# Patient Record
Sex: Female | Born: 1980 | Race: Black or African American | Hispanic: No | Marital: Single | Smoking: Never smoker
Health system: Southern US, Community
[De-identification: ages and names within clinical notes are randomized; demographics above are authoritative.]

## PROBLEM LIST (undated history)

## (undated) DIAGNOSIS — D219 Benign neoplasm of connective and other soft tissue, unspecified: Secondary | ICD-10-CM

## (undated) DIAGNOSIS — F419 Anxiety disorder, unspecified: Secondary | ICD-10-CM

## (undated) DIAGNOSIS — I10 Essential (primary) hypertension: Secondary | ICD-10-CM

## (undated) DIAGNOSIS — E119 Type 2 diabetes mellitus without complications: Secondary | ICD-10-CM

## (undated) DIAGNOSIS — F329 Major depressive disorder, single episode, unspecified: Secondary | ICD-10-CM

## (undated) DIAGNOSIS — N939 Abnormal uterine and vaginal bleeding, unspecified: Secondary | ICD-10-CM

## (undated) HISTORY — DX: Anxiety disorder, unspecified: F41.9

## (undated) HISTORY — DX: Essential (primary) hypertension: I10

## (undated) HISTORY — DX: Abnormal uterine and vaginal bleeding, unspecified: N93.9

## (undated) HISTORY — DX: Benign neoplasm of connective and other soft tissue, unspecified: D21.9

---

## 2002-10-04 ENCOUNTER — Emergency Department (HOSPITAL_COMMUNITY): Admission: EM | Admit: 2002-10-04 | Discharge: 2002-10-05 | Payer: Self-pay | Admitting: Emergency Medicine

## 2002-10-05 ENCOUNTER — Encounter: Payer: Self-pay | Admitting: Emergency Medicine

## 2004-03-01 ENCOUNTER — Emergency Department (HOSPITAL_COMMUNITY): Admission: EM | Admit: 2004-03-01 | Discharge: 2004-03-01 | Payer: Self-pay | Admitting: Emergency Medicine

## 2008-09-22 ENCOUNTER — Emergency Department (HOSPITAL_COMMUNITY): Admission: EM | Admit: 2008-09-22 | Discharge: 2008-09-22 | Payer: Self-pay | Admitting: Emergency Medicine

## 2009-05-22 ENCOUNTER — Emergency Department (HOSPITAL_COMMUNITY): Admission: EM | Admit: 2009-05-22 | Discharge: 2009-05-22 | Payer: Self-pay | Admitting: Emergency Medicine

## 2010-03-10 ENCOUNTER — Emergency Department (HOSPITAL_COMMUNITY): Admission: EM | Admit: 2010-03-10 | Discharge: 2010-03-10 | Payer: Self-pay | Admitting: Emergency Medicine

## 2010-09-20 LAB — URINALYSIS, ROUTINE W REFLEX MICROSCOPIC
Glucose, UA: NEGATIVE mg/dL
Ketones, ur: NEGATIVE mg/dL
Nitrite: NEGATIVE
Protein, ur: NEGATIVE mg/dL

## 2010-09-20 LAB — ETHANOL: Alcohol, Ethyl (B): <5 mg/dL (ref 0–10)

## 2010-09-20 LAB — DIFFERENTIAL
Basophils Relative: 1 % (ref 0–1)
Eosinophils Absolute: 0.5 10*3/uL (ref 0.0–0.7)
Lymphs Abs: 2.3 10*3/uL (ref 0.7–4.0)
Monocytes Absolute: 0.4 10*3/uL (ref 0.1–1.0)
Monocytes Relative: 3 % (ref 3–12)
Neutro Abs: 7.9 10*3/uL — ABNORMAL HIGH (ref 1.7–7.7)

## 2010-09-20 LAB — COMPREHENSIVE METABOLIC PANEL
ALT: 18 U/L (ref 0–35)
Albumin: 3.8 g/dL (ref 3.5–5.2)
Alkaline Phosphatase: 80 U/L (ref 39–117)
Calcium: 9 mg/dL (ref 8.4–10.5)
Potassium: 3.8 mEq/L (ref 3.5–5.1)
Sodium: 140 mEq/L (ref 135–145)
Total Protein: 7.1 g/dL (ref 6.0–8.3)

## 2010-09-20 LAB — RAPID URINE DRUG SCREEN, HOSP PERFORMED
Amphetamines: NOT DETECTED
Benzodiazepines: NOT DETECTED
Cocaine: NOT DETECTED
Tetrahydrocannabinol: NOT DETECTED

## 2010-09-20 LAB — URINE MICROSCOPIC-ADD ON

## 2010-09-20 LAB — CK TOTAL AND CKMB (NOT AT ARMC): CK, MB: 1.1 ng/mL (ref 0.3–4.0)

## 2010-09-20 LAB — TROPONIN I: Troponin I: 0.01 ng/mL (ref 0.00–0.06)

## 2010-09-20 LAB — CBC
Platelets: 319 10*3/uL (ref 150–400)
RDW: 15.1 % (ref 11.5–15.5)

## 2010-09-20 LAB — POCT PREGNANCY, URINE: Preg Test, Ur: NEGATIVE

## 2011-04-16 ENCOUNTER — Encounter: Payer: Self-pay | Admitting: *Deleted

## 2011-04-16 ENCOUNTER — Emergency Department (HOSPITAL_COMMUNITY): Payer: PRIVATE HEALTH INSURANCE

## 2011-04-16 ENCOUNTER — Emergency Department (HOSPITAL_COMMUNITY)
Admission: EM | Admit: 2011-04-16 | Discharge: 2011-04-16 | Disposition: A | Discharge status: Home / Self Care | Payer: PRIVATE HEALTH INSURANCE | Attending: Emergency Medicine | Admitting: Emergency Medicine

## 2011-04-16 DIAGNOSIS — K5900 Constipation, unspecified: Secondary | ICD-10-CM | POA: Insufficient documentation

## 2011-04-16 DIAGNOSIS — K7689 Other specified diseases of liver: Secondary | ICD-10-CM | POA: Insufficient documentation

## 2011-04-16 DIAGNOSIS — R109 Unspecified abdominal pain: Secondary | ICD-10-CM | POA: Insufficient documentation

## 2011-04-16 DIAGNOSIS — R10819 Abdominal tenderness, unspecified site: Secondary | ICD-10-CM | POA: Insufficient documentation

## 2011-04-16 LAB — COMPREHENSIVE METABOLIC PANEL
ALT: 14 U/L (ref 0–35)
AST: 19 U/L (ref 0–37)
CO2: 25 mEq/L (ref 19–32)
GFR calc non Af Amer: 81 mL/min — ABNORMAL LOW (ref >90)
Potassium: 4.3 mEq/L (ref 3.5–5.1)
Total Bilirubin: 0.2 mg/dL — ABNORMAL LOW (ref 0.3–1.2)

## 2011-04-16 LAB — DIFFERENTIAL
Basophils Absolute: 0 10*3/uL (ref 0.0–0.1)
Eosinophils Absolute: 0.2 10*3/uL (ref 0.0–0.7)
Lymphs Abs: 3.5 10*3/uL (ref 0.7–4.0)
Monocytes Absolute: 0.7 10*3/uL (ref 0.1–1.0)
Monocytes Relative: 6 % (ref 3–12)
Neutro Abs: 7.1 10*3/uL (ref 1.7–7.7)

## 2011-04-16 LAB — CBC
HCT: 37.5 % (ref 36.0–46.0)
MCH: 23.8 pg — ABNORMAL LOW (ref 26.0–34.0)
MCHC: 34.7 g/dL (ref 30.0–36.0)
MCV: 68.6 fL — ABNORMAL LOW (ref 78.0–100.0)
Platelets: 310 10*3/uL (ref 150–400)
RDW: 15.5 % (ref 11.5–15.5)
WBC: 11.5 10*3/uL — ABNORMAL HIGH (ref 4.0–10.5)

## 2011-04-16 LAB — URINALYSIS, ROUTINE W REFLEX MICROSCOPIC
Glucose, UA: NEGATIVE mg/dL
Ketones, ur: NEGATIVE mg/dL
pH: 6 (ref 5.0–8.0)

## 2011-04-16 LAB — URINE MICROSCOPIC-ADD ON

## 2011-04-16 MED ORDER — RANITIDINE HCL 150 MG PO CAPS: 150.0000 mg | ORAL_CAPSULE | Freq: Two times a day (BID) | ORAL | Status: DC

## 2011-04-16 NOTE — ED Notes (Signed)
Pt offered pain medication pt states she does not want anything for pain medication at this time. Pt states pain increases with movement pt states she wants to wait on pain medication

## 2011-04-16 NOTE — ED Notes (Signed)
Intermittent burning sensation sometimes worse after eating. Pain was sharper this am, associated with some nausea that made pt come to ED today.

## 2011-04-16 NOTE — ED Notes (Signed)
Pt states she is unable to void at this time. Pt refused in and cath. Pt states she wants to wait to see if she can void on her own

## 2011-04-16 NOTE — ED Provider Notes (Signed)
History     CSN: [id number] Arrival date & time: 04/16/2011  8:47 AM   First MD Initiated Contact with Patient 04/16/11 [phone]      Chief Complaint  Patient presents with  . Abdominal Pain    (Consider location/radiation/quality/duration/timing/severity/associated sxs/prior treatment) Patient is a 30 y.o. female presenting with abdominal pain. The history is provided by the patient (pt complains of abd pain with bloating).  Abdominal Pain The primary symptoms of the illness include abdominal pain. The primary symptoms of the illness do not include fever, fatigue or diarrhea. The current episode started more than 2 days ago. The onset of the illness was gradual. The problem has not changed since onset. The abdominal pain began more than 2 days ago. The pain came on gradually. The abdominal pain has been unchanged since its onset. The abdominal pain does not radiate. The severity of the abdominal pain is 2/10. The abdominal pain is relieved by nothing. The abdominal pain is exacerbated by fatty foods.  The patient states that she believes she is currently not pregnant. The patient has had a change in bowel habit. Additional symptoms associated with the illness include constipation. Symptoms associated with the illness do not include chills, heartburn, hematuria, frequency or back pain. Significant associated medical issues do not include diverticulitis or HIV.    History reviewed. No pertinent past medical history.  History reviewed. No pertinent past surgical history.  No family history on file.  History  Substance Use Topics  . Smoking status: Never Smoker   . Smokeless tobacco: Not on file  . Alcohol Use: No    OB History    Grav Para Term Preterm Abortions TAB SAB Ect Mult Living                  Review of Systems  Constitutional: Negative for fever, chills and fatigue.  HENT: Negative for congestion, sinus pressure and ear discharge.   Eyes: Negative for discharge.    Respiratory: Negative for cough.   Cardiovascular: Negative for chest pain.  Gastrointestinal: Positive for abdominal pain and constipation. Negative for heartburn and diarrhea.  Genitourinary: Negative for frequency and hematuria.  Musculoskeletal: Negative for back pain.  Skin: Negative for rash.  Neurological: Negative for seizures and headaches.  Hematological: Negative.   Psychiatric/Behavioral: Negative for hallucinations.    Allergies  Shellfish allergy  Home Medications   Current Outpatient Rx  Name Route Sig Dispense Refill  . ACETAMINOPHEN 500 MG PO TABS Oral Take 1,000 mg by mouth every 6 (six) hours as needed. For pain     . NAPROXEN SODIUM 220 MG PO TABS Oral Take 220 mg by mouth daily as needed. For pain     . RANITIDINE HCL 150 MG PO CAPS Oral Take 1 capsule (150 mg total) by mouth 2 (two) times daily. 60 capsule 0    BP 137/57  Pulse 99  Temp(Src) 98.6 F (37 C) (Oral)  Resp 16  SpO2 98%  LMP 04/02/2011  Physical Exam  Constitutional: She is oriented to person, place, and time. She appears well-developed.  HENT:  Head: Normocephalic and atraumatic.  Eyes: Conjunctivae and EOM are normal. No scleral icterus.  Neck: Neck supple. No thyromegaly present.  Cardiovascular: Normal rate and regular rhythm.  Exam reveals no gallop and no friction rub.   No murmur heard. Pulmonary/Chest: No stridor. She has no wheezes. She has no rales. She exhibits no tenderness.  Abdominal: She exhibits no distension. There is tenderness. There is no  rebound.       Mild epigastric and ruq tendernous  Musculoskeletal: Normal range of motion. She exhibits no edema.  Lymphadenopathy:    She has no cervical adenopathy.  Neurological: She is oriented to person, place, and time. Coordination normal.  Skin: No rash noted. No erythema.  Psychiatric: She has a normal mood and affect. Her behavior is normal.    ED Course  Procedures (including critical care time)  Labs Reviewed   CBC - Abnormal; Notable for the following:    WBC 11.5 (*)    RBC 5.47 (*)    MCV 68.6 (*)    MCH 23.8 (*)    All other components within normal limits  COMPREHENSIVE METABOLIC PANEL - Abnormal; Notable for the following:    Glucose, Bld 104 (*)    Albumin 3.4 (*)    Total Bilirubin 0.2 (*)    GFR calc non Af Amer 81 (*)    All other components within normal limits  URINALYSIS, ROUTINE W REFLEX MICROSCOPIC - Abnormal; Notable for the following:    Leukocytes, UA SMALL (*)    All other components within normal limits  URINE MICROSCOPIC-ADD ON - Abnormal; Notable for the following:    Squamous Epithelial / LPF FEW (*)    Bacteria, UA MANY (*)    All other components within normal limits  DIFFERENTIAL  LIPASE, BLOOD  PREGNANCY, URINE   US Abdomen Complete  04/16/2011  *RADIOLOGY REPORT*  Clinical Data:  Abdominal pain.  COMPLETE ABDOMINAL ULTRASOUND 04/16/2011:  Comparison:  None.  Findings: Examination was technically difficult due to body habitus.  Gallbladder:  No shadowing gallstones or echogenic sludge.  No gallbladder wall thickening or pericholecystic fluid.  Negative sonographic Murphy's sign according to the ultrasound technologist.  Common bile duct:  Normal in caliber with maximum diameter approximating 5 mm.  Liver:  Scattered areas of focally increased and coarsened echotexture without other focal parenchymal abnormality.  Patent portal vein with hepatopetal flow.  IVC:  Patent in its intrahepatic portion.  Obscured outside the liver by overlying bowel gas.  Pancreas:  Possible mild pancreatic ductal dilation.  Limited visualization of the pancreas, as the head, distal body, and tail were obscured by bowel gas.  Spleen:  Normal size and echotexture without focal parenchymal abnormality.  Right Kidney:  No hydronephrosis.  Well-preserved cortex.  No shadowing calculi.  Normal size and parenchymal echotexture without focal abnormalities.  Approximately 10.8 cm in length.  Left  Kidney:  No hydronephrosis.  Well-preserved cortex.  No shadowing calculi.  Normal size and parenchymal echotexture without focal abnormalities.  Approximately 12.1 cm in length.  Abdominal aorta:  Normal in caliber throughout its visualized course in the abdomen without significant atherosclerosis.  IMPRESSION:  1.  Scattered areas of focal hepatic steatosis. 2.  Normal-appearing gallbladder by ultrasound.  No biliary ductal dilation. 3.  Non-visualization of the pancreatic head, distal body, and tail.  Possible pancreatic ductal dilation.  Original Report Authenticated By: Arnell Sieving, M.D.   Dg Abd Acute W/chest  04/16/2011  *RADIOLOGY REPORT*  Clinical Data: Abdominal pain.  ACUTE ABDOMEN SERIES (ABDOMEN 2 VIEW & CHEST 1 VIEW)  Comparison: None  Findings: The upright chest x-ray is normal.  No acute pulmonary findings.  Two views of the abdomen demonstrate a normal bowel gas pattern. No findings for obstruction or perforation.  The soft tissue shadows are maintained.  No worrisome calcifications.  The bony structures are unremarkable.  IMPRESSION:  1.  No acute cardiopulmonary  findings. 2.  Unremarkable abdominal radiographs.  Original Report Authenticated By: P. Loralie Champagne, M.D.     1. Abdominal pain    Results for orders placed during the hospital encounter of 04/16/11  CBC      Component Value Range   WBC 11.5 (*) 4.0 - 10.5 (K/uL)   RBC 5.47 (*) 3.87 - 5.11 (MIL/uL)   Hemoglobin 13.0  12.0 - 15.0 (g/dL)   HCT 86.5  78.4 - 69.6 (%)   MCV 68.6 (*) 78.0 - 100.0 (fL)   MCH 23.8 (*) 26.0 - 34.0 (pg)   MCHC 34.7  30.0 - 36.0 (g/dL)   RDW 29.5  28.4 - 13.2 (%)   Platelets 310  150 - 400 (K/uL)  DIFFERENTIAL      Component Value Range   Neutrophils Relative 62  43 - 77 (%)   Lymphocytes Relative 30  12 - 46 (%)   Monocytes Relative 6  3 - 12 (%)   Eosinophils Relative 2  0 - 5 (%)   Basophils Relative 0  0 - 1 (%)   Neutro Abs 7.1  1.7 - 7.7 (K/uL)   Lymphs Abs 3.5  0.7 - 4.0  (K/uL)   Monocytes Absolute 0.7  0.1 - 1.0 (K/uL)   Eosinophils Absolute 0.2  0.0 - 0.7 (K/uL)   Basophils Absolute 0.0  0.0 - 0.1 (K/uL)   RBC Morphology POLYCHROMASIA PRESENT     Smear Review PLATELET COUNT CONFIRMED BY SMEAR    COMPREHENSIVE METABOLIC PANEL      Component Value Range   Sodium 137  135 - 145 (mEq/L)   Potassium 4.3  3.5 - 5.1 (mEq/L)   Chloride 103  96 - 112 (mEq/L)   CO2 25  19 - 32 (mEq/L)   Glucose, Bld 104 (*) 70 - 99 (mg/dL)   BUN 17  6 - 23 (mg/dL)   Creatinine, Ser 4.40  0.50 - 1.10 (mg/dL)   Calcium 8.8  8.4 - 10.2 (mg/dL)   Total Protein 7.2  6.0 - 8.3 (g/dL)   Albumin 3.4 (*) 3.5 - 5.2 (g/dL)   AST 19  0 - 37 (U/L)   ALT 14  0 - 35 (U/L)   Alkaline Phosphatase 97  39 - 117 (U/L)   Total Bilirubin 0.2 (*) 0.3 - 1.2 (mg/dL)   GFR calc non Af Amer 81 (*) >90 (mL/min)   GFR calc Af Amer >90  >90 (mL/min)  LIPASE, BLOOD      Component Value Range   Lipase 13  11 - 59 (U/L)  PREGNANCY, URINE      Component Value Range   Preg Test, Ur NEGATIVE    URINALYSIS, ROUTINE W REFLEX MICROSCOPIC      Component Value Range   Color, Urine YELLOW  YELLOW    Appearance CLEAR  CLEAR    Specific Gravity, Urine 1.026  1.005 - 1.030    pH 6.0  5.0 - 8.0    Glucose, UA NEGATIVE  NEGATIVE (mg/dL)   Hgb urine dipstick NEGATIVE  NEGATIVE    Bilirubin Urine NEGATIVE  NEGATIVE    Ketones, ur NEGATIVE  NEGATIVE (mg/dL)   Protein, ur NEGATIVE  NEGATIVE (mg/dL)   Urobilinogen, UA 0.2  0.0 - 1.0 (mg/dL)   Nitrite NEGATIVE  NEGATIVE    Leukocytes, UA SMALL (*) NEGATIVE   URINE MICROSCOPIC-ADD ON      Component Value Range   Squamous Epithelial / LPF FEW (*) RARE    WBC, UA  7-10  <3 (WBC/hpf)   Bacteria, UA MANY (*) RARE    US Abdomen Complete  04/16/2011  *RADIOLOGY REPORT*  Clinical Data:  Abdominal pain.  COMPLETE ABDOMINAL ULTRASOUND 04/16/2011:  Comparison:  None.  Findings: Examination was technically difficult due to body habitus.  Gallbladder:  No shadowing  gallstones or echogenic sludge.  No gallbladder wall thickening or pericholecystic fluid.  Negative sonographic Murphy's sign according to the ultrasound technologist.  Common bile duct:  Normal in caliber with maximum diameter approximating 5 mm.  Liver:  Scattered areas of focally increased and coarsened echotexture without other focal parenchymal abnormality.  Patent portal vein with hepatopetal flow.  IVC:  Patent in its intrahepatic portion.  Obscured outside the liver by overlying bowel gas.  Pancreas:  Possible mild pancreatic ductal dilation.  Limited visualization of the pancreas, as the head, distal body, and tail were obscured by bowel gas.  Spleen:  Normal size and echotexture without focal parenchymal abnormality.  Right Kidney:  No hydronephrosis.  Well-preserved cortex.  No shadowing calculi.  Normal size and parenchymal echotexture without focal abnormalities.  Approximately 10.8 cm in length.  Left Kidney:  No hydronephrosis.  Well-preserved cortex.  No shadowing calculi.  Normal size and parenchymal echotexture without focal abnormalities.  Approximately 12.1 cm in length.  Abdominal aorta:  Normal in caliber throughout its visualized course in the abdomen without significant atherosclerosis.  IMPRESSION:  1.  Scattered areas of focal hepatic steatosis. 2.  Normal-appearing gallbladder by ultrasound.  No biliary ductal dilation. 3.  Non-visualization of the pancreatic head, distal body, and tail.  Possible pancreatic ductal dilation.  Original Report Authenticated By: Arnell Sieving, M.D.   Dg Abd Acute W/chest  04/16/2011  *RADIOLOGY REPORT*  Clinical Data: Abdominal pain.  ACUTE ABDOMEN SERIES (ABDOMEN 2 VIEW & CHEST 1 VIEW)  Comparison: None  Findings: The upright chest x-ray is normal.  No acute pulmonary findings.  Two views of the abdomen demonstrate a normal bowel gas pattern. No findings for obstruction or perforation.  The soft tissue shadows are maintained.  No worrisome  calcifications.  The bony structures are unremarkable.  IMPRESSION:  1.  No acute cardiopulmonary findings. 2.  Unremarkable abdominal radiographs.  Original Report Authenticated By: P. Loralie Champagne, M.D.       MDM  abd pain.  Pud,  Irritable bowel,  stress        Benny Lennert, MD 04/17/11 [phone]

## 2011-04-16 NOTE — ED Notes (Signed)
Pt states she is having trouble having bowel movements

## 2011-04-16 NOTE — ED Notes (Signed)
Ultrasound called to confrim pt's abd ultrasound. Ultrasound is behind due to biospy being performed at this time

## 2011-04-16 NOTE — ED Notes (Signed)
Pt remains unable to urinate.  

## 2011-09-24 ENCOUNTER — Emergency Department (HOSPITAL_COMMUNITY): Payer: PRIVATE HEALTH INSURANCE

## 2011-09-24 ENCOUNTER — Emergency Department (HOSPITAL_COMMUNITY)
Admission: EM | Admit: 2011-09-24 | Discharge: 2011-09-24 | Disposition: A | Discharge status: Home / Self Care | Payer: PRIVATE HEALTH INSURANCE | Attending: Emergency Medicine | Admitting: Emergency Medicine

## 2011-09-24 ENCOUNTER — Encounter (HOSPITAL_COMMUNITY): Payer: Self-pay | Admitting: *Deleted

## 2011-09-24 DIAGNOSIS — R110 Nausea: Secondary | ICD-10-CM | POA: Insufficient documentation

## 2011-09-24 DIAGNOSIS — H539 Unspecified visual disturbance: Secondary | ICD-10-CM | POA: Insufficient documentation

## 2011-09-24 DIAGNOSIS — E119 Type 2 diabetes mellitus without complications: Secondary | ICD-10-CM | POA: Insufficient documentation

## 2011-09-24 DIAGNOSIS — R51 Headache: Secondary | ICD-10-CM | POA: Insufficient documentation

## 2011-09-24 DIAGNOSIS — R42 Dizziness and giddiness: Secondary | ICD-10-CM | POA: Insufficient documentation

## 2011-09-24 DIAGNOSIS — R350 Frequency of micturition: Secondary | ICD-10-CM | POA: Insufficient documentation

## 2011-09-24 HISTORY — DX: Major depressive disorder, single episode, unspecified: F32.9

## 2011-09-24 LAB — GLUCOSE, CAPILLARY
Glucose-Capillary: 287 mg/dL — ABNORMAL HIGH (ref 70–99)
Glucose-Capillary: 286 mg/dL — ABNORMAL HIGH (ref 70–99)

## 2011-09-24 LAB — URINALYSIS, ROUTINE W REFLEX MICROSCOPIC
Glucose, UA: 500 mg/dL — AB
Leukocytes, UA: NEGATIVE
Nitrite: NEGATIVE
Protein, ur: NEGATIVE mg/dL
Urobilinogen, UA: 0.2 mg/dL (ref 0.0–1.0)

## 2011-09-24 LAB — BASIC METABOLIC PANEL
CO2: 23 mEq/L (ref 19–32)
Calcium: 8.8 mg/dL (ref 8.4–10.5)
GFR calc Af Amer: >90 mL/min (ref >90)
GFR calc non Af Amer: >90 mL/min (ref >90)
Sodium: 135 mEq/L (ref 135–145)

## 2011-09-24 LAB — PREGNANCY, URINE: Preg Test, Ur: NEGATIVE

## 2011-09-24 MED ORDER — SODIUM CHLORIDE 0.9 % IV BOLUS (SEPSIS)
1000.0000 mL | Freq: Once | INTRAVENOUS | Status: AC
  Administered 2011-09-24: 1000 mL via INTRAVENOUS

## 2011-09-24 MED ORDER — LORATADINE 10 MG PO TABS: 10.0000 mg | ORAL_TABLET | Freq: Every day | ORAL | Status: DC

## 2011-09-24 MED ORDER — INSULIN SYRINGES (DISPOSABLE) U-100 0.3 ML MISC: Status: DC

## 2011-09-24 MED ORDER — METFORMIN HCL 500 MG PO TABS: 500.0000 mg | ORAL_TABLET | Freq: Two times a day (BID) | ORAL | Status: DC

## 2011-09-24 MED ORDER — INSULIN GLARGINE 100 UNIT/ML ~~LOC~~ SOLN: 15.0000 [IU] | Freq: Every day | SUBCUTANEOUS | Status: DC

## 2011-09-24 MED ORDER — ONDANSETRON HCL 4 MG/2ML IJ SOLN
4.0000 mg | Freq: Once | INTRAMUSCULAR | Status: AC
  Administered 2011-09-24: 4 mg via INTRAVENOUS
  Filled 2011-09-24: qty 2

## 2011-09-24 MED ORDER — INSULIN PEN NEEDLE 31G X 6 MM MISC: Status: DC

## 2011-09-24 MED ORDER — SODIUM CHLORIDE 0.9 % IV SOLN
INTRAVENOUS | Status: DC
  Administered 2011-09-24: 2.9 [IU]/h via INTRAVENOUS
  Filled 2011-09-24: qty 1

## 2011-09-24 MED ORDER — INSULIN GLARGINE 100 UNIT/ML ~~LOC~~ SOLN
15.0000 [IU] | Freq: Once | SUBCUTANEOUS | Status: AC
  Administered 2011-09-24: 15 [IU] via SUBCUTANEOUS
  Filled 2011-09-24: qty 1

## 2011-09-24 MED ORDER — INSULIN GLARGINE 100 UNIT/ML ~~LOC~~ SOLN: 15.0000 [IU] | Freq: Once | SUBCUTANEOUS | Status: DC

## 2011-09-24 NOTE — ED Notes (Signed)
Pt to CT  Pt alert and oriented x4. Respirations even and unlabored, bilateral symmetrical rise and fall of chest. Skin warm and dry. In no acute distress. Denies needs.   

## 2011-09-24 NOTE — Consult Note (Signed)
Requesting physician: Dr. Nino Parsley  Reason for consultation: Hyperglycemia, newly onset diabetes.  History of Present Illness: 31 y/o female with PMH significant for obesity and first degree relative with diabetes; come to the hospital with complaints of polyurea, polydipsia and polyphagya; also with blurred vision and some nausea. Patient reports that she also has had symptoms of allergic rhinitis flaring with weather changes, including HA's, rhinorrhea and sneezing. Patient in the ED was found to have elevated blood sugar in the 417 range initially and a UA with 500 glucose in urine and 15 ketones. Patient BMET except for elevated blood sugar was completely normal; no anion gap, normal bicarb and no electrolytes abnormalities. CT of head was also ordered and w/o acute intracranial abnormalities.  At this point Patient has received IVF's and is no complaining of any nausea or vomiting; she reports been able to keep herself hydrated at home.  Patient also with [employer] insurance that will facilitate outpatient care and treatment.  Allergies:   Allergies  Allergen Reactions  . Shellfish Allergy Itching, Nausea And Vomiting and Swelling      Past Medical History  Diagnosis Date  . Depression     History reviewed. No pertinent past surgical history.  Scheduled Meds:   . ondansetron  4 mg Intravenous Once  . sodium chloride  1,000 mL Intravenous Once   Continuous Infusions:   . insulin (NOVOLIN-R) infusion 2.9 Units/hr (09/24/11 1211)   PRN Meds:.  Social History:  reports that she has never smoked. She has never used smokeless tobacco. She reports that she does not drink alcohol or use illicit drugs.  Family History: Mother with diabetes.  Review of Systems:   Physical Exam: Blood pressure 125/80, pulse 109, temperature 98.2 F (36.8 C), temperature source Oral, resp. rate 19, last menstrual period 09/10/2011, SpO2 96.00%. Constitutional: She is oriented to person,  place, and time. No distress. Obese and well hydrated. HENT:  Head: Normocephalic and atraumatic.  Eyes: Conjunctivae and EOM are normal. Pupils are equal, round, and reactive to light. Mild conjunctiva injection; also reports some itching. No papilledema  Cardiovascular: Regular rhythm. No murmurs, no gallops or rubs. Mild tachycardia on arrival; after IVF's HR in the 89 range. Pulmonary/Chest: Effort normal. No respiratory distress. She has no wheezes. She has no rales.  Abdominal: Soft. She exhibits no distension. There is no tenderness.  Musculoskeletal: Normal range of motion. She exhibits no edema.  Neurological: She is alert and oriented to person, place, and time. No cranial nerve deficit. Coordination normal. Strength 5 over 5 in bilateral upper and lower extremities  Skin: Skin is warm and dry.  Psychiatric: She has a normal mood and affect. Thought content normal.    Labs on Admission:  Results for orders placed during the hospital encounter of 09/24/11 (from the past 48 hour(s))  URINALYSIS, ROUTINE W REFLEX MICROSCOPIC     Status: Abnormal   Collection Time   09/24/11  9:36 AM      Component Value Range Comment   Color, Urine YELLOW  YELLOW     APPearance CLOUDY (*) CLEAR     Specific Gravity, Urine 1.044 (*) 1.005 - 1.030     pH 6.5  5.0 - 8.0     Glucose, UA 500 (*) NEGATIVE (mg/dL)    Hgb urine dipstick NEGATIVE  NEGATIVE     Bilirubin Urine NEGATIVE  NEGATIVE     Ketones, ur 15 (*) NEGATIVE (mg/dL)    Protein, ur NEGATIVE  NEGATIVE (mg/dL)  Urobilinogen, UA 0.2  0.0 - 1.0 (mg/dL)    Nitrite NEGATIVE  NEGATIVE     Leukocytes, UA NEGATIVE  NEGATIVE  MICROSCOPIC NOT DONE ON URINES WITH NEGATIVE PROTEIN, BLOOD, LEUKOCYTES, NITRITE, OR GLUCOSE <1000 mg/dL.  PREGNANCY, URINE     Status: Normal   Collection Time   09/24/11  9:36 AM      Component Value Range Comment   Preg Test, Ur NEGATIVE  NEGATIVE    BASIC METABOLIC PANEL     Status: Abnormal   Collection Time    09/24/11  9:45 AM      Component Value Range Comment   Sodium 135  135 - 145 (mEq/L)    Potassium 4.5  3.5 - 5.1 (mEq/L) HEMOLYSIS AT THIS LEVEL MAY AFFECT RESULT   Chloride 98  96 - 112 (mEq/L)    CO2 23  19 - 32 (mEq/L)    Glucose, Bld 417 (*) 70 - 99 (mg/dL)    BUN 10  6 - 23 (mg/dL)    Creatinine, Ser 1.61  0.50 - 1.10 (mg/dL)    Calcium 8.8  8.4 - 10.5 (mg/dL)    GFR calc non Af Amer >90  >90 (mL/min)    GFR calc Af Amer >90  >90 (mL/min)   GLUCOSE, CAPILLARY     Status: Abnormal   Collection Time   09/24/11 11:51 AM      Component Value Range Comment   Glucose-Capillary 353 (*) 70 - 99 (mg/dL)    Comment 1 Documented in Chart       Radiological Exams on Admission: Ct Head Wo Contrast  09/24/2011  *RADIOLOGY REPORT*  Clinical Data: Headache, dizziness, blurred vision  CT HEAD WITHOUT CONTRAST  Technique:  Contiguous axial images were obtained from the base of the skull through the vertex without contrast.  Comparison: 03/01/2004  Findings: No intracranial hemorrhage, mass effect or midline shift. No hydrocephalus.  The gray and white matter differentiation is preserved.  No skull fracture.  Paranasal sinuses and mastoid air cells are unremarkable.  No acute infarction.  No mass lesion is noted on this unenhanced scan.  No intra or extra-axial fluid collection.  IMPRESSION: No acute intracranial abnormality.  Original Report Authenticated By: Natasha Mead, M.D.    Assessment/Plan 1-DM: newly diagnosed; with hyperglycemia but no other metabolic derangements. At this point will recommend metformin BID, low sugar diet and lantus [place] 15 units QHS. Patient will also need a glucometer and lifestyle changes (weight loss). Patient instructed to keep herself hydrated and to arrange visit with ophthalmology.  2-Allergic rhinitis: will treat with loratadine 10mg  daily.  3-Obesity: low calorie diet and exercise  4-Mild dehydration: IVF given in ED; patient w/o any nausea and capable of keeping  herself hydrated at home; now that blood sugar will be control.  Dispo: will d/c home on metformin, lantus and arranged visit to follow a establish care with a PCP.   Time Spent on Admission: 45 minutes.  Roza Creamer [facility] [phone]  09/24/2011, 1:04 PM

## 2011-09-24 NOTE — ED Notes (Signed)
Pt is waiting for social services to give doses of medicine to pt. William from pharmacy is working on the process. Once pt gets all of her resources then pt can be discharged home.

## 2011-09-24 NOTE — Progress Notes (Signed)
ED CM spoke Patricia Bell at with [phone] at 1530 to get assist with pt co pay for lantus but found out pt is not covered by [employer] this semester because she is only taking online courses.  Pt was covered last semester.  Pt now self pay. [facility] confirms pt is eligible for [facility] indigent program.  Will in [place] pharmacy assisted with services [facility] does not carry insulin pens Quoted cost [phone] for a vial $273 for box of pens for self pay patients.  CM printed and filled out [employer] application for lantus for pt Faxed to [phone] CM provided written information and reviewed with pt list of [employer] internal medicine mds, endocrinologist, ophthamologists, [employer] application, uninsured pcps, discount pharmacies, health department med and dm programs, [facility] connect, [facility], [email or web address], nutritional portion sizes, low carbohydrate diet,  how to draw up insulin into syringes, contact information to verify her [employer] re instatement and her October 02 2011 10 am pcp appt at [facility]. Pt voiced understanding and appreciation Pending processing of lantus insulin Boyfriend now at bedside Pt needs this pm dose of lantus prior to d/c

## 2011-09-24 NOTE — Discharge Instructions (Signed)
Blood Sugar Monitoring, Adult GLUCOSE METERS FOR SELF-MONITORING OF BLOOD GLUCOSE  It is important to be able to correctly measure your blood sugar (glucose). You can use a blood glucose monitor (a small battery-operated device) to check your glucose level at any time. This allows you and your caregiver to monitor your diabetes and to determine how well your treatment plan is working. The process of monitoring your blood glucose with a glucose meter is called self-monitoring of blood glucose (SMBG). When people with diabetes control their blood sugar, they have better health. To test for glucose with a typical glucose meter, place the disposable strip in the meter. Then place a small sample of blood on the "test strip." The test strip is coated with chemicals that combine with glucose in blood. The meter measures how much glucose is present. The meter displays the glucose level as a number. Several new models can record and store a number of test results. Some models can connect to personal computers to store test results or print them out.  Newer meters are often easier to use than older models. Some meters allow you to get blood from places other than your fingertip. Some new models have automatic timing, error codes, signals, or barcode readers to help with proper adjustment (calibration). Some meters have a large display screen or spoken instructions for people with visual impairments.  INSTRUCTIONS FOR USING GLUCOSE METERS  Wash your hands with soap and warm water, or clean the area with alcohol. Dry your hands completely.   Prick the side of your fingertip with a lancet (a sharp-pointed tool used by hand).   Hold the hand down and gently milk the finger until a small drop of blood appears. Catch the blood with the test strip.   Follow the instructions for inserting the test strip and using the SMBG meter. Most meters require the meter to be turned on and the test strip to be inserted before  applying the blood sample.   Record the test result.   Read the instructions carefully for both the meter and the test strips that go with it. Meter instructions are found in the user manual. Keep this manual to help you solve any problems that may arise. Many meters use "error codes" when there is a problem with the meter, the test strip, or the blood sample on the strip. You will need the manual to understand these error codes and fix the problem.   New devices are available such as laser lancets and meters that can test blood taken from "alternative sites" of the body, other than fingertips. However, you should use standard fingertip testing if your glucose changes rapidly. Also, use standard testing if:   You have eaten, exercised, or taken insulin in the past 2 hours.   You think your glucose is low.   You tend to not feel symptoms of low blood glucose (hypoglycemia).   You are ill or under stress.   Clean the meter as directed by the manufacturer.   Test the meter for accuracy as directed by the manufacturer.   Take your meter with you to your caregiver's office. This way, you can test your glucose in front of your caregiver to make sure you are using the meter correctly. Your caregiver can also take a sample of blood to test using a routine lab method. If values on the glucose meter are close to the lab results, you and your caregiver will see that your meter is working well  and you are using good technique. Your caregiver will advise you about what to do if the results do not match.  FREQUENCY OF TESTING  Your caregiver will tell you how often you should check your blood glucose. This will depend on your type of diabetes, your current level of diabetes control, and your types of medicines. The following are general guidelines, but your care plan may be different. Record all your readings and the time of day you took them for review with your caregiver.   Diabetes type 1.   When you  are using insulin with good diabetic control (either multiple daily injections or via a pump), you should check your glucose 4 times a day.   If your diabetes is not well controlled, you may need to monitor more frequently, including before meals and 2 hours after meals, at bedtime, and occasionally between 2 a.m. and 3 a.m.   You should always check your glucose before a dose of insulin or before changing the rate on your insulin pump.   Diabetes type 2.   Guidelines for SMBG in diabetes type 2 are not as well defined.   If you are on insulin, follow the guidelines above.   If you are on medicines, but not insulin, and your glucose is not well controlled, you should test at least twice daily.   If you are not on insulin, and your diabetes is controlled with medicines or diet alone, you should test at least once daily, usually before breakfast.   A weekly profile will help your caregiver advise you on your care plan. The week before your visit, check your glucose before a meal and 2 hours after a meal at least daily. You may want to test before and after a different meal each day so you and your caregiver can tell how well controlled your blood sugars are throughout the course of a 24 hour period.   Gestational diabetes (diabetes during pregnancy).   Frequent testing is often necessary. Accurate timing is important.   If you are not on insulin, check your glucose 4 times a day. Check it before breakfast and 1 hour after the start of each meal.   If you are on insulin, check your glucose 6 times a day. Check it before each meal and 1 hour after the first bite of each meal.   General guidelines.   More frequent testing is required at the start of insulin treatment. Your caregiver will instruct you.   Test your glucose any time you suspect you have low blood sugar (hypoglycemia).   You should test more often when you change medicines, when you have unusual stress or illness, or in other  unusual circumstances.  OTHER THINGS TO KNOW ABOUT GLUCOSE METERS  Measurement Range. Most glucose meters are able to read glucose levels over a broad range of values from as low as 0 to as high as 600 mg/dL. If you get an extremely high or low reading from your meter, you should first confirm it with another reading. Report very high or very low readings to your caregiver.   Whole Blood Glucose versus Plasma Glucose. Some older home glucose meters measure glucose in your whole blood. In a lab or when using some newer home glucose meters, the glucose is measured in your plasma (one component of blood). The difference can be important. It is important for you and your caregiver to know whether your meter gives its results as "whole blood equivalent" or "plasma  equivalent."   Display of High and Low Glucose Values. Part of learning how to operate a meter is understanding what the meter results mean. Know how high and low glucose concentrations are displayed on your meter.   Factors that Affect Glucose Meter Performance. The accuracy of your test results depends on many factors and varies depending on the brand and type of meter. These factors include:   Low red blood cell count (anemia).   Substances in your blood (such as uric acid, vitamin C, and others).   Environmental factors (temperature, humidity, altitude).   Name-brand versus generic test strips.   Calibration. Make sure your meter is set up properly. It is a good idea to do a calibration test with a control solution recommended by the manufacturer of your meter whenever you begin using a fresh bottle of test strips. This will help verify the accuracy of your meter.   Improperly stored, expired, or defective test strips. Keep your strips in a dry place with the lid on.   Soiled meter.   Inadequate blood sample.  NEW TECHNOLOGIES FOR GLUCOSE TESTING Alternative site testing Some glucose meters allow testing blood from alternative  sites. These include the:  Upper arm.   Forearm.   Base of the thumb.   Thigh.  Sampling blood from alternative sites may be desirable. However, it may have some limitations. Blood in the fingertips show changes in glucose levels more quickly than blood in other parts of the body. This means that alternative site test results may be different from fingertip test results, not because of the meter's ability to test accurately, but because the actual glucose concentration can be different.  Continuous Glucose Monitoring Devices to measure your blood glucose continuously are available, and others are in development. These methods can be more expensive than self-monitoring with a glucose meter. However, it is uncertain how effective and reliable these devices are. Your caregiver will advise you if this approach makes sense for you. IF BLOOD SUGARS ARE CONTROLLED, PEOPLE WITH DIABETES REMAIN HEALTHIER.  SMBG is an important part of the treatment plan of patients with diabetes mellitus. Below are reasons for using SMBG:   It confirms that your glucose is at a specific, healthy level.   It detects hypoglycemia and severe hyperglycemia.   It allows you and your caregiver to make adjustments in response to changes in lifestyle for individuals requiring medicine.   It determines the need for starting insulin therapy in temporary diabetes that happens during pregnancy (gestational diabetes).  Document Released: 05/31/2003 Document Revised: 05/17/2011 Document Reviewed: 09/21/2010 [facility] Patient Information 2012 [place], [place].Diabetes Meal Planning Guide The diabetes meal planning guide is a tool to help you plan your meals and snacks. It is important for people with diabetes to manage their blood glucose (sugar) levels. Choosing the right foods and the right amounts throughout your day will help control your blood glucose. Eating right can even help you improve your blood pressure and reach or  maintain a healthy weight. CARBOHYDRATE COUNTING MADE EASY When you eat carbohydrates, they turn to sugar. This raises your blood glucose level. Counting carbohydrates can help you control this level so you feel better. When you plan your meals by counting carbohydrates, you can have more flexibility in what you eat and balance your medicine with your food intake. Carbohydrate counting simply means adding up the total amount of carbohydrate grams in your meals and snacks. Try to eat about the same amount at each meal. Foods with  carbohydrates are listed below. Each portion below is 1 carbohydrate serving or 15 grams of carbohydrates. Ask your dietician how many grams of carbohydrates you should eat at each meal or snack. Grains and Starches  1 slice bread.    English muffin or hotdog/hamburger bun.    cup cold cereal (unsweetened).   ? cup cooked pasta or rice.    cup starchy vegetables (corn, potatoes, peas, beans, winter squash).   1 tortilla (6 inches).    bagel.   1 waffle or pancake (size of a CD).    cup cooked cereal.   4 to 6 small crackers.  *Whole grain is recommended. Fruit  1 cup fresh unsweetened berries, melon, papaya, pineapple.   1 small fresh fruit.    banana or mango.    cup fruit juice (4 oz unsweetened).    cup canned fruit in natural juice or water.   2 tbs dried fruit.   12 to 15 grapes or cherries.  Milk and Yogurt  1 cup fat-free or 1% milk.   1 cup soy milk.   6 oz light yogurt with sugar-free sweetener.   6 oz low-fat soy yogurt.   6 oz plain yogurt.  Vegetables  1 cup raw or  cup cooked is counted as 0 carbohydrates or a "free" food.   If you eat 3 or more servings at 1 meal, count them as 1 carbohydrate serving.  Other Carbohydrates   oz chips or pretzels.    cup ice cream or frozen yogurt.    cup sherbet or sorbet.   2 inch square cake, no frosting.   1 tbs honey, sugar, jam, jelly, or syrup.   2 small  cookies.   3 squares of graham crackers.   3 cups popcorn.   6 crackers.   1 cup broth-based soup.   Count 1 cup casserole or other mixed foods as 2 carbohydrate servings.   Foods with less than 20 calories in a serving may be counted as 0 carbohydrates or a "free" food.  You may want to purchase a book or computer software that lists the carbohydrate gram counts of different foods. In addition, the nutrition facts panel on the labels of the foods you eat are a good source of this information. The label will tell you how big the serving size is and the total number of carbohydrate grams you will be eating per serving. Divide this number by 15 to obtain the number of carbohydrate servings in a portion. Remember, 1 carbohydrate serving equals 15 grams of carbohydrate. SERVING SIZES Measuring foods and serving sizes helps you make sure you are getting the right amount of food. The list below tells how big or small some common serving sizes are.  1 oz.........4 stacked dice.   3 oz........Marland KitchenDeck of cards.   1 tsp.......Marland KitchenTip of little finger.   1 tbs......Marland KitchenMarland KitchenThumb.   2 tbs.......Marland KitchenGolf ball.    cup......Marland KitchenHalf of a fist.   1 cup.......Marland KitchenA fist.  SAMPLE DIABETES MEAL PLAN Below is a sample meal plan that includes foods from the grain and starches, dairy, vegetable, fruit, and meat groups. A dietician can individualize a meal plan to fit your calorie needs and tell you the number of servings needed from each food group. However, controlling the total amount of carbohydrates in your meal or snack is more important than making sure you include all of the food groups at every meal. You may interchange carbohydrate containing foods (dairy, starches, and fruits). The meal plan  below is an example of a 2000 calorie diet using carbohydrate counting. This meal plan has 17 carbohydrate servings. Breakfast  1 cup oatmeal (2 carb servings).    cup light yogurt (1 carb serving).   1 cup blueberries  (1 carb serving).    cup almonds.  Snack  1 large apple (2 carb servings).   1 low-fat string cheese stick.  Lunch  Chicken breast salad.   1 cup spinach.    cup chopped tomatoes.   2 oz chicken breast, sliced.   2 tbs low-fat [place] dressing.   12 whole-wheat crackers (2 carb servings).   12 to 15 grapes (1 carb serving).   1 cup low-fat milk (1 carb serving).  Snack  1 cup carrots.    cup hummus (1 carb serving).  Dinner  3 oz broiled salmon.   1 cup brown rice (3 carb servings).  Snack  1  cups steamed broccoli (1 carb serving) drizzled with 1 tsp olive oil and lemon juice.   1 cup light pudding (2 carb servings).  DIABETES MEAL PLANNING WORKSHEET Your dietician can use this worksheet to help you decide how many servings of foods and what types of foods are right for you.  BREAKFAST Food Group and Servings / Carb Servings Grain/Starches __________________________________ Dairy __________________________________________ Vegetable ______________________________________ Fruit ___________________________________________ Meat __________________________________________ Fat ____________________________________________ LUNCH Food Group and Servings / Carb Servings Grain/Starches ___________________________________ Dairy ___________________________________________ Fruit ____________________________________________ Meat ___________________________________________ Fat _____________________________________________ Laural Golden Food Group and Servings / Carb Servings Grain/Starches ___________________________________ Dairy ___________________________________________ Fruit ____________________________________________ Meat ___________________________________________ Fat _____________________________________________ SNACKS Food Group and Servings / Carb Servings Grain/Starches ___________________________________ Dairy  ___________________________________________ Vegetable _______________________________________ Fruit ____________________________________________ Meat ___________________________________________ Fat _____________________________________________ DAILY TOTALS Starches _________________________ Vegetable ________________________ Fruit ____________________________ Dairy ____________________________ Meat ____________________________ Fat ______________________________ Document Released: 02/22/2005 Document Revised: 05/17/2011 Document Reviewed: 01/03/2009 [employer] Patient Information 2012 [place], [place].Diabetes and Standards of Medical Care  Diabetes is complicated. You may find that your diabetes team includes a dietitian, nurse, diabetes educator, eye doctor, and more. To help everyone know what is going on and to help you get the care you deserve, the following schedule of care was developed to help keep you on track. Below are the tests, exams, vaccines, medicines, education, and plans you will need. A1c test  Performed at least 2 times a year if you are meeting treatment goals.   Performed 4 times a year if therapy has changed or if you are not meeting therapy/glycemic goals.  Aspirin medicine  Take daily as directed by your caregiver.  Blood pressure test  Performed at every routine medical visit. The goal is less than 130/80 mm/Hg.  Dental exam  Get a dental exam at least 2 times a year.  Dilated eye exam (retinal exam)  Type 1 diabetes: Get an exam within 5 years of diagnosis and then yearly.   Type 2 diabetes: Get an exam at diagnosis and then yearly.  All exams thereafter can be extended to every 2 to 3 years if one or more exams have been normal. Foot care exam  Visual foot exams are performed at every routine medical visit. The exams check for cuts, injuries, or other problems with the feet.   A comprehensive foot exam should be done yearly. This includes visual  inspection as well as assessing foot pulses and testing for loss of sensation.  Kidney function test (urine microalbumin)  Performed once a year.   Type 1 diabetes: The first test is performed 5 years after diagnosis.  Type 2 diabetes: The first test is performed at the time of diagnosis.   A serum creatinine and estimated glomerular filtration rate (eGFR) test is done once a year to tell the level of chronic kidney disease (CKD), if present.  Lipid profile (Cholesterol, HDL, LDL, Triglycerides)  Performed once a year for most people. If at low risk, may be assessed every 2 years.   The goal for LDL is less than 100 mg/dl. If at high risk, the goal is less than 70 mg/dl.   The goal for HDL is higher than 40 mg/dl for men and higher than 50 mg/dl for women.   The goal for triglycerides is less than 150 mg/dl.  Flu vaccine, pneumonia vaccine, and hepatitis B vaccine  The flu vaccine is recommended yearly.   The pneumonia vaccine is generally given once in a lifetime. However, there are some instances where another vaccine is recommended. Check with your caregiver.   The hepatitis B vaccine is also recommended for adults with diabetes.  Diabetes self-management education  Recommended at diagnosis and ongoing as needed.  Treatment plan  Reviewed at every medical visit.  Document Released: 03/25/2009 Document Revised: 05/17/2011 Document Reviewed: 11/28/2010 [facility] Patient Information 2012 [place], [place].Diabetes, Eating Away From Home Sometimes, you might eat in a restaurant or have meals that are prepared by someone else. You can enjoy eating out. However, the portions in restaurants may be much larger than needed. Listed below are some ideas to help you choose foods that will keep your blood glucose (sugar) in better control.  TIPS FOR EATING OUT  Know your meal plan and how many carbohydrate servings you should have at each meal. You may wish to carry a copy of your meal plan  in your purse or wallet. Learn the foods included in each food group.   Make a list of restaurants near you that offer healthy choices. Take a copy of the carry-out menus to see what they offer. Then, you can plan what you will order ahead of time.   Become familiar with serving sizes by practicing them at home using measuring cups and spoons. Once you learn to recognize portion sizes, you will be able to correctly estimate the amount of total carbohydrate you are allowed to eat at the restaurant. Ask for a takeout box if the portion is more than you should have. When your food comes, leave the amount you should have on the plate, and put the rest in the takeout box before you start eating.   Plan ahead if your mealtime will be different from usual. Check with your caregiver to find out how to time meals and medicine if you are taking insulin.   Avoid high-fat foods, such as fried foods, cream sauces, high-fat salad dressings, or any added butter or margarine.   Do not be afraid to ask questions. Ask your server about the portion size, cooking methods, ingredients and if items can be substituted. Restaurants do not list all available items on the menu. You can ask for your main entree to be prepared using skim milk, oil instead of butter or margarine, and without gravy or sauces. Ask your waiter or waitress to serve salad dressings, gravy, sauces, margarine, and sour cream on the side. You can then add the amount your meal plan suggests.   Add more vegetables whenever possible.   Avoid items that are labeled "jumbo," "giant," "deluxe," or "supersized."   You may want to split an entre with someone and  order an extra side salad.   Watch for hidden calories in foods like croutons, bacon, or cheese.   Ask your server to take away the bread basket or chips from your table.   Order a dinner salad as an appetizer.  You can eat most foods served in a restaurant. Some foods are better choices than  others. Breads and Starches  Recommended: All kinds of bread (wheat, rye, white, oatmeal, [place], [place], raisin), hard or soft dinner rolls, frankfurter or hamburger buns, small bagels, small corn or whole-wheat flour tortillas.   Avoid: Frosted or glazed breads, butter rolls, egg or cheese breads, croissants, sweet rolls, pastries, coffee cake, glazed or frosted doughnuts, muffins.  Crackers  Recommended: Animal crackers, graham, rye, saltine, oyster, and matzoth crackers. Bread sticks, melba toast, rusks, pretzels, popcorn (without fat), zwieback toast.   Avoid: High-fat snack crackers or chips. Buttered popcorn.  Cereals  Recommended: Hot and cold cereals. Whole grains such as oatmeal or shredded wheat are good choices.   Avoid: Sugar-coated or granola type cereals.  Potatoes/Pasta/Rice/Beans  Recommended: Order baked, boiled, or mashed potatoes, rice or noodles without added fat, whole beans. Order gravies, butter, margarine, or sauces on the side so you can control the amount you add.   Avoid: Hash browns or fried potatoes. Potatoes, pasta, or rice prepared with cream or cheese sauce. Potato or pasta salads prepared with large amounts of dressing. Fried beans or fried rice.  Vegetables  Recommended: Order steamed, baked, boiled, or stewed vegetables without sauces or extra fat. Ask that sauce be served on the side. If vegetables are not listed on the menu, ask what is available.   Avoid: Vegetables prepared with cream, butter, or cheese sauce. Fried vegetables.  Salad Bars  Recommended: Many of the vegetables at a salad bar are considered "free." Use lemon juice, vinegar, or low-calorie salad dressing (fewer than 20 calories per serving) as "free" dressings for your salad. Look for salad bar ingredients that have no added fat or sugar such as tomatoes, lettuce, cucumbers, broccoli, carrots, onions, and mushrooms.   Avoid: Prepared salads with large amounts of dressing, such as  coleslaw, caesar salad, macaroni salad, bean salad, or carrot salad.  Fruit  Recommended: Eat fresh fruit or fresh fruit salad without added dressing. A salad bar often offers fresh fruit choices, but canned fruit at a restaurant is usually packed in sugar or syrup.   Avoid: Sweetened canned or frozen fruits, plain or sweetened fruit juice. Fruit salads with dressing, sour cream, or sugar added to them.  Meat and Meat Substitutes  Recommended: Order broiled, baked, roasted, or grilled meat, poultry, or fish. Trim off all visible fat. Do not eat the skin of poultry. The size stated on the menu is the raw weight. Meat shrinks by  in cooking (for example, 4 oz raw equals 3 oz cooked meat).   Avoid: Deep-fat fried meat, poultry, or fish. Breaded meats.  Eggs  Recommended: Order soft, hard-cooked, poached, or scrambled eggs. Omelets may be okay, depending on what ingredients are added. Egg substitutes are also a good choice.   Avoid: Fried eggs, eggs prepared with cream or cheese sauce.  Milk  Recommended: Order low-fat or fat-free milk according to your meal plan. Plain, nonfat yogurt or flavored yogurt with no sugar added may be used as a substitute for milk. Soy milk may also be used.   Avoid: Milk shakes or sweetened milk beverages.  Soups and Combination Foods  Recommended: Clear broth or consomm  are "free" foods and may be used as an appetizer. Broth-based soups with fat removed count as a starch serving and are preferred over cream soups. Soups made with beans or split peas may be eaten but count as a starch.   Avoid: Fatty soups, soup made with cream, cheese soup. Combination foods prepared with excessive amounts of fat or with cream or cheese sauces.  Desserts and Sweets  Recommended: Ask for fresh fruit. Sponge or angel food cake without icing, ice milk, no sugar added ice cream, sherbet, or frozen yogurt may fit into your meal plan occasionally.   Avoid: Pastries, puddings,  pies, cakes with icing, custard, gelatin desserts.  Fats and Oils  Recommended: Choose healthy fats such as olive oil, canola oil, or tub margarine, reduced fat or fat-free sour cream, cream cheese, avocado, or nuts.   Avoid: Any fats in excess of your allowed portion. Deep-fried foods or any food with a large amount of fat.  Note: Ask for all fats to be served on the side, and limit your portion sizes according to your meal plan. Document Released: 05/28/2005 Document Revised: 05/17/2011 Document Reviewed: 12/16/2008 [facility] Patient Information 2012 [place], [place].Insulin Treatment in Diabetes Diabetes is a lasting (chronic) disease. It occurs when the body does not properly use the sugar (glucose) that is released from food. Glucose levels are controlled by a hormone called insulin, which is made by your pancreas. Depending on the type of diabetes you have, either:  The pancreas does not make any insulin (type 1 diabetes).   The pancreas makes too little insulin, and the body cannot respond normally to the insulin that is made (type 2 diabetes).  Without insulin, death can occur. Diabetes requires lifelong monitoring and treatment. This document will discuss the role of insulin in your treatment and provide information about insulin.  LIFESTYLE CHOICES Lifestyle choices can affect both the control of your diabetes and your risk of complications from diabetes. Lifestyle choices are critically important in the overall management of diabetes. They are important even if you do not need to take insulin.  Eat a healthy diet. Ask for help if you need it.   Exercise regularly. Ask for help if you need it.   Diet and exercise can help:   Reduce the amount of insulin you need.   Your body to use your insulin more effectively.   Reduce your risk of high blood pressure (or reduce your blood pressure, if it is high).   Reduce your cholesterol level.   Reduce your weight.  Healthy lifestyle  choices play an important role in controlling cardiovascular disease (heart attack, stroke, vascular disease, and others), which is a primary complication of diabetes. For optimal control of diabetes, you must reduce your cardiovascular risks and manage your blood sugar. MEDICATIONS BESIDES INSULIN  Your caregiver may recommend medicines for you in addition to insulin. This will depend on 3 things:   Your diabetes type.   How well insulin alone meets your treatment goals.   Other health factors.  There are different types of medicines that help treat diabetes. The goal is to control your blood sugar the best way possible, which will reduce your risk of complications. Adding other medicines may also reduce the amount of insulin you need.  INSULIN  People with type 1 diabetes must take insulin to stay alive. Their body does not produce it. People with type 2 diabetes might require insulin in addition to, or instead of, other medicines. In either case,  proper use of insulin is critical to control your diabetes.  There are a number of different types of insulin. Usually, you will give yourself injections, though others can be trained to give them to you. Some people have an insulin pump that delivers insulin continuously through a soft, flexible tube (canula) that is placedunder the skin of the abdomen.Other sites including the hips, thighs, or upper arms may also be used.  Using insulin requires that you check your blood sugar several times a day. The exact number of times and time of day to check your blood sugar will vary depending on your type of diabetes, your type of insulin, and treatment goals. Your caregiver will direct you.  Generally, different insulins have different properties. The following is a general guide. Specifics will vary by product, and new products are introduced periodically.   Short-acting insulin starts working quickly (in as little as 5 minutes) and wears off in 3 to 6  hours (sometimes longer). This type of insulin works well when taken before a meal to bring your blood sugar quickly back to normal. There are several different types of short-acting insulin. Some work quickly and others last longer in your system.   Intermediate-acting insulin starts working in 2 hours and wears off after about 10 to18 hours. This insulin will lower your blood sugar for a longer period of time, but will not be as effective in lowering your blood sugar right after a meal.   Long-acting insulin mimics the small amount of insulin that your pancreas usually produces throughout the day. You need to have some insulin present at all times, as it is crucial to the metabolism of brain cells and other cells. Long-acting insulin is meant to be used either once or twice a day. It is usually used in combination with other types of insulin, or in combination with other diabetes medicines.  Discuss the type of insulin you are taking with your caregiver or pharmacist. You will then be aware of when the insulin can be expected to peak and when it will wear off.  Your caregiver will usually have a strategy in mind when treating you with insulin. This will vary with your type of diabetes, your diabetes treatment goals, and your health history. It is important that you understand something about this strategy so you may be a partner in treating your diabetes. Here are some terms you might hear:  Basal insulin. You need to have a small amount of insulin present in your blood at all times. Sometimes oral medicines will be enough. For other people, and especially for people with type 1 diabetes, insulin is needed. Usually, intermediate-acting or long-acting insulin is used once or twice a day to accomplish this.   Prandial (meal-related) insulin. Your blood sugar will rise rapidly after a meal. Short-acting insulin can be used right before the meal to bring your blood sugar back to normal quickly. You might be  instructed to adjust the amount of insulin depending on how much carbohydrate (starch) is in your meal.   Corrective insulin. You might be instructed to check your blood sugar at certain times of the day. You then might use a small amount of short-acting insulin to bring the blood sugar down to normal if it is elevated.   Tight control (also called intensive therapy). Tight control is keeping your blood sugar as close to your target as possible and keeping it from going too high after meals. People with tight control of their  diabetes may have fewer long-term complications from their diabetes.   Glycohemoglobin (also called glyco, glycosylated hemoglobin, Hemoglobin A1c, or A1c) level. This measures how well your blood sugar has been controlled during the past 1 to 3 months. It helps your caregiver see how effective your treatment is and decide if any changes are needed. Your caregiver will discuss your target glycohemoglobin with you.  Insulin treatment requires your careful attention. Treatment plans will be different for different people. Some people do well with a simple program. Others require more complicated programs with multiple insulin injections daily. You will work with your caregiver to develop the best program for you. Regardless of your insulin treatment plan, you must also do your best on weight control, diet and food choices, exercise, blood pressure control, and cholesterol control.  BENEFITS OF DIABETES TREATMENT  Research shows that optimal treatment of your diabetes will reduce the risk of kidney, eye, and nerve complications. If you have type 1 diabetes, your risk of heart and vascular disease also decreases with good diabetes control. The better you control your blood sugars (and the lower your glycohemoglobin), the lower your risk of complications. RISKS OF INSULIN Although insulin treatment is important, it does have some risks. Insulin treatment may be complex, but it is critical  for maintaining your good health. Frequent follow-up visits with your caregiver, at least early on, are usually needed.  Insulin can cause your blood sugar to go too low (hypoglycemia). This can be a dangerous complication that must be quickly recognized and treated.   Weight gain can occur.   Improper injection technique can cause hypoglycemia, skin injury or irritation, or other problems. You must learn to inject insulin properly.   Other medicines used for diabetes can have other complications. Discuss your medicines and their complications with your caregiver.  GOALS OF DIABETES CARE  The goal of treating your diabetes is to allow you to live as long as you can with as few complications as possible. Several factors help you work towards this goal:  You should achieve a blood sugar as close to normal as possible without causing hypoglycemia. Generally, this means a glycohemoglobin of between 6% and 7%.   You should achieve and maintain an ideal body weight.   You should exercise regularly.   Your blood pressure should be under 130/80.   Your cholesterol should be controlled, with your LDL under 100. Your target may be different depending on your health factors.   You should not smoke.   You should be up to date on immunizations, including influenza and pneumonia.   You should be monitored for eye, kidney, heart, and vascular health regularly. Preventative medicines are sometimes used for these conditions.  Your specific goals may vary, depending on your health factors. You should discuss issues with your caregiver.  HOME CARE INSTRUCTIONS   Do not smoke.   Eat a healthy diet as instructed. Ask for help if you need it.   Exercise regularly. Ask for help if you need it.   Stay up to date on your immunizations.   See your caregiver on a regular basis.   Follow your diabetes care plan carefully. Take medicines and insulin as directed. Check your blood sugar as directed, and  keep track of it in a log. Understand your glycohemoglobin and other diabetes goals. Understand how to detect and treat hypoglycemia.   Follow your care plan for blood pressure and elevated cholesterol if you have these problems.   Do your  blood tests as directed. This is important and helps monitor your diabetes.   Check your feet every night for sores or wounds.   See your eye doctor once a year.   If you have an illness that causes loss of appetite, vomiting, or diarrhea, you should speak with your caregiver about temporary changes in your insulin doses and other medicines.  SEEK MEDICAL CARE IF:  You are having problems keeping your blood sugar at target range.   You are having episodes of hypoglycemia.   You are having side effects from your medicines.   You have symptoms of an illness that are not improving after 3 to 4 days.   You have a sore or wound that is not healing.   You notice a change in vision or a new problem with your vision.   You develop a fever of more than 100.5 F (38.1 C).  SEEK IMMEDIATE MEDICAL CARE IF:   Your blood sugar goes below 70, especially if you have confusion, lightheadedness, or other symptoms.   Your blood sugar is very high (as advised by your caregiver) twice in a row.   An unexplained oral temperature above 102 F (38.9 C) develops.   You pass out.   You have chest pain or trouble breathing.   You have a sudden, severe headache.   You have sudden weakness in one arm or leg.   You have sudden difficulty speaking or swallowing.   You develop vomiting or diarrhea that is getting worse or not improving after 1 day.  Document Released: 08/24/2008 Document Revised: 05/17/2011 Document Reviewed: 09/21/2010 [facility] Patient Information 2012 [place], [place].Diabetes, Type 2 Diabetes is a long-lasting (chronic) disease. In type 2 diabetes, the pancreas does not make enough insulin (a hormone), and the body does not respond normally to  the insulin that is made. This type of diabetes was also previously called adult-onset diabetes. It usually occurs after the age of 57, but it can occur at any age.  CAUSES  Type 2 diabetes happens because the pancreasis not making enough insulin or your body has trouble using the insulin that your pancreas does make properly. SYMPTOMS   Drinking more than usual.   Urinating more than usual.   Blurred vision.   Dry, itchy skin.   Frequent infections.   Feeling more tired than usual (fatigue).  DIAGNOSIS The diagnosis of type 2 diabetes is usually made by one of the following tests:  Fasting blood glucose test. You will not eat for at least 8 hours and then take a blood test.   Random blood glucose test. Your blood glucose (sugar) is checked at any time of the day regardless of when you ate.   Oral glucose tolerance test (OGTT). Your blood glucose is measured after you have not eaten (fasted) and then after you drink a glucose containing beverage.  TREATMENT   Healthy eating.   Exercise.   Medicine, if needed.   Monitoring blood glucose.   Seeing your caregiver regularly.  HOME CARE INSTRUCTIONS   Check your blood glucose at least once a day. More frequent monitoring may be necessary, depending on your medicines and on how well your diabetes is controlled. Your caregiver will advise you.   Take your medicine as directed by your caregiver.   Do not smoke.   Make wise food choices. Ask your caregiver for information. Weight loss can improve your diabetes.   Learn about low blood glucose (hypoglycemia) and how to treat it.  Get your eyes checked regularly.   Have a yearly physical exam. Have your blood pressure checked and your blood and urine tested.   Wear a pendant or bracelet saying that you have diabetes.   Check your feet every night for cuts, sores, blisters, and redness. Let your caregiver know if you have any problems.  SEEK MEDICAL CARE IF:   You have  problems keeping your blood glucose in target range.   You have problems with your medicines.   You have symptoms of an illness that do not improve after 24 hours.   You have a sore or wound that is not healing.   You notice a change in vision or a new problem with your vision.   You have a fever.  MAKE SURE YOU:  Understand these instructions.   Will watch your condition.   Will get help right away if you are not doing well or get worse.  Document Released: 05/28/2005 Document Revised: 05/17/2011 Document Reviewed: 11/13/2010 [facility] Patient Information 2012 [place], [place].

## 2011-09-24 NOTE — Progress Notes (Signed)
At 1605 CM consulted with CM supervisor for approval for indigent lantus

## 2011-09-24 NOTE — ED Notes (Signed)
Blood sugar check 1450, blood sugar 286. Information not crossing over. Dr Gwenlyn Perking will be paged about discharge orders and blood sugar level

## 2011-09-24 NOTE — Progress Notes (Signed)
ED CM contacted at 1330 to assist with d/c planning (medications, pcp appointment) by Dr Gwenlyn Perking.  CM reviewed EPIC notes.  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, [place].

## 2011-09-24 NOTE — ED Provider Notes (Signed)
History     CSN: [id number]  Arrival date & time 09/24/11  0741   First MD Initiated Contact with Patient 09/24/11 [phone]      Chief Complaint  Patient presents with  . Nausea  . Dizziness    (Consider location/radiation/quality/duration/timing/severity/associated sxs/prior treatment) The history is provided by the patient.   the patient is a 31 year old morbidly obese female, with no past medical history, who presents to emergency department complaining of headache, blurred vision, polyuria, polydipsia, and dizziness.  She has had nausea, with no vomiting, or diarrhea.  She has frequent urination, but no dysuria.  She denies fevers, chills, cough, or difficulty breathing.  She is not taking any medications and has no allergies to medications.  History reviewed. No pertinent past medical history.  History reviewed. No pertinent past surgical history.  History reviewed. No pertinent family history.  History  Substance Use Topics  . Smoking status: Never Smoker   . Smokeless tobacco: Not on file  . Alcohol Use: No    OB History    Grav Para Term Preterm Abortions TAB SAB Ect Mult Living                  Review of Systems  Constitutional: Negative for fever, chills and diaphoresis.  Eyes: Positive for visual disturbance.       Blurred vision  Respiratory: Negative for cough and shortness of breath.   Cardiovascular: Negative for chest pain and palpitations.  Gastrointestinal: Positive for nausea. Negative for vomiting, abdominal pain and diarrhea.  Genitourinary: Positive for frequency. Negative for dysuria.  Skin: Negative for rash.  Neurological: Positive for dizziness and headaches.  Psychiatric/Behavioral: Negative for confusion.  All other systems reviewed and are negative.    Allergies  Shellfish allergy  Home Medications   Current Outpatient Rx  Name Route Sig Dispense Refill  . NAPROXEN SODIUM 220 MG PO TABS Oral Take 220 mg by mouth daily as needed. For  pain       BP 125/80  Pulse 114  Temp(Src) 98.2 F (36.8 C) (Oral)  Resp 20  SpO2 94%  LMP 09/10/2011  Physical Exam  Vitals reviewed. Constitutional: She is oriented to person, place, and time. No distress.       Morbidly obese  HENT:  Head: Normocephalic and atraumatic.  Eyes: Conjunctivae and EOM are normal. Pupils are equal, round, and reactive to light.       No papilledema  Cardiovascular: Regular rhythm.   No murmur heard.      Tachycardia  Pulmonary/Chest: Effort normal. No respiratory distress. She has no wheezes. She has no rales.  Abdominal: Soft. She exhibits no distension. There is no tenderness.  Musculoskeletal: Normal range of motion. She exhibits no edema.  Neurological: She is alert and oriented to person, place, and time. No cranial nerve deficit. Coordination normal.       Strength 5 over 5 in bilateral upper and lower extremities  Skin: Skin is warm and dry.  Psychiatric: She has a normal mood and affect. Thought content normal.    ED Course  Procedures (including critical care time)   Labs Reviewed  BASIC METABOLIC PANEL  URINALYSIS, ROUTINE W REFLEX MICROSCOPIC  PREGNANCY, URINE   No results found.   No diagnosis found.  Pt does not have pcp. Will start insulin to tx new dm and admit.   CRITICAL CARE Performed by: Nicholes Stairs   Total critical care time: 30 min  Critical care time was exclusive of separately  billable procedures and treating other patients.  Critical care was necessary to treat or prevent imminent or life-threatening deterioration.  Critical care was time spent personally by me on the following activities: development of treatment plan with patient and/or surrogate as well as nursing, discussions with consultants, evaluation of patient's response to treatment, examination of patient, obtaining history from patient or surrogate, ordering and performing treatments and interventions, ordering and review of laboratory  studies, ordering and review of radiographic studies, pulse oximetry and re-evaluation of patient's condition.  i spoke with the hospitalist.  He will come see pt in the ed.  Pt was seen by [facility] md, and Child psychotherapist.  They discharged the pt after making arrangements for tx and f/u  MDM  Headache New onset dm.        Cheri Guppy, MD 09/24/11 [phone]

## 2011-09-24 NOTE — ED Notes (Addendum)
Pt from home. Comes in with c/o of blurry vision and nausea x5 days. Dizziness x3 days. Urinary frequency, denies dysuria, and difficulty swallowing, "feels like there is something in the right side of my throat." Rn assessed, airway looks clear. Reports blurry vision is constant, dizziness intermittent when standing. Nausea intermittent. Diarrhea present Thurs, Fri, Sat. Denies falls, but today stumbled around house. Pt was able to tell me how many fingers rn was holding up, but could not read welcome sign in room due to letters being blurry. Hand grips +2. Leg pushes slightly weak. Denies pain.

## 2011-09-24 NOTE — ED Notes (Signed)
md at bedside

## 2011-09-24 NOTE — ED Notes (Addendum)
Dr Gwenlyn Perking printed out prescription that pharmacy needed. Prescription tubed to pharmacy. rn will print out avs and discharge pt.  Selena Batten, rn still needs to sign the back of prescription before pt can be discharged.

## 2011-09-24 NOTE — ED Notes (Signed)
Talked to dr Gwenlyn Perking. He states pts blood sugar at 286 is acceptable and pt does not need to be on the glucose stabilizer anymore.

## 2011-09-25 NOTE — Progress Notes (Signed)
Follow up call placed Unable to speak with pt.  Left a voice message at [phone]. No response from [employer] at this time. Pending return call from pt

## 2011-09-25 NOTE — Progress Notes (Signed)
Faxed (with return confirmation) 09/24/11 Consult note, Face sheet and CM notes to [place] at [phone] fro P Orvan Falconer

## 2011-09-26 ENCOUNTER — Telehealth: Payer: Self-pay | Admitting: *Deleted

## 2011-09-26 ENCOUNTER — Encounter: Payer: Self-pay | Admitting: Family

## 2011-09-26 ENCOUNTER — Ambulatory Visit (INDEPENDENT_AMBULATORY_CARE_PROVIDER_SITE_OTHER): Payer: PRIVATE HEALTH INSURANCE | Admitting: Family

## 2011-09-26 VITALS — BP 120/94 | Ht 64.0 in | Wt 278.0 lb

## 2011-09-26 DIAGNOSIS — E669 Obesity, unspecified: Secondary | ICD-10-CM

## 2011-09-26 DIAGNOSIS — E1165 Type 2 diabetes mellitus with hyperglycemia: Secondary | ICD-10-CM

## 2011-09-26 DIAGNOSIS — IMO0002 Reserved for concepts with insufficient information to code with codable children: Secondary | ICD-10-CM

## 2011-09-26 DIAGNOSIS — L02411 Cutaneous abscess of right axilla: Secondary | ICD-10-CM

## 2011-09-26 DIAGNOSIS — IMO0001 Reserved for inherently not codable concepts without codable children: Secondary | ICD-10-CM

## 2011-09-26 MED ORDER — DOXYCYCLINE HYCLATE 100 MG PO TABS: 100.0000 mg | ORAL_TABLET | Freq: Two times a day (BID) | ORAL | Status: AC

## 2011-09-26 NOTE — Progress Notes (Signed)
  Subjective:    Patient ID: Patricia Bell, female    DOB: [birth year], 31 y.o.   MRN: [id number]  HPI Comments: 31 yo obese black female presents for follow up type II diabetes. Was newly diagnosed on Mon and with Type II diabetes.  Was having blurred vision, abdominal pain, nausea, insomnia and weakness when presented to the ED. Was started on metformin 500mg  twice a day and lantus 15 units every night before bedtime. Checking blood sugar at home. Stating feels better now that has started on diabetic medications although does still experience periods of weakness and fatigue.   C/o sorethroat, tightness in throat, sinus drainage started Sun. Denies shortness of breath, cp, fever, chills, or vomiting. C/o nonpainful abcess under right axilla new onset x 2 weeks with mild reddness. Denies drainage.   Diabetes Associated symptoms include fatigue.      Review of Systems  Constitutional: Positive for fatigue. Negative for fever, chills, diaphoresis, activity change, appetite change and unexpected weight change.  Respiratory: Negative.   Cardiovascular: Negative.   Genitourinary: Negative.   Skin: Negative.        Objective:   Physical Exam  Constitutional: She is oriented to person, place, and time. She appears well-developed and well-nourished. No distress.  Cardiovascular: Normal rate, regular rhythm, normal heart sounds and intact distal pulses.  Exam reveals no gallop and no friction rub.   No murmur heard. Pulmonary/Chest: Effort normal and breath sounds normal. No respiratory distress. She has no wheezes. She has no rales. She exhibits no tenderness.  Neurological: She is alert and oriented to person, place, and time.  Skin: Skin is dry. She is not diaphoretic.          Assessment & Plan:  Assessment: Type II diabetes uncontrolled, obesity Plan: Doxycycline, Discussed routine care, eye exam, A1C every 3 months, teaching handouts on diagnosis and treatment provided. RTC in 1  weeks. Discussed diabetic heart healthy diet, BP control, exercise and maintaining healthy lifestyle. Encouraged to write down blood sugar results and bring along to next appt. Increase lantus to 20 units every pm. Labs: TSH, A1C

## 2011-09-26 NOTE — Progress Notes (Signed)
Pt updated about call to [place] with pending response

## 2011-09-26 NOTE — Telephone Encounter (Signed)
Case Manaager Selena Batten) at [facility] would like to speak to [facility] about a pt she is scheduled to see  10/02/2011 as a new pt.  The Case Manager had called pt to check on her and her BSs are still running in the 300s.  She states she sent over records on this pt yesterday.

## 2011-09-26 NOTE — Progress Notes (Signed)
ED CM received return call from pt Called her back She reports continued elevation in cbg Purchased Relion from [employer] This am cbg 351 after eating oatmeal with fruit and water.  CM reviewed taking cbg prior to meals and 2 hr after meals Pt states taking lantus and metformin (first metformin this am) Reports nausea. Noted Dr Gwenlyn Perking not available.  Cm left voice message at [facility] for triage RN/NP Orvan Falconer) to request possible call for assistance Pending return call

## 2011-09-26 NOTE — Telephone Encounter (Signed)
Called and scheduled pt for today with Padonda.

## 2011-09-26 NOTE — Telephone Encounter (Signed)
We have not seen pt for DM yet. She is not scheduled to come in until 10/02/11. Please call to schedule sooner appointment

## 2011-09-26 NOTE — Telephone Encounter (Signed)
Case manager phone number on incoming call information, and her name is Selena Batten.

## 2011-09-26 NOTE — Telephone Encounter (Signed)
Contact information for the Case Manager??

## 2011-09-26 NOTE — Telephone Encounter (Addendum)
Pt. Is calling to speak to Padonda re: her BS that has risen to 361.  Please call pt with recommendations.

## 2011-09-26 NOTE — Progress Notes (Signed)
Follow up call to pt after noting notes from [place] office. Pt was seen today with re adjustment of medication dose Pt continues with nausea but vision improving. Discussed side effects of metformin.

## 2011-09-27 ENCOUNTER — Encounter (INDEPENDENT_AMBULATORY_CARE_PROVIDER_SITE_OTHER): Payer: PRIVATE HEALTH INSURANCE | Admitting: Ophthalmology

## 2011-10-02 ENCOUNTER — Ambulatory Visit: Payer: PRIVATE HEALTH INSURANCE | Admitting: Family

## 2011-10-04 ENCOUNTER — Encounter: Payer: Self-pay | Admitting: Family

## 2011-10-04 ENCOUNTER — Ambulatory Visit: Payer: PRIVATE HEALTH INSURANCE | Admitting: Family

## 2011-10-04 ENCOUNTER — Ambulatory Visit (INDEPENDENT_AMBULATORY_CARE_PROVIDER_SITE_OTHER): Payer: PRIVATE HEALTH INSURANCE | Admitting: Family

## 2011-10-04 VITALS — BP 120/74 | Temp 98.6°F | Wt 281.0 lb

## 2011-10-04 DIAGNOSIS — Z789 Other specified health status: Secondary | ICD-10-CM

## 2011-10-04 DIAGNOSIS — IMO0002 Reserved for concepts with insufficient information to code with codable children: Secondary | ICD-10-CM

## 2011-10-04 DIAGNOSIS — E1165 Type 2 diabetes mellitus with hyperglycemia: Secondary | ICD-10-CM

## 2011-10-04 DIAGNOSIS — IMO0001 Reserved for inherently not codable concepts without codable children: Secondary | ICD-10-CM

## 2011-10-04 DIAGNOSIS — E669 Obesity, unspecified: Secondary | ICD-10-CM

## 2011-10-04 DIAGNOSIS — Z888 Allergy status to other drugs, medicaments and biological substances status: Secondary | ICD-10-CM

## 2011-10-04 MED ORDER — GLIPIZIDE 10 MG PO TABS: 10.0000 mg | ORAL_TABLET | Freq: Two times a day (BID) | ORAL | Status: DC

## 2011-10-04 MED ORDER — INSULIN GLARGINE 100 UNIT/ML ~~LOC~~ SOLN: 20.0000 [IU] | Freq: Every day | SUBCUTANEOUS | Status: DC

## 2011-10-04 NOTE — Patient Instructions (Signed)
1800 Calorie Diabetic Diet  The 1800 calorie diabetic diet is designed for eating up to 1800 calories each day. Following this diet and making healthy meal choices can help improve overall health. It controls blood glucose (sugar) levels, and it can also help lower blood pressure and cholesterol.  SERVING SIZES  Measuring foods and serving sizes helps to make sure you are getting the right amount of food. The list below tells how big or small some common serving sizes are:   1 oz.........4 stacked dice.    3 oz.........Deck of cards.    1 tsp........Tip of little finger.    1 tbs........Thumb.    2 tbs........Golf ball.     cup.......Half of a fist.    1 cup........A fist.   GUIDELINES FOR CHOOSING FOODS  The goal of this diet is to eat a variety of foods and limit calories to 1800 each day. This can be done by choosing foods that are low in calories and fat. The diet also suggests eating small amounts of food frequently. Doing this helps control your blood glucose levels so they do not get too high or too low. Each meal or snack may include a protein food source to help you feel more satisfied. Try to eat about the same amount of food around the same time each day. This includes weekend days, travel days, and days off work. Space your meals about 4 to 5 hours apart, and add a snack between them, if you wish.    For example, a daily food plan could include breakfast, a morning snack, lunch, dinner, and an evening snack. Healthy meals and snacks have different types of foods, including whole grains, vegetables, fruits, lean meats, poultry, fish, and dairy products. As you plan your meals, select a variety of foods. Choose from the bread and starch, vegetable, fruit, dairy, and meat/protein groups. Examples of foods from each group are listed below with their suggested serving sizes. Use measuring cups and spoons to become familiar with what a healthy portion looks like.  Bread and Starch   Each serving equals 15 grams of carbohydrates.   1 slice bread.     bagel.     cup cold cereal (unsweetened).     cup hot cereal or mashed potatoes.    1 small potato (size of a computer mouse).    ? cup cooked pasta or rice.     English muffin.    1 cup broth-based soup.    3 cups of popcorn.    4 to 6 whole-wheat crackers.     cup cooked beans, peas, or corn.   Vegetables  Each serving equals 5 grams of carbohydrates.    cup cooked vegetables.    1 cup raw vegetables.     cup tomato or vegetable juice.   Fruit  Each serving equals 15 grams of carbohydrates.   1 small apple or orange.    1  cup watermelon or strawberries.     cup applesauce (no sugar added).    2 tbs raisins.     banana.     cup canned fruit, packed in water or in its own juice.     cup unsweetened fruit juice.   Dairy  Each serving equals 12 to 15 grams of carbohydrates.   1 cup fat-free milk.    6 oz artificially sweetened yogurt or plain yogurt.    1 cup low-fat buttermilk.    1 cup soy milk.    1 cup   almond milk.   Meat/Protein   1 large egg.    2 to 3 oz meat, poultry, or fish.     cup low-fat cottage cheese.    1 tbs peanut butter.    1 oz low-fat cheese.     cup tuna, packed in water.     cup tofu.   Fat   1 tsp oil.    1 tsp trans-fat-free margarine.    1 tsp butter.    1 tsp mayonnaise.    2 tbs avocado.    1 tbs salad dressing.    1 tbs cream cheese.    2 tbs sour cream.   SAMPLE 1800 CALORIE DIET PLAN  Breakfast    cup unsweetened cereal (1 carb serving).    1 cup fat-free milk (1 carb serving).    1 slice whole-wheat toast (1 carb serving).     small banana (1 carb serving).    1 scrambled egg.    1 tsp trans-fat-free margarine.   Lunch   Tuna sandwich.    2 slices whole-wheat bread (2 carb servings).     cup canned tuna in water, drained.    1 tbs reduced fat mayonnaise.    1 stalk celery, chopped.    2 slices tomato.    1 lettuce leaf.    1 cup carrot sticks.     24 to 30 seedless grapes (2 carb servings).    6 oz light yogurt (1 carb serving).   Afternoon Snack   3 graham cracker squares (1 carb serving).    1 cup fat-free milk (1 carb serving).    1 tbs peanut butter.   Dinner   3 oz salmon, broiled with 1 tsp oil.    1 cup mashed potatoes (2 carb servings) with 1 tsp trans-fat-free margarine.    1 cup fresh or frozen green beans.    1 cup steamed asparagus.    1 cup fat-free milk (1 carb serving).   Evening Snack   3 cups of air-popped popcorn (1 carb serving).    2 tbs Parmesan cheese.   Meal Plan  You can use this worksheet to help you make a daily meal plan based on the 1800 calorie diabetic diet suggestions. If you are using this plan to help you control your blood glucose, you may interchange carbohydrate-containing foods (dairy, starches, and fruits). Select a variety of fresh foods of varying colors and flavors. The total amount of carbohydrate in your meals or snacks is more important than making sure you include all of the food groups every time you eat. Choose from the approximate amount of the following foods to build your day's meals:   8 Starches.    4 Vegetables.    3 Fruits.    2 Dairy.    6 to 7 oz Meat/Protein.    Up to 4 Fats.   Your dietician can use this worksheet to help you decide how many servings and which types of foods are right for you.  BREAKFAST  Food Group and Servings / Food Choice  Starches _______________________________________________________  Dairy __________________________________________________________  Fruit ___________________________________________________________  Meat/Protein ____________________________________________________  Fat ____________________________________________________________  LUNCH  Food Group and Servings / Food Choice  Starch _________________________________________________________  Meat/Protein ___________________________________________________   Vegetables _____________________________________________________  Fruit __________________________________________________________  Dairy __________________________________________________________  Fat ____________________________________________________________  AFTERNOON SNACK  Food Group and Servings / Food Choice  Starch ________________________________________________________  Meat/Protein ___________________________________________________  Fruit __________________________________________________________  Dairy __________________________________________________________  DINNER  Food

## 2011-10-04 NOTE — Progress Notes (Signed)
Subjective:    Patient ID: Patricia Bell, female    DOB: 1981-01-18, 31 y.o.   MRN: [id number]  HPI 31 year old F. in [place] female, recently diagnosed type II diabetic he is in today for recheck. She was here one week ago and her metformin was increased to 1000 mg twice a day. Lantus 20 units at bedtime. She has not been able to tolerate the medication well. She has complaints of nausea, vomiting, diarrhea and wishes to switch medicines. Her current blood sugars are running between 200-270.    Review of Systems  Constitutional: Negative.   Respiratory: Negative.   Cardiovascular: Negative.   Gastrointestinal: Negative.   Genitourinary: Negative.   Skin: Negative.   Neurological: Negative.   Hematological: Negative.   Psychiatric/Behavioral: Negative.    Past Medical History  Diagnosis Date  . Depression     History   Social History  . Marital Status: Single    Spouse Name: N/A    Number of Children: N/A  . Years of Education: N/A   Occupational History  . Not on file.   Social History Main Topics  . Smoking status: Never Smoker   . Smokeless tobacco: Never Used  . Alcohol Use: No  . Drug Use: No  . Sexually Active: No   Other Topics Concern  . Not on file   Social History Narrative  . No narrative on file    No past surgical history on file.  No family history on file.  Allergies  Allergen Reactions  . Shellfish Allergy Itching, Nausea And Vomiting and Swelling    Current Outpatient Prescriptions on File Prior to Visit  Medication Sig Dispense Refill  . doxycycline (VIBRA-TABS) 100 MG tablet Take 1 tablet (100 mg total) by mouth 2 (two) times daily.  14 tablet  0  . Insulin Syringes, Disposable, U-100 0.3 ML MISC -use to inject insulin at night as instructed everyday  100 each  1  . loratadine (CLARITIN) 10 MG tablet Take 1 tablet (10 mg total) by mouth daily.  30 tablet  1  . naproxen sodium (ANAPROX) 220 MG tablet Take 220 mg by mouth daily as  needed. For pain       . DISCONTD: insulin glargine (LANTUS) 100 UNIT/ML injection Inject 15 Units into the skin once.  10 mL  1  . glipiZIDE (GLUCOTROL) 10 MG tablet Take 1 tablet (10 mg total) by mouth 2 (two) times daily before a meal.  60 tablet  3    BP 120/74  Temp(Src) 98.6 F (37 C) (Oral)  Wt 281 lb (127.461 kg)  LMP 04/01/2013chart    Objective:   Physical Exam  Constitutional: She is oriented to person, place, and time. She appears well-developed and well-nourished.  HENT:  Right Ear: External ear normal.  Left Ear: External ear normal.  Nose: Nose normal.  Mouth/Throat: Oropharynx is clear and moist.  Neck: Normal range of motion. Neck supple.  Cardiovascular: Normal rate, regular rhythm and normal heart sounds.   Pulmonary/Chest: Effort normal and breath sounds normal.  Abdominal: Soft. Bowel sounds are normal.  Neurological: She is alert and oriented to person, place, and time.  Skin: Skin is warm and dry.  Psychiatric: She has a normal mood and affect.          Assessment & Plan:  Assessment: Type 2 DM-Uncontrolled, Intolerance to Medication, Obesity  Plan: DC metformin. Start glipizide 10 mg twice a day. Continue Lantus 20 units at bedtime. 1800-2000-calorie diet. Call the office  with any questions or concerns. Recheck in one month for complete physical exam or sooner when necessary.

## 2011-10-29 ENCOUNTER — Encounter: Payer: Self-pay | Admitting: Family

## 2011-10-29 ENCOUNTER — Ambulatory Visit (INDEPENDENT_AMBULATORY_CARE_PROVIDER_SITE_OTHER): Payer: PRIVATE HEALTH INSURANCE | Admitting: Family

## 2011-10-29 DIAGNOSIS — E119 Type 2 diabetes mellitus without complications: Secondary | ICD-10-CM

## 2011-10-29 DIAGNOSIS — E669 Obesity, unspecified: Secondary | ICD-10-CM

## 2011-10-29 NOTE — Patient Instructions (Signed)

## 2011-10-29 NOTE — Progress Notes (Signed)
Subjective:    Patient ID: Patricia Bell, female    DOB: Sep 10, 1980, 31 y.o.   MRN: [id number]  HPI 31 year old [place] American female is in for recheck of type 2 diabetes. She does recently diagnosed about 6 weeks ago. She's currently taken Lantus 20 mg a day and glipizide 10 mg twice a day. She's tolerating her medications well. The goal for her husband to decrease her Lantus and to put her oral medication. She's been on metformin in the past 7000 mg twice a day. She did not tolerate the thousand milligram dosage. At some point, would like to try the 500 mg dosage twice a day. Her blood sugars at this point are running 61-135. She has changed her diet and is exercising. She denies any lightheadedness, dizziness, chest pain, palpitations, shortness of breath or edema.   Review of Systems  Constitutional: Negative.   HENT: Negative.   Eyes: Negative.   Respiratory: Negative.   Cardiovascular: Negative.   Gastrointestinal: Negative.   Genitourinary: Negative.  Negative for urgency and frequency.  Musculoskeletal: Negative.   Neurological: Negative.   Hematological: Negative.   Psychiatric/Behavioral: Negative.    Past Medical History  Diagnosis Date  . Depression     History   Social History  . Marital Status: Single    Spouse Name: N/A    Number of Children: N/A  . Years of Education: N/A   Occupational History  . Not on file.   Social History Main Topics  . Smoking status: Never Smoker   . Smokeless tobacco: Never Used  . Alcohol Use: No  . Drug Use: No  . Sexually Active: No   Other Topics Concern  . Not on file   Social History Narrative  . No narrative on file    No past surgical history on file.  No family history on file.  Allergies  Allergen Reactions  . Shellfish Allergy Itching, Nausea And Vomiting and Swelling    Current Outpatient Prescriptions on File Prior to Visit  Medication Sig Dispense Refill  . glipiZIDE (GLUCOTROL) 10 MG tablet Take 1  tablet (10 mg total) by mouth 2 (two) times daily before a meal.  60 tablet  3  . insulin glargine (LANTUS) 100 UNIT/ML injection Inject 20 Units into the skin at bedtime.  10 mL  1  . Insulin Syringes, Disposable, U-100 0.3 ML MISC -use to inject insulin at night as instructed everyday  100 each  1  . loratadine (CLARITIN) 10 MG tablet Take 1 tablet (10 mg total) by mouth daily.  30 tablet  1  . naproxen sodium (ANAPROX) 220 MG tablet Take 220 mg by mouth daily as needed. For pain         BP 124/88  Temp(Src) 98.8 F (37.1 C) (Oral)  Wt 278 lb (126.1 kg)chart    Objective:   Physical Exam  Constitutional: She is oriented to person, place, and time. She appears well-developed and well-nourished.  HENT:  Right Ear: External ear normal.  Left Ear: External ear normal.  Nose: Nose normal.  Mouth/Throat: Oropharynx is clear and moist.  Eyes: Pupils are equal, round, and reactive to light.  Neck: Normal range of motion. Neck supple.  Cardiovascular: Normal rate, regular rhythm and normal heart sounds.   Pulmonary/Chest: Effort normal and breath sounds normal.  Abdominal: Soft. Bowel sounds are normal.  Neurological: She is alert and oriented to person, place, and time. She has normal reflexes.  Skin: Skin is warm and dry.  Psychiatric:  She has a normal mood and affect.          Assessment & Plan:  Assessment: Type 2 diabetes, obesity  Plan: Decrease her Lantus to 15 units daily. Continue glipizide 10 mg twice a day. Continue healthy diet and exercise. Bring patient back for complete physical exam with Pap and pelvic. Consider decreasing her Lantus more at that visit and maybe increase in her glipizide or adding metformin back to her medication regimen. Patient to call the office with any questions or concerns. Recheck as discussed and sooner when necessary.

## 2011-11-07 ENCOUNTER — Other Ambulatory Visit: Payer: PRIVATE HEALTH INSURANCE

## 2011-11-19 ENCOUNTER — Ambulatory Visit (INDEPENDENT_AMBULATORY_CARE_PROVIDER_SITE_OTHER): Payer: PRIVATE HEALTH INSURANCE | Admitting: Family

## 2011-11-19 ENCOUNTER — Other Ambulatory Visit (HOSPITAL_COMMUNITY)
Admission: RE | Admit: 2011-11-19 | Discharge: 2011-11-19 | Disposition: A | Discharge status: Home / Self Care | Payer: PRIVATE HEALTH INSURANCE | Source: Ambulatory Visit | Attending: Family | Admitting: Family

## 2011-11-19 ENCOUNTER — Encounter: Payer: Self-pay | Admitting: Family

## 2011-11-19 VITALS — BP 118/72 | Ht 64.0 in | Wt 274.0 lb

## 2011-11-19 DIAGNOSIS — E1165 Type 2 diabetes mellitus with hyperglycemia: Secondary | ICD-10-CM

## 2011-11-19 DIAGNOSIS — IMO0001 Reserved for inherently not codable concepts without codable children: Secondary | ICD-10-CM

## 2011-11-19 DIAGNOSIS — Z0000 Encounter for general adult medical examination without abnormal findings: Secondary | ICD-10-CM

## 2011-11-19 DIAGNOSIS — Z23 Encounter for immunization: Secondary | ICD-10-CM

## 2011-11-19 DIAGNOSIS — Z124 Encounter for screening for malignant neoplasm of cervix: Secondary | ICD-10-CM

## 2011-11-19 DIAGNOSIS — Z01419 Encounter for gynecological examination (general) (routine) without abnormal findings: Secondary | ICD-10-CM | POA: Insufficient documentation

## 2011-11-19 DIAGNOSIS — IMO0002 Reserved for concepts with insufficient information to code with codable children: Secondary | ICD-10-CM

## 2011-11-19 LAB — COMPREHENSIVE METABOLIC PANEL
Albumin: 3.6 g/dL (ref 3.5–5.2)
BUN: 18 mg/dL (ref 6–23)
CO2: 25 mEq/L (ref 19–32)
GFR: 76.05 mL/min (ref >60.00)
Glucose, Bld: 95 mg/dL (ref 70–99)
Sodium: 139 mEq/L (ref 135–145)
Total Bilirubin: 0.3 mg/dL (ref 0.3–1.2)
Total Protein: 6.7 g/dL (ref 6.0–8.3)

## 2011-11-19 LAB — CBC WITH DIFFERENTIAL/PLATELET
Basophils Relative: 0.3 % (ref 0.0–3.0)
Eosinophils Absolute: 0.5 10*3/uL (ref 0.0–0.7)
Hemoglobin: 12.5 g/dL (ref 12.0–15.0)
Lymphs Abs: 3.5 10*3/uL (ref 0.7–4.0)
MCHC: 32.6 g/dL (ref 30.0–36.0)
MCV: 73.1 fl — ABNORMAL LOW (ref 78.0–100.0)
Monocytes Absolute: 0.7 10*3/uL (ref 0.1–1.0)
Neutro Abs: 6.3 10*3/uL (ref 1.4–7.7)
RBC: 5.24 Mil/uL — ABNORMAL HIGH (ref 3.87–5.11)

## 2011-11-19 LAB — LIPID PANEL
Cholesterol: 151 mg/dL (ref 0–200)
Triglycerides: 96 mg/dL (ref 0.0–149.0)
VLDL: 19.2 mg/dL (ref 0.0–40.0)

## 2011-11-19 MED ORDER — LINAGLIPTIN 5 MG PO TABS: 5.0000 mg | ORAL_TABLET | Freq: Every day | ORAL | Status: DC

## 2011-11-19 NOTE — Progress Notes (Signed)
Subjective:    Patient ID: Patricia Bell, female    DOB: July 15, 1980, 31 y.o.   MRN: [id number]  HPI 31 year old [place] American female, nonsmoker, is in for complete physical exam. She has a history of type 2 diabetes , recently diagnosed. She's currently taken Lantus 15 units and glipizide 10 mg a day. She was intolerant to metformin. She's requesting a cough of insulin today. Her blood sugars have been between 101-114 fasting. This is a routine physical examination for this healthy  Female. Reviewed all health maintenance protocols including appropriate screening labs. Her immunization history was reviewed as well as her current medications and allergies refills of her chronic medications were given and the plan for yearly health maintenance was discussed all orders and referrals were made as appropriate.  Review of Systems  Constitutional: Negative.   HENT: Negative.   Eyes: Negative.   Respiratory: Negative.   Cardiovascular: Negative.  Negative for chest pain and leg swelling.  Gastrointestinal: Negative.   Genitourinary: Negative.  Negative for urgency, vaginal bleeding and vaginal discharge.  Musculoskeletal: Negative.   Skin: Negative.   Neurological: Negative.   Hematological: Negative.   Psychiatric/Behavioral: Negative.    Past Medical History  Diagnosis Date  . Depression     History   Social History  . Marital Status: Single    Spouse Name: N/A    Number of Children: N/A  . Years of Education: N/A   Occupational History  . Not on file.   Social History Main Topics  . Smoking status: Never Smoker   . Smokeless tobacco: Never Used  . Alcohol Use: No  . Drug Use: No  . Sexually Active: No   Other Topics Concern  . Not on file   Social History Narrative  . No narrative on file    No past surgical history on file.  No family history on file.  Allergies  Allergen Reactions  . Shellfish Allergy Itching, Nausea And Vomiting and Swelling    Current  Outpatient Prescriptions on File Prior to Visit  Medication Sig Dispense Refill  . glipiZIDE (GLUCOTROL) 10 MG tablet Take 1 tablet (10 mg total) by mouth 2 (two) times daily before a meal.  60 tablet  3  . insulin glargine (LANTUS) 100 UNIT/ML injection Inject 20 Units into the skin at bedtime.  10 mL  1  . Insulin Syringes, Disposable, U-100 0.3 ML MISC -use to inject insulin at night as instructed everyday  100 each  1  . loratadine (CLARITIN) 10 MG tablet Take 1 tablet (10 mg total) by mouth daily.  30 tablet  1  . naproxen sodium (ANAPROX) 220 MG tablet Take 220 mg by mouth daily as needed. For pain       . linagliptin (TRADJENTA) 5 MG TABS tablet Take 1 tablet (5 mg total) by mouth daily.  28 tablet  0    BP 118/72  Ht 5\' 4"  (1.626 m)  Wt 274 lb (124.286 kg)  BMI 47.03 kg/m2  LMP 06/03/2013chart    Objective:   Physical Exam  Constitutional: She is oriented to person, place, and time. She appears well-developed and well-nourished.  HENT:  Head: Normocephalic and atraumatic.  Right Ear: External ear normal.  Left Ear: External ear normal.  Nose: Nose normal.  Mouth/Throat: Oropharynx is clear and moist.  Eyes: Conjunctivae and EOM are normal. Pupils are equal, round, and reactive to light.  Neck: Normal range of motion. Neck supple. No thyromegaly present.  Cardiovascular: Normal rate, normal  heart sounds and intact distal pulses.   Pulmonary/Chest: Effort normal and breath sounds normal. She has no wheezes.  Abdominal: Soft. Bowel sounds are normal. There is no tenderness. There is no rebound and no guarding.  Genitourinary: Vagina normal and uterus normal. No vaginal discharge found.  Musculoskeletal: Normal range of motion.  Neurological: She is alert and oriented to person, place, and time. She has normal reflexes. She displays normal reflexes. No cranial nerve deficit. Coordination normal.       Monofilament intact. Feet skin intact.  Skin: Skin is warm and dry.    Psychiatric: She has a normal mood and affect.          Assessment & Plan:  Assessment: Complete physical exam with Pap and pelvic, type 2 diabetes  Plan: Lab sent to include BMP, CBC, urine microalbumin will notify patient pending results. Will switch to Tredjenta to 5 mg once daily and continue glipizide 10 mg twice daily. DC insulin. Patient will closely monitor her blood sugars over the next several weeks. Bring her back for recheck in 3-4 weeks. I advised her blood sugars may be labile because we are changing her medication. Continue healthy diet and exercise, self breast exams. Follow with patient when the results of her labs, as scheduled, and when necessary.

## 2011-11-19 NOTE — Patient Instructions (Signed)

## 2012-02-06 ENCOUNTER — Other Ambulatory Visit: Payer: Self-pay | Admitting: Family

## 2012-03-14 ENCOUNTER — Ambulatory Visit: Payer: PRIVATE HEALTH INSURANCE | Admitting: Family

## 2012-03-21 ENCOUNTER — Ambulatory Visit: Payer: PRIVATE HEALTH INSURANCE | Admitting: Family

## 2012-03-24 ENCOUNTER — Ambulatory Visit (INDEPENDENT_AMBULATORY_CARE_PROVIDER_SITE_OTHER): Payer: PRIVATE HEALTH INSURANCE | Admitting: Family

## 2012-03-24 ENCOUNTER — Encounter: Payer: Self-pay | Admitting: Family

## 2012-03-24 VITALS — BP 114/80 | HR 89 | Temp 99.0°F | Wt 274.0 lb

## 2012-03-24 DIAGNOSIS — Z23 Encounter for immunization: Secondary | ICD-10-CM

## 2012-03-24 DIAGNOSIS — E119 Type 2 diabetes mellitus without complications: Secondary | ICD-10-CM

## 2012-03-24 DIAGNOSIS — E669 Obesity, unspecified: Secondary | ICD-10-CM

## 2012-03-24 LAB — LIPID PANEL
Cholesterol: 173 mg/dL (ref 0–200)
LDL Cholesterol: 115 mg/dL — ABNORMAL HIGH (ref 0–99)
VLDL: 22.4 mg/dL (ref 0.0–40.0)

## 2012-03-24 LAB — BASIC METABOLIC PANEL
BUN: 15 mg/dL (ref 6–23)
CO2: 26 mEq/L (ref 19–32)
Chloride: 107 mEq/L (ref 96–112)
Creatinine, Ser: 1 mg/dL (ref 0.4–1.2)
Glucose, Bld: 90 mg/dL (ref 70–99)

## 2012-03-24 MED ORDER — METFORMIN HCL 500 MG PO TABS: 500.0000 mg | ORAL_TABLET | Freq: Two times a day (BID) | ORAL | Status: DC

## 2012-03-24 MED ORDER — PHENTERMINE HCL 37.5 MG PO TABS: 37.5000 mg | ORAL_TABLET | Freq: Every day | ORAL | Status: DC

## 2012-03-24 NOTE — Patient Instructions (Signed)
Diabetes and Exercise  Regular exercise is important and can help:   · Control blood glucose (sugar).  · Decrease blood pressure.  ·   · Control blood lipids (cholesterol, triglycerides).  · Improve overall health.  BENEFITS FROM EXERCISE  · Improved fitness.  · Improved flexibility.  · Improved endurance.  · Increased bone density.  · Weight control.  · Increased muscle strength.  · Decreased body fat.  · Improvement of the body's use of insulin, a hormone.  · Increased insulin sensitivity.  · Reduction of insulin needs.  · Reduced stress and tension.  · Helps you feel better.  People with diabetes who add exercise to their lifestyle gain additional benefits, including:  · Weight loss.  · Reduced appetite.  · Improvement of the body's use of blood glucose.  · Decreased risk factors for heart disease:  · Lowering of cholesterol and triglycerides.  · Raising the level of good cholesterol (high-density lipoproteins, HDL).  · Lowering blood sugar.  · Decreased blood pressure.  TYPE 1 DIABETES AND EXERCISE  · Exercise will usually lower your blood glucose.  · If blood glucose is greater than 240 mg/dl, check urine ketones. If ketones are present, do not exercise.  · Location of the insulin injection sites may need to be adjusted with exercise. Avoid injecting insulin into areas of the body that will be exercised. For example, avoid injecting insulin into:  · The arms when playing tennis.  · The legs when jogging. For more information, discuss this with your caregiver.  · Keep a record of:  · Food intake.  · Type and amount of exercise.  · Expected peak times of insulin action.  · Blood glucose levels.  Do this before, during, and after exercise. Review your records with your caregiver. This will help you to develop guidelines for adjusting food intake and insulin amounts.   TYPE 2 DIABETES AND EXERCISE  · Regular physical activity can help control blood glucose.  · Exercise is important because it may:  · Increase the  body's sensitivity to insulin.  · Improve blood glucose control.  · Exercise reduces the risk of heart disease. It decreases serum cholesterol and triglycerides. It also lowers blood pressure.  · Those who take insulin or oral hypoglycemic agents should watch for signs of hypoglycemia. These signs include dizziness, shaking, sweating, chills, and confusion.  · Body water is lost during exercise. It must be replaced. This will help to avoid loss of body fluids (dehydration) or heat stroke.  Be sure to talk to your caregiver before starting an exercise program to make sure it is safe for you. Remember, any activity is better than none.   Document Released: 08/18/2003 Document Revised: 08/20/2011 Document Reviewed: 12/02/2008  [employer]® Patient Information ©2013 [employer].

## 2012-03-24 NOTE — Progress Notes (Signed)
Subjective:    Patient ID: Patricia Bell, female    DOB: 06-May-1981, 31 y.o.   MRN: [id number]  HPI 31 year old [place] American female, nonsmoker, obese is in for recheck of type 2 diabetes. She's currently taking metformin once a day and glipizide. She was on Cogentin but was unable to tolerate the medication and discontinued it. Reports blood sugars being between 140-160 both fasting and postprandial. Patient reports walking a mile twice a week.  Patient reports pain in her feet x1 month. Reports is worse when she's walking. Is able to massage her feet and the pain goes away. He rates the pain a 6/10. Describes it as a dull ache. Denies any numbness or tingling. The right foot is worse than the left. No history of neuropathy.   Review of Systems  Constitutional: Negative.   HENT: Negative.   Respiratory: Negative.   Cardiovascular: Negative.   Gastrointestinal: Negative.   Genitourinary: Negative.   Musculoskeletal: Negative.  Myalgias: .  Skin: Negative.   Neurological: Negative.   Hematological: Negative.   Psychiatric/Behavioral: Negative.    Past Medical History  Diagnosis Date  . Depression     History   Social History  . Marital Status: Single    Spouse Name: N/A    Number of Children: N/A  . Years of Education: N/A   Occupational History  . Not on file.   Social History Main Topics  . Smoking status: Never Smoker   . Smokeless tobacco: Never Used  . Alcohol Use: No  . Drug Use: No  . Sexually Active: No   Other Topics Concern  . Not on file   Social History Narrative  . No narrative on file    No past surgical history on file.  No family history on file.  Allergies  Allergen Reactions  . Shellfish Allergy Itching, Nausea And Vomiting and Swelling    Current Outpatient Prescriptions on File Prior to Visit  Medication Sig Dispense Refill  . glipiZIDE (GLUCOTROL) 10 MG tablet TAKE 1 TABLET TWICE A DAY BEFORE A MEAL  60 tablet  3  . linagliptin  (TRADJENTA) 5 MG TABS tablet Take 1 tablet (5 mg total) by mouth daily.  28 tablet  0  . loratadine (CLARITIN) 10 MG tablet Take 1 tablet (10 mg total) by mouth daily.  30 tablet  1  . naproxen sodium (ANAPROX) 220 MG tablet Take 220 mg by mouth daily as needed. For pain       . phentermine (ADIPEX-P) 37.5 MG tablet Take 1 tablet (37.5 mg total) by mouth daily before breakfast.  30 tablet  0    BP 114/80  Pulse 89  Temp 99 F (37.2 C) (Oral)  Wt 274 lb (124.286 kg)  SpO2 97%chart    Objective:   Physical Exam  Constitutional: She is oriented to person, place, and time. She appears well-developed and well-nourished.  HENT:  Right Ear: External ear normal.  Left Ear: External ear normal.  Nose: Nose normal.  Mouth/Throat: Oropharynx is clear and moist.  Eyes: Conjunctivae normal are normal. Pupils are equal, round, and reactive to light.  Neck: Normal range of motion. Neck supple. No thyromegaly present.  Cardiovascular: Normal rate, regular rhythm and normal heart sounds.   Pulmonary/Chest: Effort normal and breath sounds normal.  Abdominal: Soft. Bowel sounds are normal.  Musculoskeletal: Normal range of motion.  Neurological: She is alert and oriented to person, place, and time.  Skin: Skin is warm.  Psychiatric: She has a normal  mood and affect.          Assessment & Plan:  Assessment: Type 2 diabetes, obesity-uncontrolled, bilateral feet pain  Plan: Patient may have an underlying spur to the heels. However, we can consider an x-ray. I believe she reduce her weight her diabetes will be better controlled and her feet will have less pain. Start phentermine 37.5 mg to aid in weight loss. Discussed at length signs and symptoms, benefits versus risk. Patient verbalized understanding. For bring patient back for recheck in 4 weeks and sooner when necessary.

## 2012-05-02 ENCOUNTER — Ambulatory Visit (INDEPENDENT_AMBULATORY_CARE_PROVIDER_SITE_OTHER): Payer: PRIVATE HEALTH INSURANCE | Admitting: Family

## 2012-05-02 ENCOUNTER — Encounter: Payer: Self-pay | Admitting: Family

## 2012-05-02 VITALS — BP 120/82 | HR 86 | Wt 264.0 lb

## 2012-05-02 DIAGNOSIS — E119 Type 2 diabetes mellitus without complications: Secondary | ICD-10-CM | POA: Insufficient documentation

## 2012-05-02 DIAGNOSIS — E669 Obesity, unspecified: Secondary | ICD-10-CM

## 2012-05-02 DIAGNOSIS — J309 Allergic rhinitis, unspecified: Secondary | ICD-10-CM | POA: Insufficient documentation

## 2012-05-02 DIAGNOSIS — Z6841 Body Mass Index (BMI) 40.0 and over, adult: Secondary | ICD-10-CM | POA: Insufficient documentation

## 2012-05-02 MED ORDER — PHENTERMINE HCL 37.5 MG PO TABS: 37.5000 mg | ORAL_TABLET | Freq: Every day | ORAL | Status: DC

## 2012-05-02 NOTE — Progress Notes (Signed)
Subjective:    Patient ID: Patricia Bell, female    DOB: 1980/11/15, 31 y.o.   MRN: [id number]  HPI 31 year old African American female, nonsmoker, obese patient is in for a recheck of obesity. She's currently on phentermine 37.5 mg one tablet daily. She's tolerating the medication well. However, she reports having difficulty sleeping at night. She's exercising 2-3 times per week for an hour at a time. Patient is also reduced her metformin to 500 mg at bedtime only because her blood sugars have been low. She's ranging in the 110s fasting. Last A1C was 5.9. She is down 10 pounds in the last 1 month and 42lbs in the last 1 year.    Review of Systems  Constitutional: Positive for appetite change. Negative for fever and fatigue.  HENT: Negative.   Respiratory: Negative.   Cardiovascular: Negative.   Gastrointestinal: Negative.   Genitourinary: Negative.   Musculoskeletal: Negative.   Skin: Negative.   Neurological: Negative.   Hematological: Negative.   Psychiatric/Behavioral: Positive for sleep disturbance. The patient is not nervous/anxious.    Past Medical History  Diagnosis Date  . Depression     History   Social History  . Marital Status: Single    Spouse Name: N/A    Number of Children: N/A  . Years of Education: N/A   Occupational History  . Not on file.   Social History Main Topics  . Smoking status: Never Smoker   . Smokeless tobacco: Never Used  . Alcohol Use: No  . Drug Use: No  . Sexually Active: No   Other Topics Concern  . Not on file   Social History Narrative  . No narrative on file    No past surgical history on file.  No family history on file.  Allergies  Allergen Reactions  . Shellfish Allergy Itching, Nausea And Vomiting and Swelling    Current Outpatient Prescriptions on File Prior to Visit  Medication Sig Dispense Refill  . glipiZIDE (GLUCOTROL) 10 MG tablet TAKE 1 TABLET TWICE A DAY BEFORE A MEAL  60 tablet  3  . linagliptin  (TRADJENTA) 5 MG TABS tablet Take 1 tablet (5 mg total) by mouth daily.  28 tablet  0  . loratadine (CLARITIN) 10 MG tablet Take 1 tablet (10 mg total) by mouth daily.  30 tablet  1  . naproxen sodium (ANAPROX) 220 MG tablet Take 220 mg by mouth daily as needed. For pain       . [DISCONTINUED] phentermine (ADIPEX-P) 37.5 MG tablet Take 1 tablet (37.5 mg total) by mouth daily before breakfast.  30 tablet  0    BP 120/82  Pulse 86  Wt 264 lb (119.75 kg)  SpO2 99%chart    Objective:   Physical Exam  Constitutional: She is oriented to person, place, and time. She appears well-developed and well-nourished.  HENT:  Right Ear: External ear normal.  Left Ear: External ear normal.  Nose: Nose normal.  Mouth/Throat: Oropharynx is clear and moist.  Neck: Normal range of motion. Neck supple. No thyromegaly present.  Cardiovascular: Normal rate, regular rhythm and normal heart sounds.   Pulmonary/Chest: Effort normal and breath sounds normal.  Abdominal: Soft. Bowel sounds are normal.  Musculoskeletal: Normal range of motion.  Neurological: She is alert and oriented to person, place, and time.  Skin: Skin is warm and dry.  Psychiatric: She has a normal mood and affect.          Assessment & Plan:  Assessment: Obesity, Type 2  Diabetes,   Plan: D/C Metformin. Continue Glipizide. Continue Phentermine. Continue exercise at least 30 min a day. Recheck in 4 weeks and as needed sooner.

## 2012-05-02 NOTE — Patient Instructions (Addendum)

## 2012-05-30 ENCOUNTER — Encounter: Payer: Self-pay | Admitting: Family

## 2012-05-30 ENCOUNTER — Ambulatory Visit (INDEPENDENT_AMBULATORY_CARE_PROVIDER_SITE_OTHER): Payer: 59 | Admitting: Family

## 2012-05-30 VITALS — BP 122/78 | HR 95 | Temp 98.2°F | Wt 259.0 lb

## 2012-05-30 DIAGNOSIS — E669 Obesity, unspecified: Secondary | ICD-10-CM

## 2012-05-30 DIAGNOSIS — E119 Type 2 diabetes mellitus without complications: Secondary | ICD-10-CM

## 2012-05-30 DIAGNOSIS — M79671 Pain in right foot: Secondary | ICD-10-CM

## 2012-05-30 DIAGNOSIS — M79609 Pain in unspecified limb: Secondary | ICD-10-CM

## 2012-05-30 MED ORDER — GLIPIZIDE 10 MG PO TABS: 5.0000 mg | ORAL_TABLET | Freq: Two times a day (BID) | ORAL | Status: DC

## 2012-05-30 MED ORDER — PHENTERMINE HCL 37.5 MG PO TABS: 37.5000 mg | ORAL_TABLET | Freq: Every day | ORAL | Status: DC

## 2012-05-30 NOTE — Progress Notes (Signed)
Subjective:    Patient ID: Patricia Bell, female    DOB: 1981/04/01, 31 y.o.   MRN: [id number]  HPI 31 year old [place] American female, nonsmoker is in for recheck of obesity.[place] female, nonsmoker is in for recheck of obesity. She has a history of type 2 diabetes in his mental remarkably well since her diagnosis in April 2013. Her blood sugars have been between 80 and 90 taking glipizide 10 mg twice a day. She's currently taking phentermine 37.5 mg once daily and is currently down 5 pounds this month. She has lost approximately 40 pounds over the last several months.  Patient reports right heel pain x1 month. Over the last couple weeks, it's worse in the every day. Rates the pain as 7/10, describes as achy, worse with walking. She's been unable to exercise with the last 2 weeks related to the pain. Has not taken any medication for relief   Review of Systems  Constitutional: Negative.   HENT: Negative.   Respiratory: Negative.   Cardiovascular: Negative.   Gastrointestinal: Negative.   Genitourinary: Negative.   Musculoskeletal: Negative.        Right heel pain  Skin: Negative.   Neurological: Negative.   Hematological: Negative.   Psychiatric/Behavioral: Negative.    Past Medical History  Diagnosis Date  . Depression     History   Social History  . Marital Status: Single    Spouse Name: N/A    Number of Children: N/A  . Years of Education: N/A   Occupational History  . Not on file.   Social History Main Topics  . Smoking status: Never Smoker   . Smokeless tobacco: Never Used  . Alcohol Use: No  . Drug Use: No  . Sexually Active: No   Other Topics Concern  . Not on file   Social History Narrative  . No narrative on file    No past surgical history on file.  No family history on file.  Allergies  Allergen Reactions  . Shellfish Allergy Itching, Nausea And Vomiting and Swelling    Current Outpatient Prescriptions on File Prior to Visit  Medication Sig Dispense Refill  . glipiZIDE (GLUCOTROL) 10 MG  tablet TAKE 1 TABLET TWICE A DAY BEFORE A MEAL  60 tablet  3  . linagliptin (TRADJENTA) 5 MG TABS tablet Take 1 tablet (5 mg total) by mouth daily.  28 tablet  0  . loratadine (CLARITIN) 10 MG tablet Take 1 tablet (10 mg total) by mouth daily.  30 tablet  1  . naproxen sodium (ANAPROX) 220 MG tablet Take 220 mg by mouth daily as needed. For pain       . phentermine (ADIPEX-P) 37.5 MG tablet Take 1 tablet (37.5 mg total) by mouth daily before breakfast.  30 tablet  0    BP 122/78  Pulse 95  Temp 98.2 F (36.8 C) (Oral)  Wt 259 lb (117.482 kg)  SpO2 98%chart   Objective:   Physical Exam  Constitutional: She is oriented to person, place, and time. She appears well-developed and well-nourished.  HENT:  Right Ear: External ear normal.  Left Ear: External ear normal.  Nose: Nose normal.  Mouth/Throat: Oropharynx is clear and moist.  Neck: Normal range of motion. Neck supple.  Cardiovascular: Normal rate, regular rhythm and normal heart sounds.   Pulmonary/Chest: Effort normal and breath sounds normal.  Abdominal: Soft. Bowel sounds are normal.  Musculoskeletal: Normal range of motion. She exhibits no edema and no tenderness.       Tenderness to palpation of the right  heel. No swelling.   Neurological: She is alert and oriented to person, place, and time.  Skin: Skin is warm and dry.  Psychiatric: She has a normal mood and affect.          Assessment & Plan:  Assessment: Right heel pain, obesity, type 2 diabetes  Plan: Continue phentermine 37.5 mg once daily. X-ray of the right foot to rule out heel spur. We'll discuss further treatment plan at that time. Decrease glipizide to 5 mg twice a day. Patient call the office with any questions or concerns. Recheck in one month, sooner when necessary.

## 2012-05-30 NOTE — Patient Instructions (Addendum)

## 2012-06-05 ENCOUNTER — Ambulatory Visit (INDEPENDENT_AMBULATORY_CARE_PROVIDER_SITE_OTHER)
Admission: RE | Admit: 2012-06-05 | Discharge: 2012-06-05 | Disposition: A | Discharge status: Home / Self Care | Payer: 59 | Source: Ambulatory Visit | Attending: Family | Admitting: Family

## 2012-06-05 DIAGNOSIS — M79609 Pain in unspecified limb: Secondary | ICD-10-CM

## 2012-06-05 DIAGNOSIS — M79671 Pain in right foot: Secondary | ICD-10-CM

## 2012-06-06 ENCOUNTER — Telehealth: Payer: Self-pay | Admitting: Family

## 2012-06-06 NOTE — Telephone Encounter (Signed)
See result note.  

## 2012-06-06 NOTE — Telephone Encounter (Signed)
Patient is returning the nurse's call.

## 2012-06-18 ENCOUNTER — Other Ambulatory Visit: Payer: Self-pay | Admitting: Family

## 2012-06-30 ENCOUNTER — Ambulatory Visit: Payer: 59 | Admitting: Family

## 2012-07-07 ENCOUNTER — Ambulatory Visit: Payer: 59 | Admitting: Family

## 2012-07-09 ENCOUNTER — Ambulatory Visit: Payer: 59 | Admitting: Family

## 2012-07-11 ENCOUNTER — Ambulatory Visit (INDEPENDENT_AMBULATORY_CARE_PROVIDER_SITE_OTHER): Payer: 59 | Admitting: Family

## 2012-07-11 ENCOUNTER — Encounter: Payer: Self-pay | Admitting: Family

## 2012-07-11 VITALS — BP 120/84 | HR 112 | Wt 252.0 lb

## 2012-07-11 DIAGNOSIS — E7800 Pure hypercholesterolemia, unspecified: Secondary | ICD-10-CM

## 2012-07-11 DIAGNOSIS — E119 Type 2 diabetes mellitus without complications: Secondary | ICD-10-CM

## 2012-07-11 DIAGNOSIS — E669 Obesity, unspecified: Secondary | ICD-10-CM

## 2012-07-11 LAB — CBC WITH DIFFERENTIAL/PLATELET
Eosinophils Absolute: 0.3 10*3/uL (ref 0.0–0.7)
HCT: 37.1 % (ref 36.0–46.0)
Lymphs Abs: 3.1 10*3/uL (ref 0.7–4.0)
MCHC: 33.1 g/dL (ref 30.0–36.0)
MCV: 71.1 fl — ABNORMAL LOW (ref 78.0–100.0)
Monocytes Absolute: 0.5 10*3/uL (ref 0.1–1.0)
Neutrophils Relative %: 60.5 % (ref 43.0–77.0)
Platelets: 306 10*3/uL (ref 150.0–400.0)

## 2012-07-11 LAB — BASIC METABOLIC PANEL
CO2: 26 mEq/L (ref 19–32)
Chloride: 106 mEq/L (ref 96–112)
Potassium: 4.2 mEq/L (ref 3.5–5.1)
Sodium: 137 mEq/L (ref 135–145)

## 2012-07-11 LAB — HEPATIC FUNCTION PANEL
ALT: 18 U/L (ref 0–35)
AST: 15 U/L (ref 0–37)
Alkaline Phosphatase: 69 U/L (ref 39–117)
Bilirubin, Direct: 0 mg/dL (ref 0.0–0.3)
Total Protein: 7 g/dL (ref 6.0–8.3)

## 2012-07-11 MED ORDER — PHENTERMINE HCL 37.5 MG PO TABS: 37.5000 mg | ORAL_TABLET | Freq: Every day | ORAL | Status: DC

## 2012-07-11 NOTE — Progress Notes (Signed)
Subjective:    Patient ID: Patricia Bell, female    DOB: 19-Apr-1981, 32 y.o.   MRN: [id number]  HPI 32 year old [place] American female, nonsmoker, is in for recheck of obesity, type 2 diabetes, and heel spurs. Patient reports doing well. At her last office visit I decrease her glipizide to 5 mg twice a day. Patient continue to see low blood sugars, therefore she has only been taking 5 mg once a day. Last hemoglobin A1c was 5.9. She's lost 26 pounds over the last 3-4 months. She's currently taken phentermine 37.5 once a day and tolerating it well. We have DC'd metformin previously. She has began to exercise but still not routinely. Continues to have pain in her right heel. Heel cups are helping.  Ptient is declining any surgical intervention at this point.   Review of Systems  Constitutional: Negative.   HENT: Negative.   Eyes: Negative.   Respiratory: Negative.   Cardiovascular: Negative.   Gastrointestinal: Negative.   Genitourinary: Negative.   Musculoskeletal: Negative.   Skin: Negative.   Neurological: Negative.   Hematological: Negative.   Psychiatric/Behavioral: Negative.    Past Medical History  Diagnosis Date  . Depression     History   Social History  . Marital Status: Single    Spouse Name: N/A    Number of Children: N/A  . Years of Education: N/A   Occupational History  . Not on file.   Social History Main Topics  . Smoking status: Never Smoker   . Smokeless tobacco: Never Used  . Alcohol Use: No  . Drug Use: No  . Sexually Active: No   Other Topics Concern  . Not on file   Social History Narrative  . No narrative on file    No past surgical history on file.  No family history on file.  Allergies  Allergen Reactions  . Shellfish Allergy Itching, Nausea And Vomiting and Swelling    Current Outpatient Prescriptions on File Prior to Visit  Medication Sig Dispense Refill  . glipiZIDE (GLUCOTROL) 10 MG tablet Take 0.5 tablets (5 mg total) by  mouth 2 (two) times daily before a meal.  60 tablet  3  . glipiZIDE (GLUCOTROL) 10 MG tablet TAKE 1 TABLET TWICE A DAY BEFORE A MEAL  60 tablet  3  . loratadine (CLARITIN) 10 MG tablet Take 1 tablet (10 mg total) by mouth daily.  30 tablet  1  . naproxen sodium (ANAPROX) 220 MG tablet Take 220 mg by mouth daily as needed. For pain       . phentermine (ADIPEX-P) 37.5 MG tablet Take 1 tablet (37.5 mg total) by mouth daily before breakfast.  30 tablet  0    BP 120/84  Pulse 112  Wt 252 lb (114.306 kg)  SpO2 98%chart     Objective:   Physical Exam  Constitutional: She is oriented to person, place, and time. She appears well-developed and well-nourished.  HENT:  Right Ear: External ear normal.  Left Ear: External ear normal.  Nose: Nose normal.  Mouth/Throat: Oropharynx is clear and moist.  Neck: Normal range of motion. Neck supple.  Cardiovascular: Normal rate, regular rhythm and normal heart sounds.   Pulmonary/Chest: Effort normal and breath sounds normal.  Abdominal: Soft. Bowel sounds are normal.  Musculoskeletal: Normal range of motion.       Monofilament intact   Neurological: She is alert and oriented to person, place, and time. She has normal reflexes. No cranial nerve deficit. Coordination normal.  Skin: Skin is warm and dry.  Psychiatric: She has a normal mood and affect.          Assessment & Plan:  Assessment:   1. Type 2 Diabetes 2. Obesity 3. Heel Spur, Right foot pain   Plan: Lab sent. DC glipizide. Encouraged healthy diet and exercise. Continue phentermine x1 more month. We'll follow with patient to reassess blood sugars and weight loss. Encouraged my chart for any questions in the interim.

## 2012-07-11 NOTE — Patient Instructions (Signed)
Heel Spur  A heel spur is a hook of bone that can form on the calcaneus (the heel bone and the largest bone of the foot). Heel spurs are often associated with plantar fasciitis and usually come in people who have had the problem for an extended period of time. The cause of the relationship is unknown. The pain associated with them is thought to be caused by an inflammation (soreness and redness) of the plantar fascia rather than the spur itself. The plantar fascia is a thick fibrous like tissue that runs from the calcaneus (heel bone) to the ball of the foot. This strong, tight tissue helps maintain the arch of your foot. It helps distribute the weight across your foot as you walk or run. Stresses placed on the plantar fascia can be tremendous. When it is inflamed normal activities become painful. Pain is worse in the morning after sleeping. After sleeping the plantar fascia is tight. The first movements stretch the fascia and this causes pain. As the tendon loosens, the pain usually gets better. It often returns with too much standing or walking.   About 70% of patients with plantar fasciitis have a heel spur. About half of people without foot pain also have heel spurs.  DIAGNOSIS   The diagnosis of a heel spur is made by X-ray. The X-ray shows a hook of bone protruding from the bottom of the calcaneus at the point where the plantar fascia is attached to the heel bone.   TREATMENT   It is necessary to find out what is causing the stretching of the plantar fascia. If the cause is over-pronation (flat feet), orthotics and proper foot ware may help.   Stretching exercises, losing weight, wearing shoes that have a cushioned heel that absorbs shock, and elevating the heel with the use of a heel cradle, heel cup, or orthotics may all help. Heel cradles and heel cups provide extra comfort and cushion to the heel, and reduce the amount of shock to the sore area.  AVOIDING THE PAIN OF PLANTAR FASCIITIS AND HEEL  SPURS   Consult a sports medicine professional before beginning a new exercise program.   Walking programs offer a good workout. There is a lower chance of overuse injuries common to the runners. There is less impact and less jarring of the joints.   Begin all new exercise programs slowly. If problems or pains develop, decrease the amount of time or distance until you are at a comfortable level.   Wear good shoes and replace them regularly.   Stretch your foot and the heel cords at the back of the ankle (Achilles tendons) both before and after exercise.   Run or exercise on even surfaces that are not hard. For example, asphalt is better than pavement.   Do not run barefoot on hard surfaces.   If using a treadmill, vary the incline.   Do not continue to workout if you have foot or joint problems. Seek professional help if they do not improve.  HOME CARE INSTRUCTIONS    Avoid activities that cause you pain until you recover.   Use ice or cold packs to the problem or painful areas after working out.   Only take over-the-counter or prescription medicines for pain, discomfort, or fever as directed by your caregiver.   Soft shoe inserts or athletic shoes with air or gel sole cushions may be helpful.   If problems continue or become more severe, consult a sports medicine caregiver. Cortisone is a   potent anti-inflammatory medication that may be injected into the painful area. You can discuss this treatment with your caregiver.  MAKE SURE YOU:    Understand these instructions.   Will watch your condition.   Will get help right away if you are not doing well or get worse.  Document Released: 07/04/2005 Document Revised: 08/20/2011 Document Reviewed: 09/05/2005  [employer] Patient Information 2013 [employer].

## 2012-08-11 ENCOUNTER — Encounter: Payer: Self-pay | Admitting: Family

## 2012-08-11 ENCOUNTER — Ambulatory Visit (INDEPENDENT_AMBULATORY_CARE_PROVIDER_SITE_OTHER): Payer: 59 | Admitting: Family

## 2012-08-11 VITALS — BP 122/90 | HR 105 | Wt 247.0 lb

## 2012-08-11 DIAGNOSIS — Z3009 Encounter for other general counseling and advice on contraception: Secondary | ICD-10-CM

## 2012-08-11 DIAGNOSIS — E669 Obesity, unspecified: Secondary | ICD-10-CM

## 2012-08-11 DIAGNOSIS — E119 Type 2 diabetes mellitus without complications: Secondary | ICD-10-CM

## 2012-08-11 MED ORDER — PHENTERMINE HCL 37.5 MG PO TABS: 37.5000 mg | ORAL_TABLET | Freq: Every day | ORAL | Status: DC

## 2012-08-11 NOTE — Progress Notes (Signed)
  Subjective:    Patient ID: Patricia Bell, female    DOB: June 12, 1980, 32 y.o.   MRN: [id number]  HPI 32 year old [place] American female, nonsmoker is in for recheck on obesity. She's on Adipex 37-1/2 mg once daily. She's tolerating the medication well. His more sexually active but no cardiovascular exercise. She has lost nearly 60 pounds at this point. Has been on Adipex 5 months. Her A1c has gone from 10.2-5.9 and is not currently taken any medication for diabetes. Fasting blood sugar readings are between 80s to 90s.  Patient is requesting a referral to have Mirena inserted.  Review of Systems  Constitutional: Negative.   Respiratory: Negative.   Cardiovascular: Negative.   Gastrointestinal: Negative.   Endocrine: Negative.   Genitourinary: Negative.   Musculoskeletal: Negative.   Psychiatric/Behavioral: Negative.    Past Medical History  Diagnosis Date  . Depression     History   Social History  . Marital Status: Single    Spouse Name: N/A    Number of Children: N/A  . Years of Education: N/A   Occupational History  . Not on file.   Social History Main Topics  . Smoking status: Never Smoker   . Smokeless tobacco: Never Used  . Alcohol Use: No  . Drug Use: No  . Sexually Active: No   Other Topics Concern  . Not on file   Social History Narrative  . No narrative on file    History reviewed. No pertinent past surgical history.  History reviewed. No pertinent family history.  Allergies  Allergen Reactions  . Shellfish Allergy Itching, Nausea And Vomiting and Swelling    Current Outpatient Prescriptions on File Prior to Visit  Medication Sig Dispense Refill  . loratadine (CLARITIN) 10 MG tablet Take 1 tablet (10 mg total) by mouth daily.  30 tablet  1  . naproxen sodium (ANAPROX) 220 MG tablet Take 220 mg by mouth daily as needed. For pain        No current facility-administered medications on file prior to visit.    BP 122/90  Pulse 105  Wt 247 lb  (112.038 kg)  BMI 42.38 kg/m2  SpO2 98%chart    Objective:   Physical Exam  Constitutional: She appears well-developed and well-nourished.  HENT:  Right Ear: External ear normal.  Left Ear: External ear normal.  Nose: Nose normal.  Mouth/Throat: Oropharynx is clear and moist.  Neck: Normal range of motion. Neck supple. No thyromegaly present.  Cardiovascular: Normal rate, regular rhythm and normal heart sounds.   Pulmonary/Chest: Effort normal and breath sounds normal.  Abdominal: Soft. Bowel sounds are normal. She exhibits no distension. There is no tenderness. There is no rebound.  Neurological: She is alert.  Skin: Skin is warm and dry.  Psychiatric: She has a normal mood and affect.          Assessment & Plan:  Assessment: 1. Obesity 2. Type 2 diabetes 3. Counseling on birth control  Plan: This will be her final month of Adipex once daily. We will take a 6 week break from the medication and consider resuming it thereafter. Encouraged healthy diet, exercise, sexual activity. Patient is requesting a referral to gynecology to consider Mirena insertion. Last Pap smear was June 2013 and was normal.

## 2012-08-11 NOTE — Patient Instructions (Addendum)
Intrauterine Device Information  An intrauterine device (IUD) is inserted into your uterus and prevents pregnancy. There are 2 types of IUDs available:  · Copper IUD. This type of IUD is wrapped in copper wire and is placed inside the uterus. Copper makes the uterus and fallopian tubes produce a fluid that kills sperm. The copper IUD can stay in place for 10 years.  · Hormone IUD. This type of IUD contains the hormone progestin (synthetic progesterone). The hormone thickens the cervical mucus and prevents sperm from entering the uterus, and it also thins the uterine lining to prevent implantation of a fertilized egg. The hormone can weaken or kill the sperm that get into the uterus. The hormone IUD can stay in place for 5 years.  Your caregiver will make sure you are a good candidate for a contraceptive IUD. Discuss with your caregiver the possible side effects.  ADVANTAGES  · It is highly effective, reversible, long-acting, and low maintenance.  · There are no estrogen-related side effects.  · An IUD can be used when breastfeeding.  · It is not associated with weight gain.  · It works immediately after insertion.  · The copper IUD does not interfere with your female hormones.  · The progesterone IUD can make heavy menstrual periods lighter.  · The progesterone IUD can be used for 5 years.  · The copper IUD can be used for 10 years.  DISADVANTAGES  · The progesterone IUD can be associated with irregular bleeding patterns.  · The copper IUD can make your menstrual flow heavier and more painful.  · You may experience cramping and vaginal bleeding after insertion.  Document Released: 05/01/2004 Document Revised: 08/20/2011 Document Reviewed: 09/30/2010  [employer]® Patient Information ©2013 [employer].

## 2012-08-12 ENCOUNTER — Encounter: Payer: Self-pay | Admitting: Family

## 2012-09-22 ENCOUNTER — Encounter: Payer: Self-pay | Admitting: Family

## 2012-09-25 ENCOUNTER — Ambulatory Visit: Payer: 59 | Admitting: Family

## 2012-09-29 ENCOUNTER — Encounter: Payer: Self-pay | Admitting: Family

## 2012-09-29 ENCOUNTER — Ambulatory Visit (INDEPENDENT_AMBULATORY_CARE_PROVIDER_SITE_OTHER): Payer: 59 | Admitting: Family

## 2012-09-29 VITALS — BP 140/80 | HR 105 | Wt 247.0 lb

## 2012-09-29 DIAGNOSIS — E119 Type 2 diabetes mellitus without complications: Secondary | ICD-10-CM

## 2012-09-29 DIAGNOSIS — F329 Major depressive disorder, single episode, unspecified: Secondary | ICD-10-CM

## 2012-09-29 DIAGNOSIS — F3289 Other specified depressive episodes: Secondary | ICD-10-CM

## 2012-09-29 DIAGNOSIS — J309 Allergic rhinitis, unspecified: Secondary | ICD-10-CM

## 2012-09-29 DIAGNOSIS — E669 Obesity, unspecified: Secondary | ICD-10-CM

## 2012-09-29 MED ORDER — FEXOFENADINE-PSEUDOEPHED ER 180-240 MG PO TB24: 1.0000 | ORAL_TABLET | Freq: Every day | ORAL | Status: DC

## 2012-09-29 MED ORDER — ESCITALOPRAM OXALATE 10 MG PO TABS: 10.0000 mg | ORAL_TABLET | Freq: Every day | ORAL | Status: DC

## 2012-09-29 NOTE — Patient Instructions (Addendum)

## 2012-09-29 NOTE — Progress Notes (Signed)
Subjective:    Patient ID: Patricia Bell, female    DOB: May 18, 1981, 32 y.o.   MRN: [id number]  HPI 32 year old [place] American female, nonsmoker is in for recheck of obesity. She has been on phentermine 37.5 mg once daily and tolerated well. Her weight is stable. She has type 2 diabetes that is well controlled without medication. Over the last year, she's lost approximately 50 pounds. Her last hemoglobin A1c 2 months ago was 5.9. She does exercise in the mornings for approximately 20-30 minutes. Patient reports that she's noticed that her mood has become more depressed lately. She feels like she is in a low paying job and has been stuck beer despite having further education. She has no desire to do anything. Finds herself in bed most of her day when she's off. She has feelings of helplessness and hopelessness. Denies any thoughts of death or dying. She has a history of anxiety in the past, 2003. However, has not had any issue since then.  Patient has a history of allergic rhinitis and usually takes Claritin once daily. However, Claritin has not been effective. She's requesting an alternative.   Review of Systems  Constitutional: Negative.   HENT: Negative.   Respiratory: Negative.   Cardiovascular: Negative.   Endocrine: Negative.   Genitourinary: Negative.   Musculoskeletal: Negative.   Skin: Negative.   Neurological: Negative.   Hematological: Negative.   Psychiatric/Behavioral: Negative.    Past Medical History  Diagnosis Date  . Depression     History   Social History  . Marital Status: Single    Spouse Name: N/A    Number of Children: N/A  . Years of Education: N/A   Occupational History  . Not on file.   Social History Main Topics  . Smoking status: Never Smoker   . Smokeless tobacco: Never Used  . Alcohol Use: No  . Drug Use: No  . Sexually Active: No   Other Topics Concern  . Not on file   Social History Narrative  . No narrative on file    History  reviewed. No pertinent past surgical history.  History reviewed. No pertinent family history.  Allergies  Allergen Reactions  . Shellfish Allergy Itching, Nausea And Vomiting and Swelling    Current Outpatient Prescriptions on File Prior to Visit  Medication Sig Dispense Refill  . naproxen sodium (ANAPROX) 220 MG tablet Take 220 mg by mouth daily as needed. For pain       . loratadine (CLARITIN) 10 MG tablet Take 1 tablet (10 mg total) by mouth daily.  30 tablet  1   No current facility-administered medications on file prior to visit.    BP 140/80  Pulse 105  Wt 247 lb (112.038 kg)  BMI 42.38 kg/m2  SpO2 95%chart     Objective:   Physical Exam  Constitutional: She is oriented to person, place, and time. She appears well-developed and well-nourished.  HENT:  Right Ear: External ear normal.  Left Ear: External ear normal.  Nose: Nose normal.  Mouth/Throat: Oropharynx is clear and moist.  Neck: Normal range of motion. Neck supple. No thyromegaly present.  Cardiovascular: Normal rate, regular rhythm and normal heart sounds.   Pulmonary/Chest: Effort normal and breath sounds normal.  Abdominal: Soft. Bowel sounds are normal.  Musculoskeletal: Normal range of motion.  Neurological: She is alert and oriented to person, place, and time.  Skin: Skin is warm and dry.  Psychiatric: She has a normal mood and affect.  Assessment & Plan:  Assessment:  1. Type 2 diabetes 2. Obesity 3. Depression 4. Allergic rhinitis  Plan: Continue exercise. Allegra-D once daily. Lexapro 10 mg one half tablet daily x4 days then increase to one whole tablet. Consider over-the-counter B12 to help energy levels as well as biotin to help mood. We'll bring patient back for recheck in 3-4 weeks and sooner as needed.

## 2012-09-30 IMAGING — CT CT HEAD W/O CM
2 series · 16 of 30 positions shown, 20 images · non-contrast
Comparison: 03/01/2004

CLINICAL DATA: Headache, dizziness, blurred vision

CT HEAD WITHOUT CONTRAST
TECHNIQUE: Contiguous axial images were obtained from the base of
the skull through the vertex without contrast.

[Series 2: head w/o · axial · non-contrast · 0.43mm/px · z∈[+1308,+1418]mm · 13 of 26 slices shown, 17 images]
[im 2/26  brain]
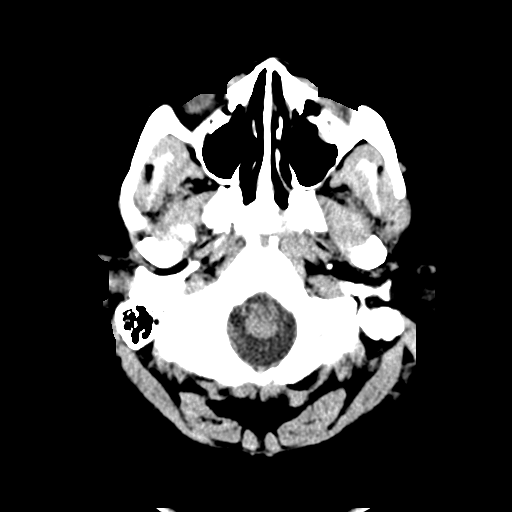
[im 2/26  bone]
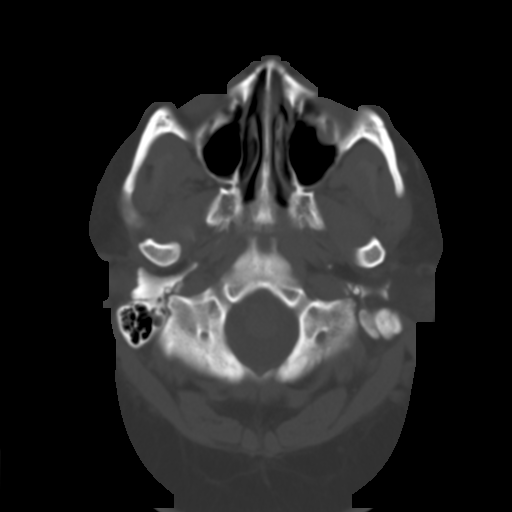
[im 4/26  brain]
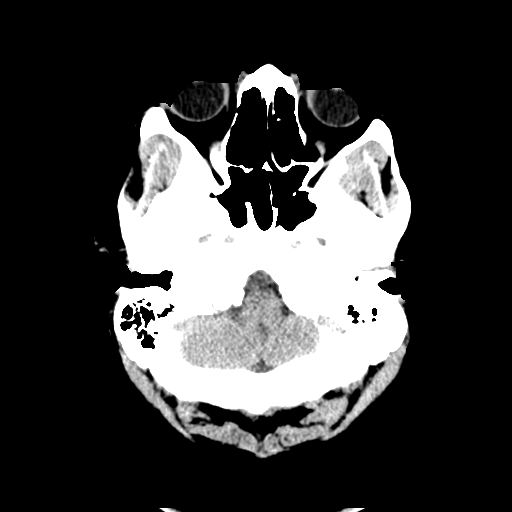
[im 6/26  brain]
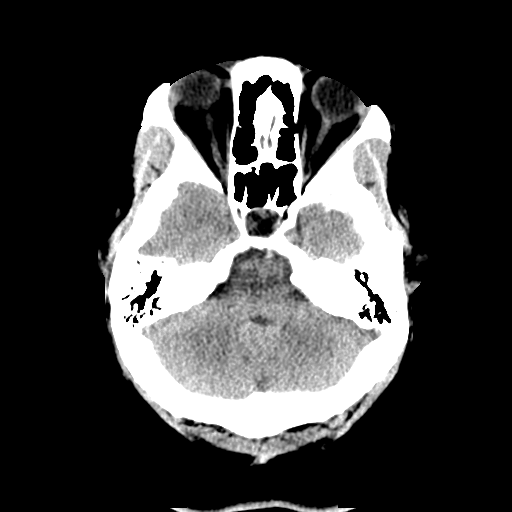
[im 8/26  brain]
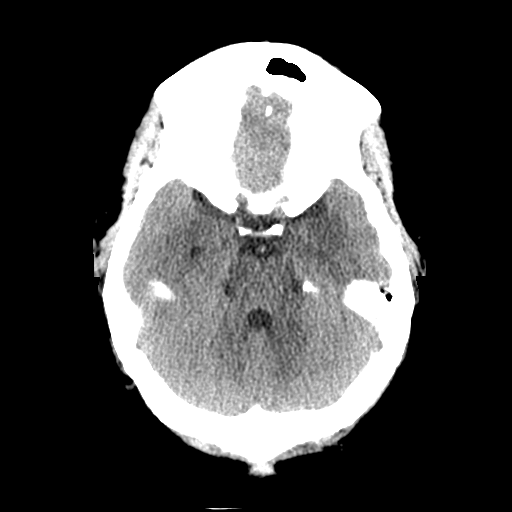
[im 9/26  brain]
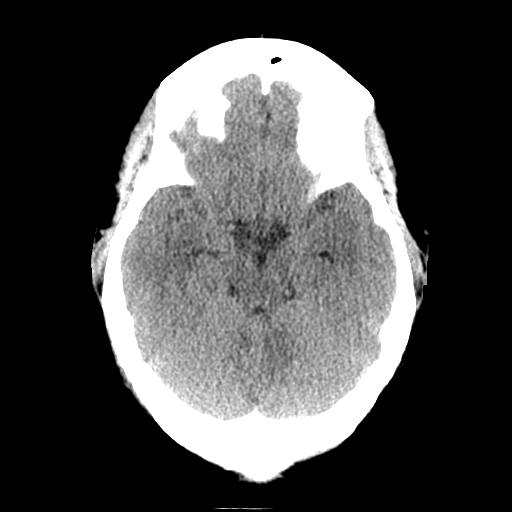
[im 9/26  bone]
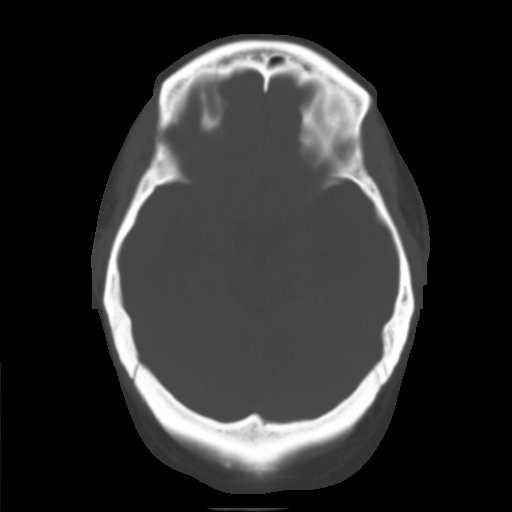
[im 11/26  brain]
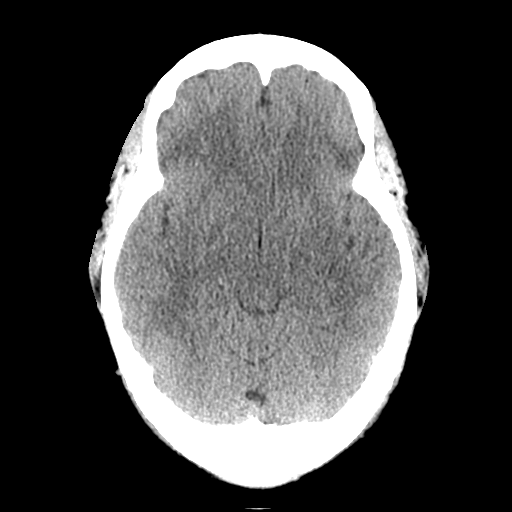
[im 13/26  brain]
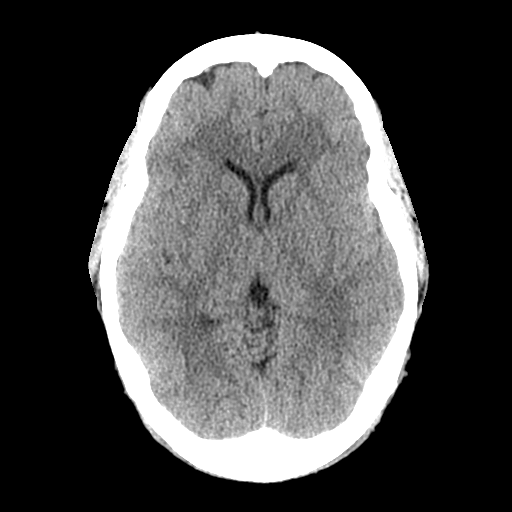
[im 15/26  brain]
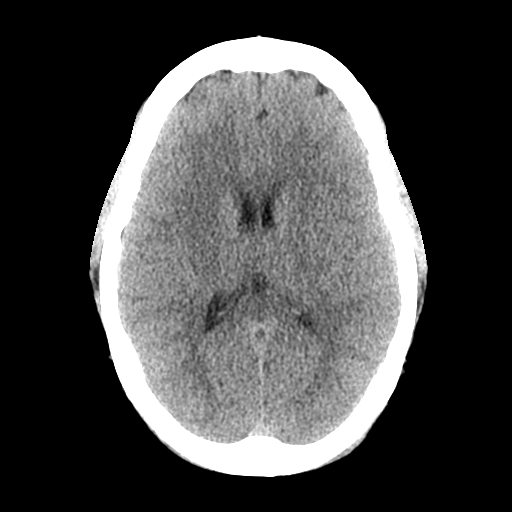
[im 17/26  brain]
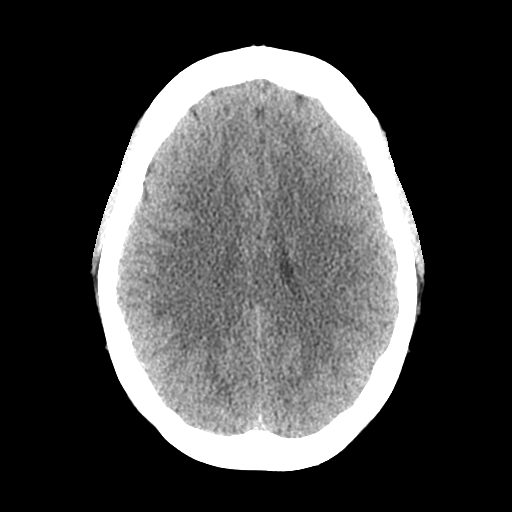
[im 17/26  bone]
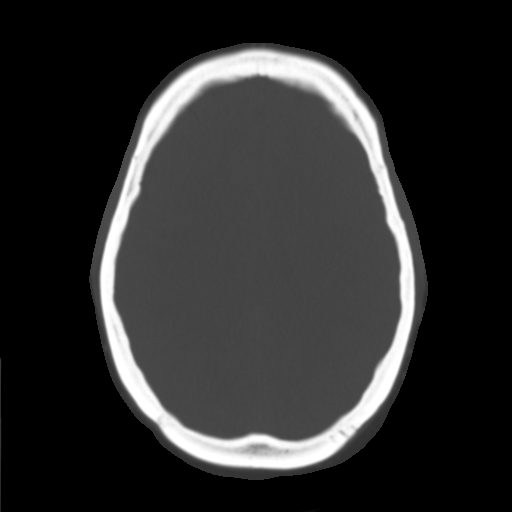
[im 18/26  brain]
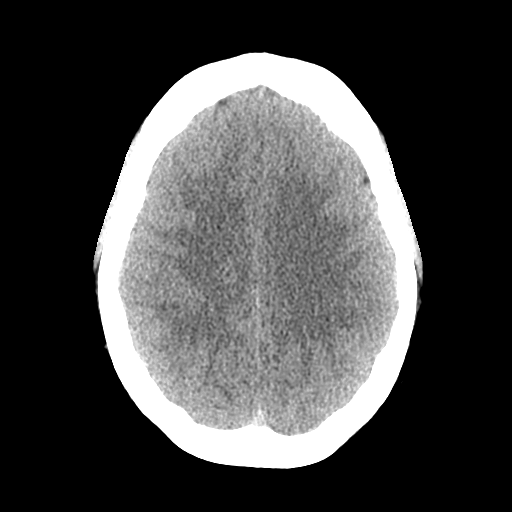
[im 20/26  brain]
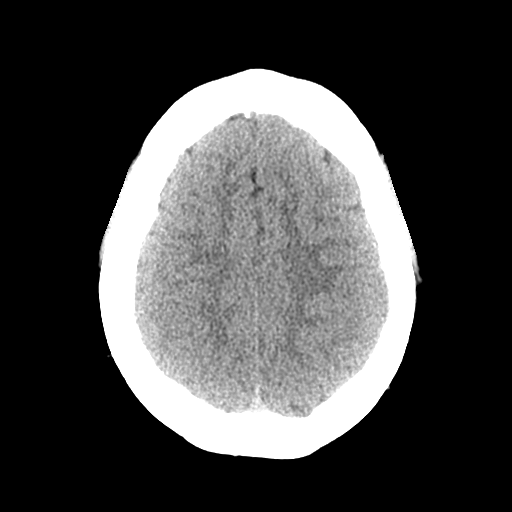
[im 22/26  brain]
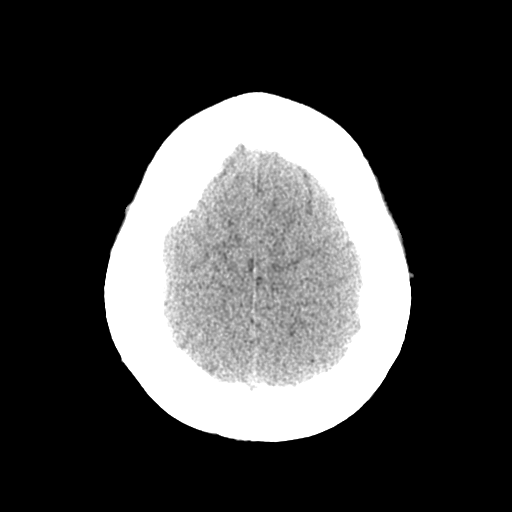
[im 24/26  brain]
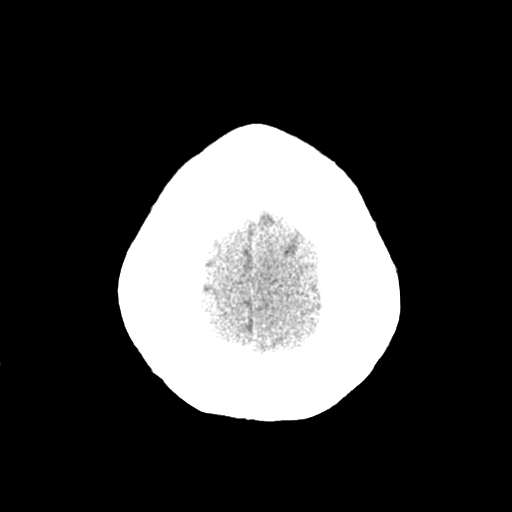
[im 24/26  bone]
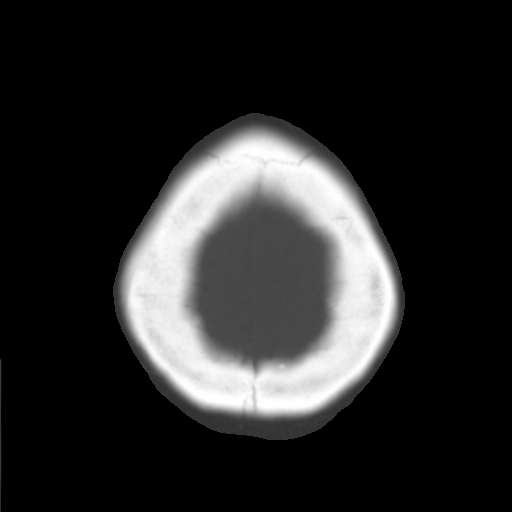

[Series 3: bone windows · axial · 0.43mm/px · z∈[+1308,+1343]mm · 3 of 26 slices shown]
[im 2/26  bone]
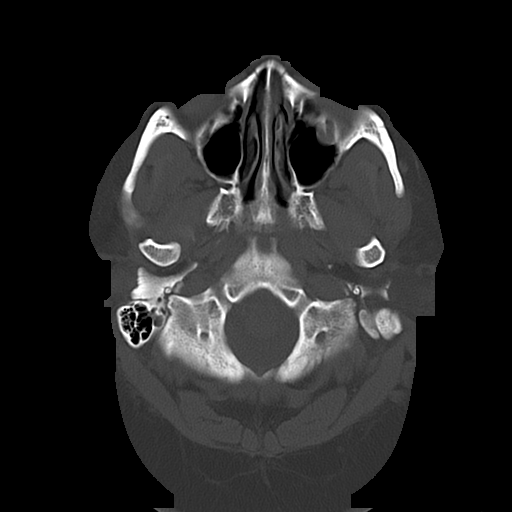
[im 6/26  bone]
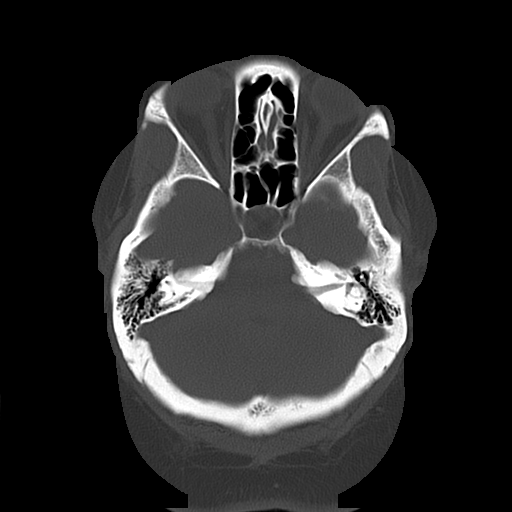
[im 9/26  bone]
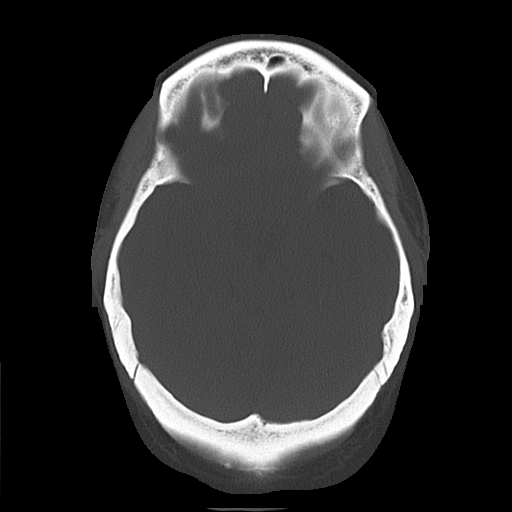

[16 of 30 positions shown; findings below may reference images not displayed]

FINDINGS: No intracranial hemorrhage, mass effect or midline shift.
No hydrocephalus.  The gray and white matter differentiation is
preserved.  No skull fracture.  Paranasal sinuses and mastoid air
cells are unremarkable.

No acute infarction.  No mass lesion is noted on this unenhanced
scan.  No intra or extra-axial fluid collection.
IMPRESSION: No acute intracranial abnormality.

## 2012-11-12 ENCOUNTER — Ambulatory Visit (INDEPENDENT_AMBULATORY_CARE_PROVIDER_SITE_OTHER): Payer: 59 | Admitting: Family

## 2012-11-12 ENCOUNTER — Encounter: Payer: Self-pay | Admitting: Family

## 2012-11-12 VITALS — BP 142/98 | HR 112 | Wt 251.7 lb

## 2012-11-12 DIAGNOSIS — E119 Type 2 diabetes mellitus without complications: Secondary | ICD-10-CM

## 2012-11-12 DIAGNOSIS — E669 Obesity, unspecified: Secondary | ICD-10-CM

## 2012-11-12 DIAGNOSIS — F3289 Other specified depressive episodes: Secondary | ICD-10-CM

## 2012-11-12 DIAGNOSIS — F329 Major depressive disorder, single episode, unspecified: Secondary | ICD-10-CM

## 2012-11-12 LAB — COMPREHENSIVE METABOLIC PANEL
ALT: 18 U/L (ref 0–35)
AST: 16 U/L (ref 0–37)
Albumin: 3.5 g/dL (ref 3.5–5.2)
Alkaline Phosphatase: 75 U/L (ref 39–117)
BUN: 14 mg/dL (ref 6–23)
Potassium: 4.9 mEq/L (ref 3.5–5.1)
Sodium: 140 mEq/L (ref 135–145)
Total Protein: 6.9 g/dL (ref 6.0–8.3)

## 2012-11-12 MED ORDER — PHENTERMINE HCL 37.5 MG PO TABS: 37.5000 mg | ORAL_TABLET | Freq: Every day | ORAL | Status: DC

## 2012-11-12 NOTE — Progress Notes (Signed)
Subjective:    Patient ID: Patricia Bell, female    DOB: 1980/10/21, 32 y.o.   MRN: [id number]  HPI Pt is a 32 year old [place] American female who presents to PCP for follow up of obesity, diabetes and depression. Pt reports a recent weight gain related to stress eating and lack of physical activity. Pt states she does not engage in much physical activity due to a previously diagnosed R heel spur causing pain with activity. Pt reports an improvement in depression, but states she is very stressed from work.   Review of Systems  Constitutional: Negative.   HENT: Negative.   Eyes: Negative.   Respiratory: Negative.   Cardiovascular: Negative.   Gastrointestinal: Negative.   Endocrine: Negative.   Genitourinary: Negative.   Musculoskeletal: Negative.        R heel pain  Allergic/Immunologic: Negative.   Neurological: Negative.   Hematological: Negative.   Psychiatric/Behavioral:       Stress       Past Medical History  Diagnosis Date  . Depression     History   Social History  . Marital Status: Single    Spouse Name: N/A    Number of Children: N/A  . Years of Education: N/A   Occupational History  . Not on file.   Social History Main Topics  . Smoking status: Never Smoker   . Smokeless tobacco: Never Used  . Alcohol Use: No  . Drug Use: No  . Sexually Active: No   Other Topics Concern  . Not on file   Social History Narrative  . No narrative on file    No past surgical history on file.  No family history on file.  Allergies  Allergen Reactions  . Shellfish Allergy Itching, Nausea And Vomiting and Swelling    Current Outpatient Prescriptions on File Prior to Visit  Medication Sig Dispense Refill  . escitalopram (LEXAPRO) 10 MG tablet Take 1 tablet (10 mg total) by mouth daily.  30 tablet  3  . fexofenadine-pseudoephedrine (ALLEGRA-D 24 HOUR) 180-240 MG per 24 hr tablet Take 1 tablet by mouth daily.  30 tablet  3  . naproxen sodium (ANAPROX) 220 MG  tablet Take 220 mg by mouth daily as needed. For pain       . loratadine (CLARITIN) 10 MG tablet Take 1 tablet (10 mg total) by mouth daily.  30 tablet  1   No current facility-administered medications on file prior to visit.    BP 142/98  Pulse 112  Wt 251 lb 11.2 oz (114.17 kg)  BMI 43.18 kg/m2  SpO2 98%chart Objective:   Physical Exam  Constitutional: She is oriented to person, place, and time. She appears well-developed and well-nourished.  HENT:  Head: Normocephalic and atraumatic.  Eyes: Pupils are equal, round, and reactive to light.  Neck: Normal range of motion.  Cardiovascular: Normal rate, regular rhythm and normal heart sounds.   Pulmonary/Chest: Effort normal and breath sounds normal.  Abdominal: Soft. Bowel sounds are normal.  Musculoskeletal: Normal range of motion. She exhibits edema and tenderness.       Right foot: She exhibits tenderness and swelling.  Neurological: She is alert and oriented to person, place, and time.  Skin: Skin is warm and dry.     Informed consent obtained and the patient's right footwas prepped with betadine. Local anesthesia was obtained with topical spray. Then 20 mg of Depo-Medrol and 1/2 cc of lidocaine was injected into the plantar aspect of the right  foot. . The patient tolerated the procedure without complications. Post injection care discussed with patient.     Assessment & Plan:  1. Obesity 2. Diabetes Type II 3. Depression-improved 4. Heel Spur  Pt educated on need for diet and exercise. Per patient request she was restarted on Adipex, but will be closely monitored for diet and exercise activity. Lab work including HgbA1C was ordered to assess status of diabetes.

## 2012-12-18 ENCOUNTER — Ambulatory Visit: Payer: 59 | Admitting: Family

## 2013-01-12 ENCOUNTER — Encounter: Payer: Self-pay | Admitting: Family

## 2013-01-12 ENCOUNTER — Emergency Department (HOSPITAL_COMMUNITY)
Admission: EM | Admit: 2013-01-12 | Discharge: 2013-01-12 | Discharge status: Against Medical Advice | Payer: 59 | Attending: Emergency Medicine | Admitting: Emergency Medicine

## 2013-01-12 ENCOUNTER — Encounter (HOSPITAL_COMMUNITY): Payer: Self-pay | Admitting: *Deleted

## 2013-01-12 DIAGNOSIS — M79605 Pain in left leg: Secondary | ICD-10-CM

## 2013-01-12 DIAGNOSIS — F329 Major depressive disorder, single episode, unspecified: Secondary | ICD-10-CM | POA: Insufficient documentation

## 2013-01-12 DIAGNOSIS — M79609 Pain in unspecified limb: Secondary | ICD-10-CM | POA: Insufficient documentation

## 2013-01-12 DIAGNOSIS — E119 Type 2 diabetes mellitus without complications: Secondary | ICD-10-CM | POA: Insufficient documentation

## 2013-01-12 DIAGNOSIS — F3289 Other specified depressive episodes: Secondary | ICD-10-CM | POA: Insufficient documentation

## 2013-01-12 HISTORY — DX: Type 2 diabetes mellitus without complications: E11.9

## 2013-01-12 NOTE — ED Provider Notes (Signed)
  CSN: [id number]     Arrival date & time 01/12/13  0316 History     First MD Initiated Contact with Patient 01/12/13 0601     Chief Complaint  Patient presents with  . Groin Pain   (Consider location/radiation/quality/duration/timing/severity/associated sxs/prior Treatment) HPI Comments: Patient presents to the emergency department with chief complaints of left sided leg and groin pain. She states that she noticed pain yesterday while walking in the park. She states that the pain starts on her inner thigh, and radiates down the back of her leg. She states the pain is 8/10. She is tried taking Aleve with little relief.  She denies any MOI or trauma.  The history is provided by the patient. No language interpreter was used.    Past Medical History  Diagnosis Date  . Depression   . Diabetes mellitus without complication    History reviewed. No pertinent past surgical history. No family history on file. History  Substance Use Topics  . Smoking status: Never Smoker   . Smokeless tobacco: Never Used  . Alcohol Use: Yes     Comment: occ   OB History   Grav Para Term Preterm Abortions TAB SAB Ect Mult Living                 Review of Systems  All other systems reviewed and are negative.    Allergies  Shellfish allergy  Home Medications   Current Outpatient Rx  Name  Route  Sig  Dispense  Refill  . naproxen sodium (ANAPROX) 220 MG tablet   Oral   Take 220 mg by mouth daily as needed. For pain           BP 144/78  Pulse 79  Temp(Src) 98.3 F (36.8 C) (Oral)  Resp 20  Ht 5\' 4"  (1.626 m)  Wt 245 lb (111.131 kg)  BMI 42.03 kg/m2  SpO2 100%  LMP 12/23/2012 Physical Exam  Nursing note and vitals reviewed. Constitutional: She is oriented to person, place, and time. She appears well-developed and well-nourished.  HENT:  Head: Normocephalic and atraumatic.  Eyes: Conjunctivae and EOM are normal.  Neck: Normal range of motion.  Cardiovascular: Normal rate.    Pulmonary/Chest: Effort normal.  Abdominal: She exhibits no distension.  Musculoskeletal: Normal range of motion.  Neurological: She is alert and oriented to person, place, and time.  Skin: Skin is dry.  Psychiatric: She has a normal mood and affect. Her behavior is normal. Judgment and thought content normal.    ED Course   Procedures (including critical care time)  Labs Reviewed - No data to display No results found. 1. Leg pain, left     MDM  Patient with left leg pain, suspect muscle strain.  But examination incomplete because chaperone was not present.  When I had found a chaperone, approx. 10-15 minutes following seeing the patient, the patient had left the facility.  I had clearly told the patient that I would need to go get the chaperone.  She understood, and did not voice any concern.  As the patient left the facility prior to completing exam, she will be discharged AMA.  Roxy Horseman, PA-C 01/12/13 [phone]

## 2013-01-12 NOTE — ED Notes (Signed)
[id number]<JY> Expected date:<BR> Expected time:<BR> Means of arrival:<BR> Comments:<BR>

## 2013-01-12 NOTE — ED Notes (Signed)
Pt states that she was walking yesterday afternoon and felt a sharp pain to the left groin area that radiates to left leg; pt states that the pain has been consistent since it began; pt states it a stabbing pain to the groin and a throbbing pain that radiates down her leg; pt denies injury while walking

## 2013-01-13 NOTE — ED Provider Notes (Signed)
Medical screening examination/treatment/procedure(s) were performed by non-physician practitioner and as supervising physician I was immediately available for consultation/collaboration.    Celene Kras, MD 01/13/13 [phone]

## 2013-01-19 ENCOUNTER — Other Ambulatory Visit (INDEPENDENT_AMBULATORY_CARE_PROVIDER_SITE_OTHER): Payer: 59

## 2013-01-19 DIAGNOSIS — E669 Obesity, unspecified: Secondary | ICD-10-CM

## 2013-01-19 DIAGNOSIS — E119 Type 2 diabetes mellitus without complications: Secondary | ICD-10-CM

## 2013-01-19 DIAGNOSIS — Z0000 Encounter for general adult medical examination without abnormal findings: Secondary | ICD-10-CM

## 2013-01-19 LAB — POCT URINALYSIS DIPSTICK
Bilirubin, UA: NEGATIVE
Glucose, UA: NEGATIVE
Ketones, UA: NEGATIVE

## 2013-01-19 LAB — CBC WITH DIFFERENTIAL/PLATELET
Eosinophils Relative: 3.6 % (ref 0.0–5.0)
HCT: 36.1 % (ref 36.0–46.0)
Lymphs Abs: 3.1 10*3/uL (ref 0.7–4.0)
MCV: 73.4 fl — ABNORMAL LOW (ref 78.0–100.0)
Monocytes Absolute: 0.6 10*3/uL (ref 0.1–1.0)
Platelets: 286 10*3/uL (ref 150.0–400.0)
WBC: 10.5 10*3/uL (ref 4.5–10.5)

## 2013-01-19 LAB — HEMOGLOBIN A1C: Hgb A1c MFr Bld: 6.2 % (ref 4.6–6.5)

## 2013-01-19 LAB — LIPID PANEL: HDL: 45.8 mg/dL (ref >39.00)

## 2013-01-19 LAB — MICROALBUMIN / CREATININE URINE RATIO
Microalb Creat Ratio: 0.3 mg/g (ref 0.0–30.0)
Microalb, Ur: 0.8 mg/dL (ref 0.0–1.9)

## 2013-01-19 LAB — BASIC METABOLIC PANEL
BUN: 13 mg/dL (ref 6–23)
Calcium: 8.3 mg/dL — ABNORMAL LOW (ref 8.4–10.5)
GFR: 91.76 mL/min (ref >60.00)
Glucose, Bld: 113 mg/dL — ABNORMAL HIGH (ref 70–99)
Sodium: 138 mEq/L (ref 135–145)

## 2013-01-19 LAB — HEPATIC FUNCTION PANEL
AST: 15 U/L (ref 0–37)
Albumin: 3.5 g/dL (ref 3.5–5.2)
Total Bilirubin: 0.4 mg/dL (ref 0.3–1.2)

## 2013-01-19 LAB — TSH: TSH: 2.24 u[IU]/mL (ref 0.35–5.50)

## 2013-01-26 ENCOUNTER — Other Ambulatory Visit (HOSPITAL_COMMUNITY)
Admission: RE | Admit: 2013-01-26 | Discharge: 2013-01-26 | Disposition: A | Discharge status: Home / Self Care | Payer: 59 | Source: Ambulatory Visit | Attending: Family | Admitting: Family

## 2013-01-26 ENCOUNTER — Ambulatory Visit (INDEPENDENT_AMBULATORY_CARE_PROVIDER_SITE_OTHER): Payer: 59 | Admitting: Family

## 2013-01-26 ENCOUNTER — Encounter: Payer: Self-pay | Admitting: Family

## 2013-01-26 VITALS — BP 132/82 | HR 81 | Ht 64.0 in | Wt 261.6 lb

## 2013-01-26 DIAGNOSIS — Z124 Encounter for screening for malignant neoplasm of cervix: Secondary | ICD-10-CM

## 2013-01-26 DIAGNOSIS — Z01419 Encounter for gynecological examination (general) (routine) without abnormal findings: Secondary | ICD-10-CM | POA: Insufficient documentation

## 2013-01-26 DIAGNOSIS — N760 Acute vaginitis: Secondary | ICD-10-CM | POA: Insufficient documentation

## 2013-01-26 DIAGNOSIS — E119 Type 2 diabetes mellitus without complications: Secondary | ICD-10-CM

## 2013-01-26 DIAGNOSIS — Z0000 Encounter for general adult medical examination without abnormal findings: Secondary | ICD-10-CM

## 2013-01-26 DIAGNOSIS — Z113 Encounter for screening for infections with a predominantly sexual mode of transmission: Secondary | ICD-10-CM | POA: Insufficient documentation

## 2013-01-26 NOTE — Patient Instructions (Signed)
2000 Calorie Diabetic Diet The 2000 calorie diabetic diet is designed for eating up to 2000 calories each day. Following this diet and making healthy meal choices can help improve overall health. It controls blood glucose (sugar) levels. It can also lower blood pressure and cholesterol. SERVING SIZES Measuring foods and serving sizes helps to make sure you are getting the right amount of food. The list below tells how big or small some common serving sizes are.  1 oz.........4 stacked dice.  3 oz.........Deck of cards.  1 tsp........Tip of little finger.  1 tbs........Thumb.  2 tbs........Golf ball.   cup.......Half of a fist.  1 cup........A fist. GUIDELINES FOR CHOOSING FOODS The goal of this diet is to eat a variety of foods and limit calories to 2000 each day. This can be done by choosing foods that are low in calories and fat. The diet also suggests eating small amounts of food often. Doing this helps control your blood glucose levels so they do not get too high or too low. Each meal or snack should contain a protein food source to help you feel more satisfied and to stabilize your blood glucose. Try to eat about the same amount of food around the same time each day. This includes weekend days, travel days, and days off work. Space your meals about 4 to 5 hours apart and add a snack between them if you wish. For example, a daily food plan could include breakfast, a morning snack, lunch, dinner, and an evening snack. Healthy meals and snacks include whole grains, vegetables, fruits, lean meats, poultry, fish, and dairy products. As you plan your meals, choose a variety of foods. Choose from the bread and starches, vegetables, fruit, dairy, and meat/protein groups. Examples of foods from each group are listed below with their suggested serving sizes. Use measuring cups and spoons to become familiar with what a healthy portion looks like. Bread and Starches Each serving equals 15 grams of  carbohydrates.  1 slice bread.   bagel.   cup or 1 cup cold cereal (unsweetened).   cup hot cereal or mashed potatoes.  1 small potato (size of a computer mouse).   cup cooked pasta or rice.   English muffin.  1 cup broth-based soup.  3 cups popcorn.  4 to 6 whole-wheat crackers.   cup cooked beans, peas, or corn. Vegetables Each serving equals 5 grams of carbohydrates.   cup cooked vegetables.  1 cup raw vegetables.   cup tomato juice. Fruit Each serving equals 15 grams of carbohydrates.  1 small apple, banana, or orange.  1  cup watermelon or strawberries.   cup applesauce (no sugar added).  2 tbs raisins.   banana.   cup unsweetened canned fruit.   cup unsweetened fruit juice. Dairy Each serving equals 12 to 15 grams of carbohydrates.  1 cup fat-free milk.  6 oz artificially sweetened yogurt.  1 cup buttermilk.  1 cup soy milk. Meat/Protein  1 large egg.  2 to 3 oz meat, poultry, or fish.   cup cottage cheese.  1 tbs peanut butter.   cup tofu.  1 oz cheese.   cup tuna canned in water. SAMPLE 2000 CALORIE DIET PLAN Breakfast  1 English muffin (2 carb servings).  Reduced fat cream cheese, 1 tbs.  1 scrambled egg.   grapefruit (1 carb serving).  Fat-free milk, 1 cup (1 carb serving). Morning Snack  Artificially sweetened yogurt, 6 oz (1 carb serving).  2 tbs chopped nuts.  1   small peach (1 carb serving). Lunch  Grilled chicken sandwich.  1 hamburger bun (2 carb servings).  2 oz chicken breast.  1 lettuce leaf.  2 slices tomato.  Reduced fat mayonnaise, 1 tbs.  Carrot sticks, 1 cup.  Celery, 1 cup.  1 small apple (1 carb serving).  Fat-free milk, 1 cup (1 carb serving). Afternoon Snack   cup low-fat cottage cheese.  1  cups strawberries (1 carb serving). Dinner  Steak fajitas.  2 oz lean steak.  1 whole-wheat tortilla, 8 inches (1  carb servings).  Shredded lettuce,   cup.  2 slices tomato.  Salsa,  cup.  Low-fat sour cream, 2 tbs.  Brown rice,  cup (1 carb serving).  1 small orange (1 carb serving). Evening Snack  4 reduced fat whole-wheat crackers (1 carb serving).  1 tbs peanut butter.  12 to 15 grapes (1 carb serving). MEAL PLAN Use this worksheet to help you make a daily meal plan based on the 2000 calorie diabetic diet suggestions. The total amount of carbohydrates in your meal or snack is more important than making sure you include all of the food groups at every meal or snack. If you are using this plan to help you control your blood glucose, you may interchange carbohydrate containing foods (dairy, starches, and fruits). Choose a variety of fresh foods of varying colors and flavors. You can choose from the following foods to build your day's meals:  11 Starches.  4 Vegetables.  3 Fruits.  3 Dairy.  8 oz Meat.  Up to 6 Fats. Your dietician can use this worksheet to help you decide how many servings and what types of foods are right for you. BREAKFAST Food Group and Servings / Food Choice Starches ___________________________________________ Dairy ______________________________________________ Fruit ______________________________________________ Meat ______________________________________________ Fat________________________________________________ LUNCH Food Group and Servings / Food Choice Starch _____________________________________________ Meat ______________________________________________ Vegetables _________________________________________ Fruit ______________________________________________ Dairy______________________________________________ Fat________________________________________________ Aura Fey Food Group and Servings / Food  Choice Starch________________________________________________ Meat_________________________________________________ Fruit__________________________________________________ Linford Arnold Group and Servings / Food Choice Starches ____________________________________________ Meat _______________________________________________ Dairy _______________________________________________ Vegetables __________________________________________ Fruit ________________________________________________ Fat_________________________________________________ Lollie Sails Food Group and Servings / Food Choice Fruit _______________________________________________ Meat _______________________________________________ Starch ______________________________________________ DAILY TOTALS Starches ________________________ Vegetables ______________________ Fruit ___________________________ Dairy ___________________________ Meat ___________________________ Fat _____________________________ Document Released: 12/18/2004 Document Revised: 08/20/2011 Document Reviewed: 01/03/2009 [employer] Patient Information 2014 [employer].   Diabetes and Exercise Regular exercise is important and can help:   Control blood glucose (sugar).  Decrease blood pressure.    Control blood lipids (cholesterol, triglycerides).  Improve overall health. BENEFITS FROM EXERCISE  Improved fitness.  Improved flexibility.  Improved endurance.  Increased bone density.  Weight control.  Increased muscle strength.  Decreased body fat.  Improvement of the body's use of insulin, a hormone.  Increased insulin sensitivity.  Reduction of insulin needs.  Reduced stress and tension.  Helps you feel better. People with diabetes who add exercise to their lifestyle gain additional benefits, including:  Weight loss.  Reduced appetite.  Improvement of the body's use of blood glucose.  Decreased risk factors for heart  disease:  Lowering of cholesterol and triglycerides.  Raising the level of good cholesterol (high-density lipoproteins, HDL).  Lowering blood sugar.  Decreased blood pressure. TYPE 1 DIABETES AND EXERCISE  Exercise will usually lower your blood glucose.  If blood glucose is greater than 240 mg/dl, check urine ketones. If ketones are present, do not exercise.  Location of the insulin injection sites may need to be adjusted with exercise. Avoid  injecting insulin into areas of the body that will be exercised. For example, avoid injecting insulin into:  The arms when playing tennis.  The legs when jogging. For more information, discuss this with your caregiver.  Keep a record of:  Food intake.  Type and amount of exercise.  Expected peak times of insulin action.  Blood glucose levels. Do this before, during, and after exercise. Review your records with your caregiver. This will help you to develop guidelines for adjusting food intake and insulin amounts.  TYPE 2 DIABETES AND EXERCISE  Regular physical activity can help control blood glucose.  Exercise is important because it may:  Increase the body's sensitivity to insulin.  Improve blood glucose control.  Exercise reduces the risk of heart disease. It decreases serum cholesterol and triglycerides. It also lowers blood pressure.  Those who take insulin or oral hypoglycemic agents should watch for signs of hypoglycemia. These signs include dizziness, shaking, sweating, chills, and confusion.  Body water is lost during exercise. It must be replaced. This will help to avoid loss of body fluids (dehydration) or heat stroke. Be sure to talk to your caregiver before starting an exercise program to make sure it is safe for you. Remember, any activity is better than none.  Document Released: 08/18/2003 Document Revised: 08/20/2011 Document Reviewed: 12/02/2008 [facility] Patient Information 2014 [place], [place].

## 2013-01-26 NOTE — Progress Notes (Signed)
Subjective:    Patient ID: Patricia Bell, female    DOB: 08-12-1980, 32 y.o.   MRN: [id number]  HPI 32 year old [place] American female, nonsmoker, is in for complete physical exam with Pap smear. Has concerns of weight gain. She reports she's exercising 30 minutes 3 times a week. However, she has difficulty controlling portion size and snacking. Her blood glucose has been in the low 100s. Not currently taking any medication for type 2 diabetes. It's been controlled with exercise and diet.   Review of Systems  Constitutional: Negative.   Eyes: Negative.   Respiratory: Negative.   Cardiovascular: Negative.   Gastrointestinal: Negative.   Endocrine: Negative.   Genitourinary: Negative.   Musculoskeletal: Negative.   Skin: Negative.   Allergic/Immunologic: Negative.   Neurological: Negative.   Hematological: Negative.   Psychiatric/Behavioral: Negative.    Past Medical History  Diagnosis Date  . Depression   . Diabetes mellitus without complication     History   Social History  . Marital Status: Single    Spouse Name: N/A    Number of Children: N/A  . Years of Education: N/A   Occupational History  . Not on file.   Social History Main Topics  . Smoking status: Never Smoker   . Smokeless tobacco: Never Used  . Alcohol Use: Yes     Comment: occ  . Drug Use: No  . Sexual Activity: No   Other Topics Concern  . Not on file   Social History Narrative  . No narrative on file    History reviewed. No pertinent past surgical history.  History reviewed. No pertinent family history.  Allergies  Allergen Reactions  . Shellfish Allergy Itching, Nausea And Vomiting and Swelling    Current Outpatient Prescriptions on File Prior to Visit  Medication Sig Dispense Refill  . naproxen sodium (ANAPROX) 220 MG tablet Take 220 mg by mouth daily as needed. For pain        No current facility-administered medications on file prior to visit.    BP 132/82  Pulse 81  Ht 5'  4" (1.626 m)  Wt 261 lb 9.6 oz (118.661 kg)  BMI 44.88 kg/m2  LMP 07/07/2014chart     Objective:   Physical Exam  Constitutional: She is oriented to person, place, and time. She appears well-developed and well-nourished.  Up 14 lbs  HENT:  Right Ear: External ear normal.  Left Ear: External ear normal.  Nose: Nose normal.  Mouth/Throat: Oropharynx is clear and moist.  Eyes: Conjunctivae and EOM are normal. Pupils are equal, round, and reactive to light. Right eye exhibits no discharge.  Neck: Normal range of motion. Neck supple. No thyromegaly present.  Cardiovascular: Normal rate, normal heart sounds and intact distal pulses.   Pulmonary/Chest: Effort normal and breath sounds normal.  Abdominal: Soft. Bowel sounds are normal. She exhibits no distension. There is no tenderness. There is no rebound and no guarding.  Genitourinary: Vagina normal and uterus normal. No vaginal discharge found.  Musculoskeletal: Normal range of motion. She exhibits no edema and no tenderness.  Neurological: She is alert and oriented to person, place, and time. She has normal reflexes.  Skin: Skin is warm and dry. No rash noted.  Psychiatric: She has a normal mood and affect.          Assessment & Plan:  Assessment: 1. Complete physical exam 2. Pap smear  3. Type 2 diabetes 4. Obesity 5. Weight gain  Plan: Strongly encouraged her to decrease portion  sizes. Increase exercise 1 additional day. I will bring patient back in 4 weeks to recheck her weight to be sure is not continuing to do well. We have discussed portion sizes in great depth today. She is aware that we think that caused her to have to go back on medication for type 2 diabetes.

## 2013-02-10 ENCOUNTER — Ambulatory Visit: Payer: 59 | Admitting: Licensed Clinical Social Worker

## 2013-03-02 ENCOUNTER — Ambulatory Visit (INDEPENDENT_AMBULATORY_CARE_PROVIDER_SITE_OTHER): Payer: 59 | Admitting: Family

## 2013-03-02 ENCOUNTER — Encounter: Payer: Self-pay | Admitting: Family

## 2013-03-02 NOTE — Patient Instructions (Addendum)

## 2013-03-02 NOTE — Progress Notes (Signed)
  Subjective:    Patient ID: Patricia Bell, female    DOB: 1980/06/15, 32 y.o.   MRN: [id number]  HPI  32 year old [place] American female, nonsmoker is in for recheck of obesity. She had lost nearly 60 pounds after finding out she was type II diabetic. She is not currently on any medications related to weight reduction. However, over the last several months she's been gaining more and more weak. Today, she is up for additional pounds. Patient does admit to splurging on double stuffed oreos. Reports drinking cocktails a few times a week. Rides a bike on the weekends but admits to walking on the hills.  Review of Systems  Constitutional: Positive for unexpected weight change.  HENT: Negative.   Respiratory: Negative.   Cardiovascular: Negative.   Gastrointestinal: Negative.   Endocrine: Negative.   Genitourinary: Negative.   Musculoskeletal: Negative.   Skin: Negative.   Neurological: Negative.   Psychiatric/Behavioral: Negative.    Past Medical History  Diagnosis Date  . Depression   . Diabetes mellitus without complication     History   Social History  . Marital Status: Single    Spouse Name: N/A    Number of Children: N/A  . Years of Education: N/A   Occupational History  . Not on file.   Social History Main Topics  . Smoking status: Never Smoker   . Smokeless tobacco: Never Used  . Alcohol Use: Yes     Comment: occ  . Drug Use: No  . Sexual Activity: No   Other Topics Concern  . Not on file   Social History Narrative  . No narrative on file    History reviewed. No pertinent past surgical history.  History reviewed. No pertinent family history.  Allergies  Allergen Reactions  . Shellfish Allergy Itching, Nausea And Vomiting and Swelling    Current Outpatient Prescriptions on File Prior to Visit  Medication Sig Dispense Refill  . naproxen sodium (ANAPROX) 220 MG tablet Take 220 mg by mouth daily as needed. For pain        No current  facility-administered medications on file prior to visit.    BP 120/70  Pulse 81  Wt 265 lb 9.6 oz (120.475 kg)  BMI 45.57 kg/m2chart    Objective:   Physical Exam  Constitutional: She is oriented to person, place, and time. She appears well-developed and well-nourished.  HENT:  Head: Normocephalic.  Right Ear: External ear normal.  Left Ear: External ear normal.  Nose: Nose normal.  Mouth/Throat: Oropharynx is clear and moist.  Eyes: Conjunctivae and EOM are normal. Pupils are equal, round, and reactive to light.  Neck: Normal range of motion. Neck supple.  Cardiovascular: Normal rate, regular rhythm and normal heart sounds.   Pulmonary/Chest: Effort normal and breath sounds normal.  Abdominal: Soft. Bowel sounds are normal.  Musculoskeletal: Normal range of motion.  Neurological: She is alert and oriented to person, place, and time. She has normal reflexes. She displays normal reflexes. No cranial nerve deficit. Coordination normal.  Skin: Skin is warm and dry.  Psychiatric: She has a normal mood and affect.          Assessment & Plan:  Assessment: 1. Obesity 2. Type 2 diabetes 3. Weight gain  Plan: Suggest a nutritionist for bariatric clinic. Patient declined. Patient admits to being more active physically. I have suggested that she download [employer] app to her cell phone. Recheck in 2 months and sooner as needed.

## 2013-03-16 ENCOUNTER — Ambulatory Visit (INDEPENDENT_AMBULATORY_CARE_PROVIDER_SITE_OTHER): Payer: 59 | Admitting: Family

## 2013-03-16 ENCOUNTER — Encounter: Payer: Self-pay | Admitting: Family

## 2013-03-16 VITALS — BP 142/86 | HR 94 | Wt 269.7 lb

## 2013-03-16 DIAGNOSIS — E669 Obesity, unspecified: Secondary | ICD-10-CM

## 2013-03-16 DIAGNOSIS — N6310 Unspecified lump in the right breast, unspecified quadrant: Secondary | ICD-10-CM

## 2013-03-16 DIAGNOSIS — N63 Unspecified lump in unspecified breast: Secondary | ICD-10-CM

## 2013-03-16 MED ORDER — PHENTERMINE HCL 37.5 MG PO CAPS: 37.5000 mg | ORAL_CAPSULE | ORAL | Status: DC

## 2013-03-16 NOTE — Progress Notes (Signed)
Subjective:    Patient ID: Patricia Bell, female    DOB: 1980/11/02, 32 y.o.   MRN: [id number]  HPI 32 year old [place] American female, nonsmoker is an complaints of right breast pain x1 week. She reports a history of fibrocystic breasts in the past. Can feel a palpable mass to the right breast. Denies any drainage or discharge. No skin changes. No family history of breast cancer. Does have a family history of fibrocystic breasts.  Patient continues to gain weight. She had lost 60 pounds on phentermine in the past. She's been off several months. He reports he is exercising and trying to watch her diet. He continues to decline seeing a nutritionist. Reports cycling 3-4 times per week.   Review of Systems  Constitutional: Positive for unexpected weight change.       Weight gain  HENT: Negative.   Respiratory: Negative.   Cardiovascular: Negative.   Gastrointestinal: Negative.   Endocrine: Negative.   Genitourinary: Negative.   Musculoskeletal: Negative.   Skin: Negative.        Mass to the right breast  Allergic/Immunologic: Negative.   Neurological: Negative.   Psychiatric/Behavioral: Negative.    Past Medical History  Diagnosis Date  . Depression   . Diabetes mellitus without complication     History   Social History  . Marital Status: Single    Spouse Name: N/A    Number of Children: N/A  . Years of Education: N/A   Occupational History  . Not on file.   Social History Main Topics  . Smoking status: Never Smoker   . Smokeless tobacco: Never Used  . Alcohol Use: Yes     Comment: occ  . Drug Use: No  . Sexual Activity: No   Other Topics Concern  . Not on file   Social History Narrative  . No narrative on file    History reviewed. No pertinent past surgical history.  History reviewed. No pertinent family history.  Allergies  Allergen Reactions  . Shellfish Allergy Itching, Nausea And Vomiting and Swelling    Current Outpatient Prescriptions on File  Prior to Visit  Medication Sig Dispense Refill  . naproxen sodium (ANAPROX) 220 MG tablet Take 220 mg by mouth daily as needed. For pain        No current facility-administered medications on file prior to visit.    BP 142/86  Pulse 94  Wt 269 lb 11.2 oz (122.335 kg)  BMI 46.27 kg/m2chart     Objective:   Physical Exam  Constitutional: She is oriented to person, place, and time. She appears well-developed and well-nourished.  Neck: Normal range of motion. Neck supple.  Cardiovascular: Normal rate, regular rhythm and normal heart sounds.   Pulmonary/Chest: Effort normal and breath sounds normal. Right breast exhibits mass and tenderness. Right breast exhibits no inverted nipple, no nipple discharge and no skin change. Left breast exhibits no inverted nipple, no mass, no nipple discharge, no skin change and no tenderness.    Musculoskeletal: Normal range of motion.  Neurological: She is alert and oriented to person, place, and time.  Skin: Skin is warm and dry.  Psychiatric: She has a normal mood and affect.          Assessment & Plan:  Assessment: 1. Right breast mass 2. Right breast pain 3. Obesity  Plan: Ordered diagnostic mammogram with right breast ultrasound. Decrease caffeine intake. Start phentermine 37.5 mg once daily. If she does not lose weight we will discontinue the medication. Exercise 3-4  times per week for one hour.

## 2013-03-16 NOTE — Patient Instructions (Signed)
Fibrocystic Breast Changes Fibrocystic breast changes is a non-cancerous(benign) condition that about half of all women have at some time in their life. It is also called benign breast disease and mammary dysplasia. It may also be called fibrocystic breast disease, but it is not really a disease. It is a common condition that occurs when women go through the hormonal changes during their menstrual cycle, between the ages of 71 to 85. Menopausal women do not have this problem, unless they are on hormone therapy. It can affect one or both breasts. This is not a sign that you will later get cancer. CAUSES  Overgrowth of cells lining the milk ducts, or enlarged lobules in the breast, cause the breast duct to become blocked. The duct then fills up with fluid. This is like a small balloon filled with water. It is called a cyst. Over time, with repeated inflammation there is a tendency to form scar tissue. This scar tissue becomes the fibrous part of fibrocystic disease. The exact cause of this happening is not known, but it may be related to the female hormones, estrogen and progesterone. Heredity (genetics) may also be a factor in some cases. SYMPTOMS   Tenderness.  Swelling.  Rope-like feeling.  Lumpy breast, one or both sides.  Changes in the size of the breasts, before and after the menstrual period (larger before, smaller after).  Green or dark brown nipple discharge (not blood). Symptoms are usually worse before periods (menstrual cycle) and get better toward the end of menstruation. Usually, it is temporary minor discomfort. But some women have severe pain.  DIAGNOSIS  Check your breasts monthly. The best time to check your breasts is after your period. If you check them during your period, you are more likely to feel the normal glands enlarged, as a result of the hormonal changes that happen right before your period. If you do not have menstrual periods, check your breasts the first day of every  month. Become familiar with the way your own breasts feel. It is then easier to notice if there are changes, such as more tenderness, a new growth, change in breast size, or a change in a lump that has always been there. All breasts lumps need to be investigated, to rule out breast cancer. See your caregiver as soon as possible, if you find a lump. Most breast lumps are not cancerous. Excellent treatment is available for ones that are.  To make a diagnosis, your caregiver will examine your breasts and may recommend other tests, such as:  Mammogram (breast X-ray).  Ultrasound.  MRI (magnetic resonance imaging).  Removing fluid from the cyst with a fine needle, under local anesthesia (aspiration).  Taking a breast tissue sample (breast biopsy). Some questions your caregiver will ask are:  What was the date of your last period?  When did the lump show up?  Is there any discharge from your breast?  Is the breast tender or painful?  Are the symptoms in one or both breasts?  Has the lump changed in size from month-to-month? How long has it been present?  Any family history of breast problems?  Any past breast problems?  Any history of breast surgery?  Are you taking any medications?  When was your last mammogram, and where was it done? TREATMENT   Dietary changes help to prevent or reduce the symptoms of fibrocystic breast changes.  You may need to stop consuming all foods that contain caffeine, such as chocolate, sodas, coffee, and tea.  Reducing sugar and fat in your diet may also help.  Decrease estrogen in your diet. Some sources include commercially raised meats which contain estrogen. Eliminate other natural estrogens.  Birth control pills can also make symptoms worse.  Natural progesterone cream, applied at a dose of 15 to 20 milligrams per day, from ovulation until a day or two before your period returns, may help with returning to normal breast tissue over several  months. Seek advice from your caregiver.  Over-the-counter pain pills may help, as recommended by your caregiver.  Danazol hormone (female-like hormone) is sometimes used. It may cause hair growth and acne.  Needle aspiration can be used, to remove fluid from the cyst.  Surgery may be needed, to remove a large, persistent, and tender cyst.  Evening primrose oil may help with the tenderness and pain. It has linolenic acid that women may not have enough of. HOME CARE INSTRUCTIONS   Examine your breasts after every menstrual period.  If you do not have menstrual periods, examine your breasts the first day of every month.  Wear a firm support bra, especially when exercising.  Decrease or avoid caffeine in your diet.  Decrease the fat and sugar in your diet.  Eat a balanced diet.  Try to see your caregiver after you have a menstrual period.  Before seeing your caregiver, make notes about:  When you have the symptoms.  What types of symptoms you are having.  Medications you are taking.  When and where your last mammogram was taken.  Past breast problems or breast surgery. SEEK MEDICAL CARE IF:   You have been diagnosed with fibrocystic breast changes, and you develop changes in your breast:  Discharge from the nipple, especially bloody discharge.  Pain in the breast that does not go away after your menstrual period.  New lumps or bumps in the breast.  Lumps in your armpit.  Your breast or breasts become enlarged, red, and painful.  You find an isolated lump, even if it is not tender.  You have questions about this condition that have not been answered. Document Released: 03/14/2006 Document Revised: 08/20/2011 Document Reviewed: 06/08/2009 [facility] Patient Information 2014 [place], [place].

## 2013-03-25 ENCOUNTER — Ambulatory Visit
Admission: RE | Admit: 2013-03-25 | Discharge: 2013-03-25 | Disposition: A | Discharge status: Home / Self Care | Payer: 59 | Source: Ambulatory Visit | Attending: Family | Admitting: Family

## 2013-03-25 DIAGNOSIS — N6310 Unspecified lump in the right breast, unspecified quadrant: Secondary | ICD-10-CM

## 2013-04-16 ENCOUNTER — Other Ambulatory Visit: Payer: Self-pay

## 2013-05-04 ENCOUNTER — Encounter: Payer: Self-pay | Admitting: Family

## 2013-05-04 ENCOUNTER — Telehealth: Payer: Self-pay | Admitting: Family

## 2013-05-04 ENCOUNTER — Ambulatory Visit (INDEPENDENT_AMBULATORY_CARE_PROVIDER_SITE_OTHER): Payer: 59 | Admitting: Family

## 2013-05-04 VITALS — BP 136/88 | HR 88 | Wt 255.5 lb

## 2013-05-04 DIAGNOSIS — E669 Obesity, unspecified: Secondary | ICD-10-CM

## 2013-05-04 DIAGNOSIS — E7800 Pure hypercholesterolemia, unspecified: Secondary | ICD-10-CM

## 2013-05-04 DIAGNOSIS — E119 Type 2 diabetes mellitus without complications: Secondary | ICD-10-CM

## 2013-05-04 MED ORDER — PHENTERMINE HCL 37.5 MG PO CAPS: 37.5000 mg | ORAL_CAPSULE | ORAL | Status: DC

## 2013-05-04 NOTE — Patient Instructions (Signed)

## 2013-05-04 NOTE — Progress Notes (Signed)
  Subjective:    Patient ID: Patricia Bell, female    DOB: 06-Apr-1981, 32 y.o.   MRN: [id number]  HPI 32 year old [place] American female, nonsmoker is in today for recheck of type 2 diabetes, obesity. She reports she is doing well. Her weight is down 13 pounds from her last office visit. He's been trying to change her eating habits and exercising more. She also is taking phentermine 37.5 mg once daily and tolerating it well.   Review of Systems  Constitutional: Negative.   HENT: Negative.   Respiratory: Negative.   Cardiovascular: Negative.   Gastrointestinal: Negative.   Endocrine: Negative.   Genitourinary: Negative.   Musculoskeletal: Negative.   Skin: Negative.   Allergic/Immunologic: Negative.   Neurological: Negative.   Hematological: Negative.   Psychiatric/Behavioral: Negative.    Past Medical History  Diagnosis Date  . Depression   . Diabetes mellitus without complication     History   Social History  . Marital Status: Single    Spouse Name: N/A    Number of Children: N/A  . Years of Education: N/A   Occupational History  . Not on file.   Social History Main Topics  . Smoking status: Never Smoker   . Smokeless tobacco: Never Used  . Alcohol Use: Yes     Comment: occ  . Drug Use: No  . Sexual Activity: No   Other Topics Concern  . Not on file   Social History Narrative  . No narrative on file    History reviewed. No pertinent past surgical history.  History reviewed. No pertinent family history.  Allergies  Allergen Reactions  . Shellfish Allergy Itching, Nausea And Vomiting and Swelling    Current Outpatient Prescriptions on File Prior to Visit  Medication Sig Dispense Refill  . naproxen sodium (ANAPROX) 220 MG tablet Take 220 mg by mouth daily as needed. For pain        No current facility-administered medications on file prior to visit.    BP 136/88  Pulse 88  Wt 255 lb 8 oz (115.894 kg)chart     Objective:   Physical Exam   Constitutional: She is oriented to person, place, and time. She appears well-developed and well-nourished.  HENT:  Right Ear: External ear normal.  Left Ear: External ear normal.  Nose: Nose normal.  Mouth/Throat: Oropharynx is clear and moist.  Neck: Normal range of motion. Neck supple. No thyromegaly present.  Cardiovascular: Normal rate, regular rhythm and normal heart sounds.   Pulmonary/Chest: Effort normal and breath sounds normal.  Abdominal: Soft. Bowel sounds are normal.  Musculoskeletal: Normal range of motion.  Neurological: She is alert and oriented to person, place, and time.  Skin: Skin is warm and dry.  Psychiatric: She has a normal mood and affect.          Assessment & Plan:  Assessment: 1. Type 2 diabetes 2. Obesity  Plan: Continue current medications. Encouraged healthy diet and exercise. Monthly self breast exams as might be checked. In the office with any questions or concerns. Recheck in one month as needed.

## 2013-05-04 NOTE — Telephone Encounter (Signed)
Pt is coming on 05/05/13 for Lab.  Can you please enter the orders?

## 2013-05-05 ENCOUNTER — Other Ambulatory Visit: Payer: 59

## 2013-05-05 LAB — COMPREHENSIVE METABOLIC PANEL
Alkaline Phosphatase: 61 U/L (ref 39–117)
BUN: 16 mg/dL (ref 6–23)
Glucose, Bld: 101 mg/dL — ABNORMAL HIGH (ref 70–99)
Sodium: 138 mEq/L (ref 135–145)
Total Bilirubin: 0.3 mg/dL (ref 0.3–1.2)
Total Protein: 6.9 g/dL (ref 6.0–8.3)

## 2013-05-05 LAB — LIPID PANEL
Cholesterol: 156 mg/dL (ref 0–200)
LDL Cholesterol: 102 mg/dL — ABNORMAL HIGH (ref 0–99)
Total CHOL/HDL Ratio: 4
VLDL: 12.2 mg/dL (ref 0.0–40.0)

## 2013-05-05 LAB — CBC WITH DIFFERENTIAL/PLATELET
Basophils Absolute: 0 10*3/uL (ref 0.0–0.1)
Basophils Relative: 0.3 % (ref 0.0–3.0)
Eosinophils Relative: 3.3 % (ref 0.0–5.0)
HCT: 38.2 % (ref 36.0–46.0)
Hemoglobin: 12.6 g/dL (ref 12.0–15.0)
Lymphs Abs: 3 10*3/uL (ref 0.7–4.0)
MCV: 72.7 fl — ABNORMAL LOW (ref 78.0–100.0)
Monocytes Absolute: 0.7 10*3/uL (ref 0.1–1.0)
Monocytes Relative: 6.7 % (ref 3.0–12.0)
Neutrophils Relative %: 59.3 % (ref 43.0–77.0)
Platelets: 282 10*3/uL (ref 150.0–400.0)
RBC: 5.25 Mil/uL — ABNORMAL HIGH (ref 3.87–5.11)
WBC: 9.9 10*3/uL (ref 4.5–10.5)

## 2013-06-03 ENCOUNTER — Other Ambulatory Visit: Payer: Self-pay | Admitting: Family

## 2013-06-12 IMAGING — CR DG FOOT 2V*R*
2 series · 2 of 2 positions shown · non-contrast
Comparison: None.

CLINICAL DATA: Heel pain

RIGHT FOOT - 2 VIEW

[view not recorded (1 of 2)]
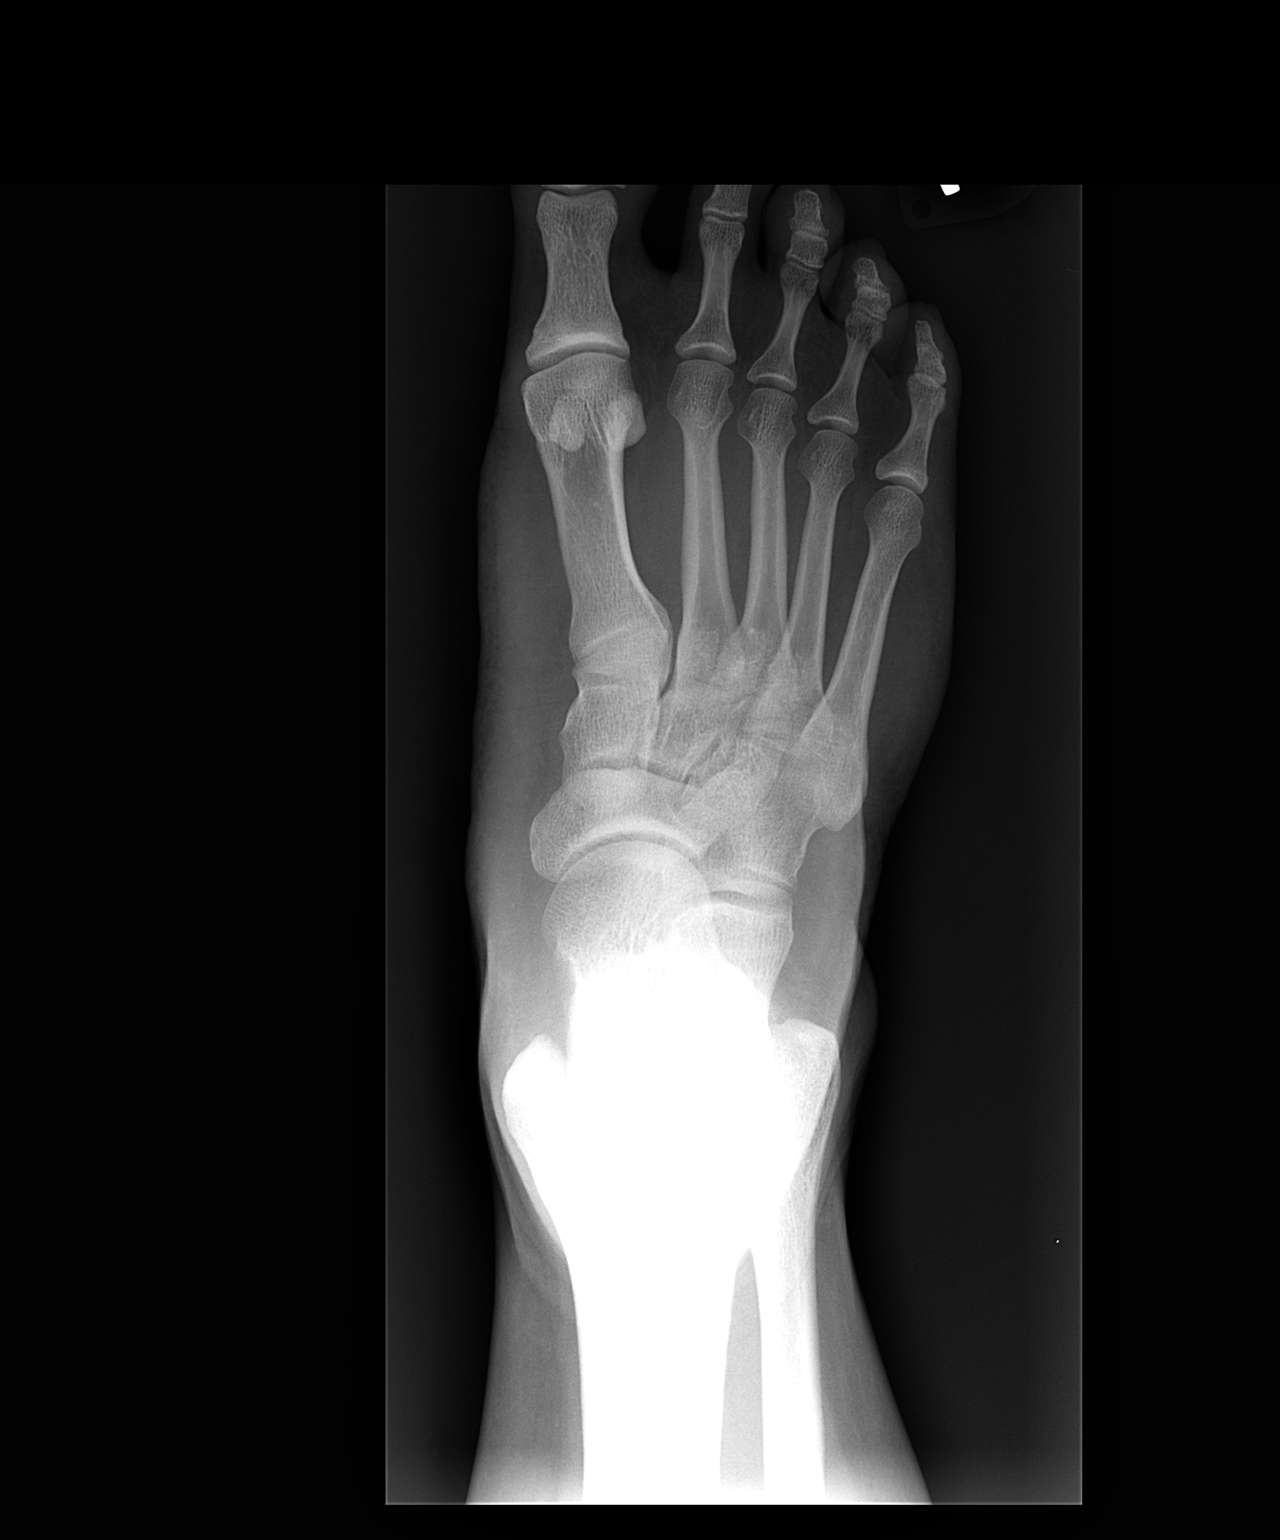

[view not recorded (2 of 2)]
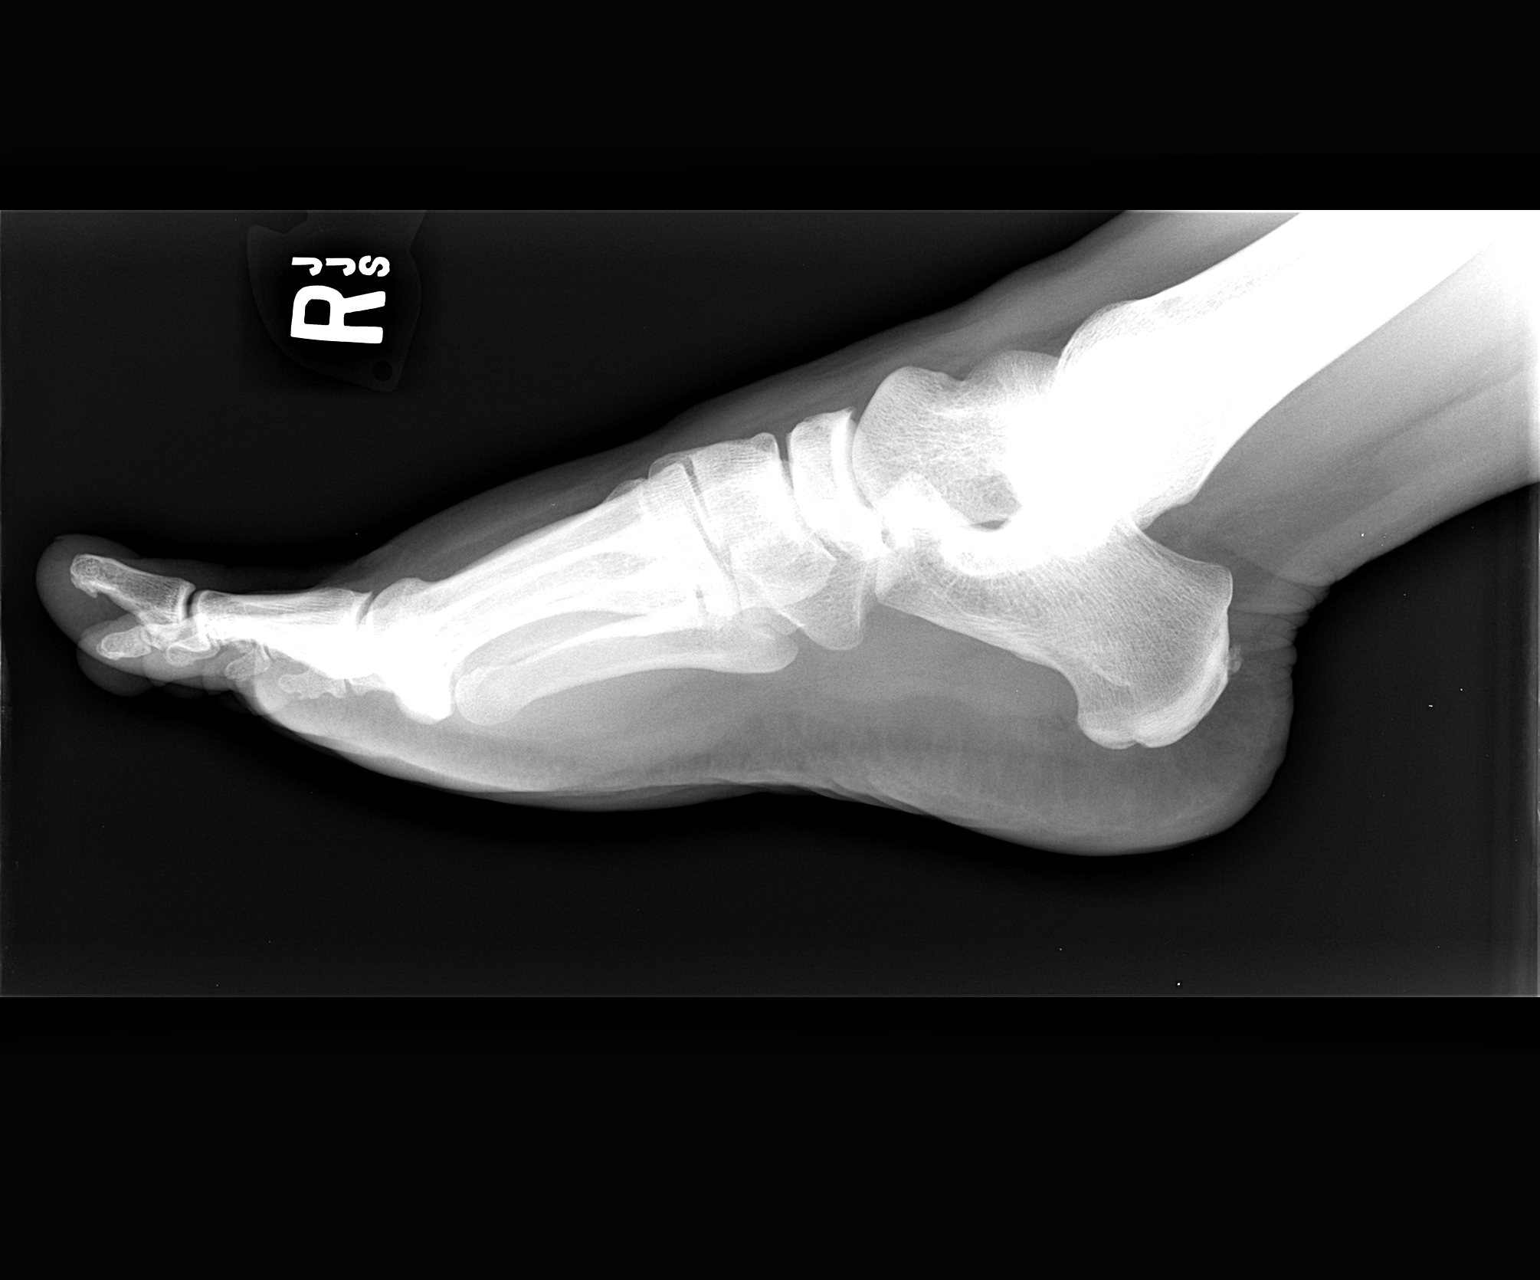

[2 of 2 positions shown; findings below may reference images not displayed]

FINDINGS: Two views of the right foot submitted.  No acute fracture
or subluxation.  Small posterior spur of the calcaneus.
IMPRESSION: No acute fracture or subluxation.  Small posterior spur of the
calcaneus.

## 2013-06-22 ENCOUNTER — Encounter: Payer: Self-pay | Admitting: Family

## 2013-06-22 ENCOUNTER — Ambulatory Visit (INDEPENDENT_AMBULATORY_CARE_PROVIDER_SITE_OTHER): Payer: 59 | Admitting: Family

## 2013-06-22 DIAGNOSIS — E119 Type 2 diabetes mellitus without complications: Secondary | ICD-10-CM

## 2013-06-22 DIAGNOSIS — B354 Tinea corporis: Secondary | ICD-10-CM

## 2013-06-22 MED ORDER — CLOTRIMAZOLE-BETAMETHASONE 1-0.05 % EX CREA: 1.0000 "application " | TOPICAL_CREAM | Freq: Two times a day (BID) | CUTANEOUS | Status: DC

## 2013-06-22 MED ORDER — PHENTERMINE HCL 37.5 MG PO CAPS: 37.5000 mg | ORAL_CAPSULE | ORAL | Status: DC

## 2013-06-22 NOTE — Patient Instructions (Signed)

## 2013-06-22 NOTE — Progress Notes (Signed)
   Subjective:    Patient ID: Patricia Bell, female    DOB: June 17, 1980, 33 y.o.   MRN: [id number]  HPI 33 year old [place] American female, nonsmoker with a history of type 2 diabetes is in for recheck of obesity. She is currently taking phentermine 37.5 mg once daily and tolerating it well. Her weight is down 11 pounds from her last office visit. She is exercising 3 times per week using the elliptical. Her goal weight is 190 pounds by December 2015.  Review of Systems  Constitutional: Negative.   Respiratory: Negative.   Cardiovascular: Negative.   Gastrointestinal: Negative.   Endocrine: Negative.   Genitourinary: Negative.   Musculoskeletal: Negative.   Skin: Positive for rash.       Rash to the neck and breast  Neurological: Negative.   Psychiatric/Behavioral: Negative.    Past Medical History  Diagnosis Date  . Depression   . Diabetes mellitus without complication     History   Social History  . Marital Status: Single    Spouse Name: N/A    Number of Children: N/A  . Years of Education: N/A   Occupational History  . Not on file.   Social History Main Topics  . Smoking status: Never Smoker   . Smokeless tobacco: Never Used  . Alcohol Use: Yes     Comment: occ  . Drug Use: No  . Sexual Activity: No   Other Topics Concern  . Not on file   Social History Narrative  . No narrative on file    History reviewed. No pertinent past surgical history.  History reviewed. No pertinent family history.  Allergies  Allergen Reactions  . Shellfish Allergy Itching, Nausea And Vomiting and Swelling    Current Outpatient Prescriptions on File Prior to Visit  Medication Sig Dispense Refill  . naproxen sodium (ANAPROX) 220 MG tablet Take 220 mg by mouth daily as needed. For pain        No current facility-administered medications on file prior to visit.    BP 124/80  Pulse 90  Wt 244 lb (110.678 kg)chart      Objective:   Physical Exam  Constitutional: She is  oriented to person, place, and time. She appears well-developed and well-nourished.  HENT:  Left Ear: External ear normal.  Neck: Normal range of motion. Neck supple.  Cardiovascular: Normal rate, regular rhythm and normal heart sounds.   Pulmonary/Chest: Effort normal and breath sounds normal.  Abdominal: Soft. Bowel sounds are normal. She exhibits no distension. There is no tenderness. There is no rebound.  Neurological: She is alert and oriented to person, place, and time.  Skin: Skin is warm and dry. Rash noted.  Dry, waxy appearing rash to the anterior neck and between the breast.   Psychiatric: She has a normal mood and affect.      Assessment and Plan:  Assessment:  1. Type 2 diabetes 2. Obesity  Plan:Continue phentermine 37.5 mg once daily. Continue exercising. Recheck in 6 weeks and sooner as needed.

## 2013-06-23 ENCOUNTER — Telehealth: Payer: Self-pay

## 2013-06-23 NOTE — Telephone Encounter (Signed)
Relevant patient education assigned to patient using Emmi. ° °

## 2013-08-20 ENCOUNTER — Ambulatory Visit: Payer: 59 | Admitting: Family

## 2013-08-25 ENCOUNTER — Ambulatory Visit: Payer: 59 | Admitting: Family

## 2013-08-25 ENCOUNTER — Encounter: Payer: Self-pay | Admitting: Family

## 2013-08-25 ENCOUNTER — Ambulatory Visit (INDEPENDENT_AMBULATORY_CARE_PROVIDER_SITE_OTHER): Payer: 59 | Admitting: Family

## 2013-08-25 VITALS — BP 120/84 | HR 118 | Wt 225.0 lb

## 2013-08-25 DIAGNOSIS — E669 Obesity, unspecified: Secondary | ICD-10-CM

## 2013-08-25 MED ORDER — PHENTERMINE HCL 37.5 MG PO CAPS: 37.5000 mg | ORAL_CAPSULE | ORAL | Status: DC

## 2013-08-25 NOTE — Progress Notes (Signed)
Pre visit review using our clinic review tool, if applicable. No additional management support is needed unless otherwise documented below in the visit note. 

## 2013-08-25 NOTE — Patient Instructions (Signed)
Exercise to Stay Healthy Exercise helps you become and stay healthy. EXERCISE IDEAS AND TIPS Choose exercises that:  You enjoy.  Fit into your day. You do not need to exercise really hard to be healthy. You can do exercises at a slow or medium level and stay healthy. You can:  Stretch before and after working out.  Try yoga, Pilates, or tai chi.  Lift weights.  Walk fast, swim, jog, run, climb stairs, bicycle, dance, or rollerskate.  Take aerobic classes. Exercises that burn about 150 calories:  Running 1  miles in 15 minutes.  Playing volleyball for 45 to 60 minutes.  Washing and waxing a car for 45 to 60 minutes.  Playing touch football for 45 minutes.  Walking 1  miles in 35 minutes.  Pushing a stroller 1  miles in 30 minutes.  Playing basketball for 30 minutes.  Raking leaves for 30 minutes.  Bicycling 5 miles in 30 minutes.  Walking 2 miles in 30 minutes.  Dancing for 30 minutes.  Shoveling snow for 15 minutes.  Swimming laps for 20 minutes.  Walking up stairs for 15 minutes.  Bicycling 4 miles in 15 minutes.  Gardening for 30 to 45 minutes.  Jumping rope for 15 minutes.  Washing windows or floors for 45 to 60 minutes. Document Released: 06/30/2010 Document Revised: 08/20/2011 Document Reviewed: 06/30/2010 [employer] Patient Information 2014 [employer].  

## 2013-08-25 NOTE — Progress Notes (Signed)
   Subjective:    Patient ID: Patricia Bell, female    DOB: 1980-11-23, 33 y.o.   MRN: [id number]  HPI 33 year old Patricia Bell female, nonsmoker who presents for follow-up for weight loss.  She has been taking phentemine 37.5mg  since January. She has lost 18 pounds since January. She has bought an elliptical machine and has been working out everyday.      Review of Systems  Constitutional: Positive for activity change and appetite change.       Exercising daily and has decreased appetite  Respiratory: Negative.   Cardiovascular: Negative.   Musculoskeletal: Negative.   Psychiatric/Behavioral: Negative.        Objective:   Physical Exam  Constitutional: She is oriented to person, place, and time. She appears well-developed and well-nourished.  Cardiovascular: Normal rate, regular rhythm and normal heart sounds.   Pulmonary/Chest: Effort normal and breath sounds normal.  Musculoskeletal: Normal range of motion.  Neurological: She is alert and oriented to person, place, and time.  Skin: Skin is warm and dry.  Psychiatric: She has a normal mood and affect. Her behavior is normal. Judgment and thought content normal.          Assessment & Plan:  Assessment 1. Weight Loss  Plan 1. Continue Phentemine 37.5mg  PO daily.  2.Continue exercise program.  3.Return to office in one month.  4.  Contact office for questions or concerns.

## 2013-10-06 ENCOUNTER — Ambulatory Visit: Payer: 59 | Admitting: Family

## 2013-10-21 ENCOUNTER — Ambulatory Visit: Payer: 59 | Admitting: Family

## 2013-11-16 ENCOUNTER — Encounter: Payer: Self-pay | Admitting: Family

## 2013-11-16 ENCOUNTER — Ambulatory Visit (INDEPENDENT_AMBULATORY_CARE_PROVIDER_SITE_OTHER): Payer: 59 | Admitting: Family

## 2013-11-16 VITALS — BP 110/80 | HR 80 | Temp 99.0°F | Wt 218.0 lb

## 2013-11-16 DIAGNOSIS — E669 Obesity, unspecified: Secondary | ICD-10-CM

## 2013-11-16 DIAGNOSIS — IMO0001 Reserved for inherently not codable concepts without codable children: Secondary | ICD-10-CM

## 2013-11-16 DIAGNOSIS — E1165 Type 2 diabetes mellitus with hyperglycemia: Secondary | ICD-10-CM

## 2013-11-16 LAB — HEMOGLOBIN A1C: Hgb A1c MFr Bld: 5.7 % (ref 4.6–6.5)

## 2013-11-16 MED ORDER — PHENTERMINE HCL 37.5 MG PO CAPS: 37.5000 mg | ORAL_CAPSULE | ORAL | Status: DC

## 2013-11-16 NOTE — Patient Instructions (Signed)

## 2013-11-16 NOTE — Progress Notes (Signed)
   Subjective:    Patient ID: Patricia Bell, female    DOB: 1980-09-26, 33 y.o.   MRN: [id number]  HPI 33 y.o. Black female presents today for a weight check since she is taking phentermine 37.5mg . Pt weight check shows a 7 pound weight loss (225 one month ago, 218 today). Reports exercising one hour per day doing walking and eliptical training; she reports eating high fiber foods and avoiding fried foods and fast food. Pt reports that her blood sugars have been running high (191) in the mornings and late afternoons. She also reports polydipsia. Denies chest palpitations, chest pain, diarrhea, fatigue, fever, and change in appetite.     Review of Systems  Constitutional: Negative.   HENT: Negative.   Respiratory: Negative.   Cardiovascular: Negative.   Gastrointestinal: Negative.   Endocrine: Positive for polydipsia.  Musculoskeletal: Negative.   Skin: Negative.   Allergic/Immunologic: Negative.   Neurological: Negative.   Hematological: Negative.   Psychiatric/Behavioral: Negative.    Past Medical History  Diagnosis Date  . Depression   . Diabetes mellitus without complication     History   Social History  . Marital Status: Single    Spouse Name: N/A    Number of Children: N/A  . Years of Education: N/A   Occupational History  . Not on file.   Social History Main Topics  . Smoking status: Never Smoker   . Smokeless tobacco: Never Used  . Alcohol Use: Yes     Comment: occ  . Drug Use: No  . Sexual Activity: No   Other Topics Concern  . Not on file   Social History Narrative  . No narrative on file    No past surgical history on file.  No family history on file.  Allergies  Allergen Reactions  . Shellfish Allergy Itching, Nausea And Vomiting and Swelling    Current Outpatient Prescriptions on File Prior to Visit  Medication Sig Dispense Refill  . clotrimazole-betamethasone (LOTRISONE) cream Apply 1 application topically 2 (two) times daily.  45 g  0  .  naproxen sodium (ANAPROX) 220 MG tablet Take 220 mg by mouth daily as needed. For pain        No current facility-administered medications on file prior to visit.    BP 110/80  Pulse 80  Temp(Src) 99 F (37.2 C) (Oral)  Wt 218 lb (98.884 kg)  LMP 05/15/2015chart    Objective:   Physical Exam  Constitutional: She is oriented to person, place, and time. She appears well-developed and well-nourished. She is active.  HENT:  Head: Normocephalic.  Cardiovascular: Normal rate, regular rhythm, normal heart sounds and normal pulses.   Pulmonary/Chest: Effort normal and breath sounds normal.  Abdominal: Soft. Normal appearance and bowel sounds are normal.  Neurological: She is alert and oriented to person, place, and time.  Skin: Skin is warm, dry and intact.  Psychiatric: She has a normal mood and affect. Her speech is normal and behavior is normal.          Assessment & Plan:  Kathy was seen today for follow-up.  Diagnoses and associated orders for this visit:  Obesity, unspecified  Diabetes mellitus type 2, uncontrolled, without complications - Hemoglobin A1c  Other Orders - phentermine 37.5 MG capsule; Take 1 capsule (37.5 mg total) by mouth every morning.   Education: continue to exercise daily. Eat healthy diet. Report any further polyuria, polydipsia.   Follow up: 1 month

## 2013-11-16 NOTE — Progress Notes (Signed)
Pre visit review using our clinic review tool, if applicable. No additional management support is needed unless otherwise documented below in the visit note. 

## 2013-12-16 ENCOUNTER — Ambulatory Visit: Payer: 59 | Admitting: Family

## 2014-01-05 ENCOUNTER — Ambulatory Visit: Payer: 59 | Admitting: Family

## 2014-01-20 ENCOUNTER — Ambulatory Visit: Payer: 59 | Admitting: Family

## 2014-01-27 ENCOUNTER — Ambulatory Visit: Payer: 59 | Admitting: Family

## 2014-02-05 ENCOUNTER — Encounter: Payer: Self-pay | Admitting: Family

## 2014-02-05 ENCOUNTER — Ambulatory Visit (INDEPENDENT_AMBULATORY_CARE_PROVIDER_SITE_OTHER): Payer: 59 | Admitting: Family

## 2014-02-05 VITALS — BP 118/80 | HR 113 | Wt 216.0 lb

## 2014-02-05 DIAGNOSIS — F329 Major depressive disorder, single episode, unspecified: Secondary | ICD-10-CM

## 2014-02-05 DIAGNOSIS — F3289 Other specified depressive episodes: Secondary | ICD-10-CM

## 2014-02-05 DIAGNOSIS — E669 Obesity, unspecified: Secondary | ICD-10-CM

## 2014-02-05 DIAGNOSIS — E119 Type 2 diabetes mellitus without complications: Secondary | ICD-10-CM

## 2014-02-05 LAB — COMPREHENSIVE METABOLIC PANEL
ALT: 15 U/L (ref 0–35)
AST: 15 U/L (ref 0–37)
Albumin: 3.6 g/dL (ref 3.5–5.2)
Alkaline Phosphatase: 64 U/L (ref 39–117)
BILIRUBIN TOTAL: 0.2 mg/dL (ref 0.2–1.2)
BUN: 13 mg/dL (ref 6–23)
CALCIUM: 8.4 mg/dL (ref 8.4–10.5)
CHLORIDE: 106 meq/L (ref 96–112)
CO2: 24 mEq/L (ref 19–32)
CREATININE: 1.1 mg/dL (ref 0.4–1.2)
GFR: 74.94 mL/min (ref >60.00)
Glucose, Bld: 95 mg/dL (ref 70–99)
Potassium: 3.9 mEq/L (ref 3.5–5.1)
Sodium: 136 mEq/L (ref 135–145)
Total Protein: 7 g/dL (ref 6.0–8.3)

## 2014-02-05 LAB — LIPID PANEL
Cholesterol: 151 mg/dL (ref 0–200)
HDL: 40.9 mg/dL (ref >39.00)
LDL Cholesterol: 99 mg/dL (ref 0–99)
NonHDL: 110.1
TRIGLYCERIDES: 58 mg/dL (ref 0.0–149.0)
Total CHOL/HDL Ratio: 4
VLDL: 11.6 mg/dL (ref 0.0–40.0)

## 2014-02-05 NOTE — Patient Instructions (Signed)
Cardiac Diet This diet can help prevent heart disease and stroke. Many factors influence your heart health, including eating and exercise habits. Coronary risk rises a lot with abnormal blood fat (lipid) levels. Cardiac meal planning includes limiting unhealthy fats, increasing healthy fats, and making other small dietary changes. General guidelines are as follows:  Adjust calorie intake to reach and maintain desirable body weight.  Limit total fat intake to less than 30% of total calories. Saturated fat should be less than 7% of calories.  Saturated fats are found in animal products and in some vegetable products. Saturated vegetable fats are found in coconut oil, cocoa butter, palm oil, and palm kernel oil. Read labels carefully to avoid these products as much as possible. Use butter in moderation. Choose tub margarines and oils that have 2 grams of fat or less. Good cooking oils are canola and olive oils.  Practice low-fat cooking techniques. Do not fry food. Instead, broil, bake, boil, steam, grill, roast on a rack, stir-fry, or microwave it. Other fat reducing suggestions include:  Remove the skin from poultry.  Remove all visible fat from meats.  Skim the fat off stews, soups, and gravies before serving them.  Steam vegetables in water or broth instead of sauting them in fat.  Avoid foods with trans fat (or hydrogenated oils), such as commercially fried foods and commercially baked goods. Commercial shortening and deep-frying fats will contain trans fat.  Increase intake of fruits, vegetables, whole grains, and legumes to replace foods high in fat.  Increase consumption of nuts, legumes, and seeds to at least 4 servings weekly. One serving of a legume equals  cup, and 1 serving of nuts or seeds equals  cup.  Choose whole grains more often. Have 3 servings per day (a serving is 1 ounce [oz]).  Eat 4 to 5 servings of vegetables per day. A serving of vegetables is 1 cup of raw leafy  vegetables;  cup of raw or cooked cut-up vegetables;  cup of vegetable juice.  Eat 4 to 5 servings of fruit per day. A serving of fruit is 1 medium whole fruit;  cup of dried fruit;  cup of fresh, frozen, or canned fruit;  cup of 100% fruit juice.  Increase your intake of dietary fiber to 20 to 30 grams per day. Insoluble fiber may help lower your risk of heart disease and may help curb your appetite.  Soluble fiber binds cholesterol to be removed from the blood. Foods high in soluble fiber are dried beans, citrus fruits, oats, apples, bananas, broccoli, Brussels sprouts, and eggplant.  Try to include foods fortified with plant sterols or stanols, such as yogurt, breads, juices, or margarines. Choose several fortified foods to achieve a daily intake of 2 to 3 grams of plant sterols or stanols.  Foods with omega-3 fats can help reduce your risk of heart disease. Aim to have a 3.5 oz portion of fatty fish twice per week, such as salmon, mackerel, albacore tuna, sardines, lake trout, or herring. If you wish to take a fish oil supplement, choose one that contains 1 gram of both DHA and EPA.  Limit processed meats to 2 servings (3 oz portion) weekly.  Limit the sodium in your diet to 1500 milligrams (mg) per day. If you have high blood pressure, talk to a registered dietitian about a DASH (Dietary Approaches to Stop Hypertension) eating plan.  Limit sweets and beverages with added sugar, such as soda, to no more than 5 servings per week. One   serving is:   1 tablespoon sugar.  1 tablespoon jelly or jam.   cup sorbet.  1 cup lemonade.   cup regular soda. CHOOSING FOODS Starches  Allowed: Breads: All kinds (wheat, rye, raisin, white, oatmeal, Italian, French, and English muffin bread). Low-fat rolls: English muffins, frankfurter and hamburger buns, bagels, pita bread, tortillas (not fried). Pancakes, waffles, biscuits, and muffins made with recommended oil.  Avoid: Products made with  saturated or trans fats, oils, or whole milk products. Butter rolls, cheese breads, croissants. Commercial doughnuts, muffins, sweet rolls, biscuits, waffles, pancakes, store-bought mixes. Crackers  Allowed: Low-fat crackers and snacks: Animal, graham, rye, saltine (with recommended oil, no lard), oyster, and matzo crackers. Bread sticks, melba toast, rusks, flatbread, pretzels, and light popcorn.  Avoid: High-fat crackers: cheese crackers, butter crackers, and those made with coconut, palm oil, or trans fat (hydrogenated oils). Buttered popcorn. Cereals  Allowed: Hot or cold whole-grain cereals.  Avoid: Cereals containing coconut, hydrogenated vegetable fat, or animal fat. Potatoes / Pasta / Rice  Allowed: All kinds of potatoes, rice, and pasta (such as macaroni, spaghetti, and noodles).  Avoid: Pasta or rice prepared with cream sauce or high-fat cheese. Chow mein noodles, French fries. Vegetables  Allowed: All vegetables and vegetable juices.  Avoid: Fried vegetables. Vegetables in cream, butter, or high-fat cheese sauces. Limit coconut. Fruit in cream or custard. Protein  Allowed: Limit your intake of meat, seafood, and poultry to no more than 6 oz (cooked weight) per day. All lean, well-trimmed beef, veal, pork, and lamb. All chicken and turkey without skin. All fish and shellfish. Wild game: wild duck, rabbit, pheasant, and venison. Egg whites or low-cholesterol egg substitutes may be used as desired. Meatless dishes: recipes with dried beans, peas, lentils, and tofu (soybean curd). Seeds and nuts: all seeds and most nuts.  Avoid: Prime grade and other heavily marbled and fatty meats, such as short ribs, spare ribs, rib eye roast or steak, frankfurters, sausage, bacon, and high-fat luncheon meats, mutton. Caviar. Commercially fried fish. Domestic duck, goose, venison sausage. Organ meats: liver, gizzard, heart, chitterlings, brains, kidney, sweetbreads. Dairy  Allowed: Low-fat  cheeses: nonfat or low-fat cottage cheese (1% or 2% fat), cheeses made with part skim milk, such as mozzarella, farmers, string, or ricotta. (Cheeses should be labeled no more than 2 to 6 grams fat per oz.). Skim (or 1%) milk: liquid, powdered, or evaporated. Buttermilk made with low-fat milk. Drinks made with skim or low-fat milk or cocoa. Chocolate milk or cocoa made with skim or low-fat (1%) milk. Nonfat or low-fat yogurt.  Avoid: Whole milk cheeses, including colby, cheddar, muenster, Monterey Jack, Havarti, Brie, Camembert, American, Swiss, and blue. Creamed cottage cheese, cream cheese. Whole milk and whole milk products, including buttermilk or yogurt made from whole milk, drinks made from whole milk. Condensed milk, evaporated whole milk, and 2% milk. Soups and Combination Foods  Allowed: Low-fat low-sodium soups: broth, dehydrated soups, homemade broth, soups with the fat removed, homemade cream soups made with skim or low-fat milk. Low-fat spaghetti, lasagna, chili, and Spanish rice if low-fat ingredients and low-fat cooking techniques are used.  Avoid: Cream soups made with whole milk, cream, or high-fat cheese. All other soups. Desserts and Sweets  Allowed: Sherbet, fruit ices, gelatins, meringues, and angel food cake. Homemade desserts with recommended fats, oils, and milk products. Jam, jelly, honey, marmalade, sugars, and syrups. Pure sugar candy, such as gum drops, hard candy, jelly beans, marshmallows, mints, and small amounts of dark chocolate.  Avoid: Commercially prepared   cakes, pies, cookies, frosting, pudding, or mixes for these products. Desserts containing whole milk products, chocolate, coconut, lard, palm oil, or palm kernel oil. Ice cream or ice cream drinks. Candy that contains chocolate, coconut, butter, hydrogenated fat, or unknown ingredients. Buttered syrups. Fats and Oils  Allowed: Vegetable oils: safflower, sunflower, corn, soybean, cottonseed, sesame, canola, olive,  or peanut. Non-hydrogenated margarines. Salad dressing or mayonnaise: homemade or commercial, made with a recommended oil. Low or nonfat salad dressing or mayonnaise.  Limit added fats and oils to 6 to 8 tsp per day (includes fats used in cooking, baking, salads, and spreads on bread). Remember to count the "hidden fats" in foods.  Avoid: Solid fats and shortenings: butter, lard, salt pork, bacon drippings. Gravy containing meat fat, shortening, or suet. Cocoa butter, coconut. Coconut oil, palm oil, palm kernel oil, or hydrogenated oils: these ingredients are often used in bakery products, nondairy creamers, whipped toppings, candy, and commercially fried foods. Read labels carefully. Salad dressings made of unknown oils, sour cream, or cheese, such as blue cheese and Roquefort. Cream, all kinds: half-and-half, light, heavy, or whipping. Sour cream or cream cheese (even if "light" or low-fat). Nondairy cream substitutes: coffee creamers and sour cream substitutes made with palm, palm kernel, hydrogenated oils, or coconut oil. Beverages  Allowed: Coffee (regular or decaffeinated), tea. Diet carbonated beverages, mineral water. Alcohol: Check with your caregiver. Moderation is recommended.  Avoid: Whole milk, regular sodas, and juice drinks with added sugar. Condiments  Allowed: All seasonings and condiments. Cocoa powder. "Cream" sauces made with recommended ingredients.  Avoid: Carob powder made with hydrogenated fats. SAMPLE MENU Breakfast   cup orange juice   cup oatmeal  1 slice toast  1 tsp margarine  1 cup skim milk Lunch  Turkey sandwich with 2 oz turkey, 2 slices bread  Lettuce and tomato slices  Fresh fruit  Carrot sticks  Coffee or tea Snack  Fresh fruit or low-fat crackers Dinner  3 oz lean ground beef  1 baked potato  1 tsp margarine   cup asparagus  Lettuce salad  1 tbs non-creamy dressing   cup peach slices  1 cup skim milk Document Released:  03/06/2008 Document Revised: 11/27/2011 Document Reviewed: 07/28/2013 Exit[employer] Patient Information 2015 [employer]. This information is not intended to replace advice given to you by your health care provider. Make sure you discuss any questions you have with your health care provider.  

## 2014-02-05 NOTE — Progress Notes (Signed)
Pre visit review using our clinic review tool, if applicable. No additional management support is needed unless otherwise documented below in the visit note. 

## 2014-02-05 NOTE — Progress Notes (Signed)
Subjective:    Patient ID: Patricia Bell, female    DOB: Nov 07, 1980, 33 y.o.   MRN: [id number]  HPI 33 year old African American female, nonsmoker with a history of type 2 diabetes, obesity, and allergic rhinitis is in today for recheck of obesity. She took one month off the phentermine and is currently back on and has been x2 weeks. She was advised at her last appointment that she needed a 2 week break between the medication. Reports being less physically active due to feeling more down and depressed. Requesting a one-week anxiety break from work. Reports having crying spells in the bathroom at work. Denies any thoughts of death or dying.   Review of Systems  Constitutional: Negative.   HENT: Negative.   Respiratory: Negative.   Cardiovascular: Negative.   Gastrointestinal: Negative.   Endocrine: Negative.   Genitourinary: Negative.   Musculoskeletal: Negative.   Skin: Negative.   Allergic/Immunologic: Negative.   Psychiatric/Behavioral: Negative.        Feeling down, blue   Past Medical History  Diagnosis Date  . Depression   . Diabetes mellitus without complication     History   Social History  . Marital Status: Single    Spouse Name: N/A    Number of Children: N/A  . Years of Education: N/A   Occupational History  . Not on file.   Social History Main Topics  . Smoking status: Never Smoker   . Smokeless tobacco: Never Used  . Alcohol Use: Yes     Comment: occ  . Drug Use: No  . Sexual Activity: No   Other Topics Concern  . Not on file   Social History Narrative  . No narrative on file    History reviewed. No pertinent past surgical history.  History reviewed. No pertinent family history.  Allergies  Allergen Reactions  . Shellfish Allergy Itching, Nausea And Vomiting and Swelling    Current Outpatient Prescriptions on File Prior to Visit  Medication Sig Dispense Refill  . clotrimazole-betamethasone (LOTRISONE) cream Apply 1 application topically  2 (two) times daily.  45 g  0  . naproxen sodium (ANAPROX) 220 MG tablet Take 220 mg by mouth daily as needed. For pain       . phentermine 37.5 MG capsule Take 1 capsule (37.5 mg total) by mouth every morning.  30 capsule  1   No current facility-administered medications on file prior to visit.    BP 118/80  Pulse 113  Wt 216 lb (97.977 kg)chart    Objective:   Physical Exam  Constitutional: She is oriented to person, place, and time. She appears well-developed and well-nourished.  HENT:  Right Ear: External ear normal.  Left Ear: External ear normal.  Nose: Nose normal.  Mouth/Throat: Oropharynx is clear and moist.  Neck: Normal range of motion. Neck supple. No thyromegaly present.  Cardiovascular: Normal rate, regular rhythm and normal heart sounds.   Pulmonary/Chest: Effort normal and breath sounds normal.  Abdominal: Soft. Bowel sounds are normal.  Musculoskeletal: Normal range of motion.  Neurological: She is alert and oriented to person, place, and time.  Skin: Skin is warm and dry.  Psychiatric: She has a normal mood and affect.          Assessment & Plan:  Kassy was seen today for follow-up.  Diagnoses and associated orders for this visit:  Type II or unspecified type diabetes mellitus without mention of complication, not stated as uncontrolled - Lipid Panel - CMP  Obesity,  unspecified  Depressive disorder, not elsewhere classified    1 week off work. Consider SSRI if symptoms do not improve. Off phentermine x2 months. Followup in 3 months. Lab sent today. Call if symptoms of depression do not improve in the next 2 weeks. Exercise 4 days per week.

## 2014-05-07 ENCOUNTER — Ambulatory Visit: Payer: 59 | Admitting: Family

## 2014-05-14 ENCOUNTER — Ambulatory Visit (INDEPENDENT_AMBULATORY_CARE_PROVIDER_SITE_OTHER): Payer: Self-pay

## 2014-05-14 ENCOUNTER — Ambulatory Visit (INDEPENDENT_AMBULATORY_CARE_PROVIDER_SITE_OTHER): Payer: Self-pay | Admitting: Family

## 2014-05-14 ENCOUNTER — Encounter: Payer: Self-pay | Admitting: Family

## 2014-05-14 VITALS — BP 132/88 | HR 115 | Wt 246.6 lb

## 2014-05-14 DIAGNOSIS — Z23 Encounter for immunization: Secondary | ICD-10-CM

## 2014-05-14 DIAGNOSIS — Z8679 Personal history of other diseases of the circulatory system: Secondary | ICD-10-CM

## 2014-05-14 DIAGNOSIS — E119 Type 2 diabetes mellitus without complications: Secondary | ICD-10-CM

## 2014-05-14 DIAGNOSIS — E669 Obesity, unspecified: Secondary | ICD-10-CM

## 2014-05-14 NOTE — Patient Instructions (Signed)
Exercise to Lose Weight Exercise and a healthy diet may help you lose weight. Your doctor may suggest specific exercises. EXERCISE IDEAS AND TIPS  Choose low-cost things you enjoy doing, such as walking, bicycling, or exercising to workout videos.  Take stairs instead of the elevator.  Walk during your lunch break.  Park your car further away from work or school.  Go to a gym or an exercise class.  Start with 5 to 10 minutes of exercise each day. Build up to 30 minutes of exercise 4 to 6 days a week.  Wear shoes with good support and comfortable clothes.  Stretch before and after working out.  Work out until you breathe harder and your heart beats faster.  Drink extra water when you exercise.  Do not do so much that you hurt yourself, feel dizzy, or get very short of breath. Exercises that burn about 150 calories:  Running 1  miles in 15 minutes.  Playing volleyball for 45 to 60 minutes.  Washing and waxing a car for 45 to 60 minutes.  Playing touch football for 45 minutes.  Walking 1  miles in 35 minutes.  Pushing a stroller 1  miles in 30 minutes.  Playing basketball for 30 minutes.  Raking leaves for 30 minutes.  Bicycling 5 miles in 30 minutes.  Walking 2 miles in 30 minutes.  Dancing for 30 minutes.  Shoveling snow for 15 minutes.  Swimming laps for 20 minutes.  Walking up stairs for 15 minutes.  Bicycling 4 miles in 15 minutes.  Gardening for 30 to 45 minutes.  Jumping rope for 15 minutes.  Washing windows or floors for 45 to 60 minutes. Document Released: 06/30/2010 Document Revised: 08/20/2011 Document Reviewed: 06/30/2010 Exit[employer] Patient Information 2015 [employer]. This information is not intended to replace advice given to you by your health care provider. Make sure you discuss any questions you have with your health care provider.  

## 2014-05-14 NOTE — Progress Notes (Signed)
   Subjective:    Patient ID: Patricia Bell, female    DOB: 08-Sep-1980, 33 y.o.   MRN: [id number]  HPI  33 year old AAF, nonsmoker, is in today for a recheck of obesity, HTN, and Type 2 DM-controlled off medications after weight reduction. She has gained approx 28 lbs since her last OV. Recently moved in with her boyfriend and reports a lack of motivation to exercise. She recently started a new job that she enjoys. Does not routinely check glucose any more.    Review of Systems  Constitutional: Positive for unexpected weight change.       Weight gain  HENT: Negative.   Respiratory: Negative.   Cardiovascular: Negative.   Gastrointestinal: Negative.   Endocrine: Negative.   Genitourinary: Negative.   Musculoskeletal: Negative.   Neurological: Negative.   Hematological: Negative.   Psychiatric/Behavioral: Negative.    Past Medical History  Diagnosis Date  . Depression   . Diabetes mellitus without complication     History   Social History  . Marital Status: Single    Spouse Name: N/A    Number of Children: N/A  . Years of Education: N/A   Occupational History  . Not on file.   Social History Main Topics  . Smoking status: Never Smoker   . Smokeless tobacco: Never Used  . Alcohol Use: Yes     Comment: occ  . Drug Use: No  . Sexual Activity: No   Other Topics Concern  . Not on file   Social History Narrative    No past surgical history on file.  No family history on file.  Allergies  Allergen Reactions  . Shellfish Allergy Itching, Nausea And Vomiting and Swelling    No current outpatient prescriptions on file prior to visit.   No current facility-administered medications on file prior to visit.    BP 132/88 mmHg  Pulse 115  Wt 246 lb 9.6 oz (111.857 kg)chart    Objective:   Physical Exam  Constitutional: She is oriented to person, place, and time. She appears well-developed and well-nourished.  Up 28 lbs  HENT:  Right Ear: External ear normal.   Left Ear: External ear normal.  Nose: Nose normal.  Mouth/Throat: Oropharynx is clear and moist. No oropharyngeal exudate.  Neck: Normal range of motion. Neck supple. No thyromegaly present.  Cardiovascular: Normal rate, regular rhythm and normal heart sounds.   Pulmonary/Chest: Effort normal and breath sounds normal.  Abdominal: Soft. Bowel sounds are normal.  Musculoskeletal: Normal range of motion.  Neurological: She is alert and oriented to person, place, and time.  Skin: Skin is warm and dry.  Psychiatric: She has a normal mood and affect.          Assessment & Plan:  Patricia Bell was seen today for follow-up.  Diagnoses and associated orders for this visit:  Type 2 diabetes mellitus without complication - Hemoglobin F0Y - Basic Metabolic Panel - Hepatic Function Panel  Obesity - Hemoglobin D7A - Basic Metabolic Panel - Hepatic Function Panel  History of hypertension   Advised patient that she must resume exercising and decrease weight. Recheck in 6 weeks to be sure her weight is not continuing to increase.

## 2014-05-14 NOTE — Progress Notes (Signed)
Pre visit review using our clinic review tool, if applicable. No additional management support is needed unless otherwise documented below in the visit note. 

## 2014-05-16 LAB — HEPATIC FUNCTION PANEL
ALK PHOS: 68 U/L (ref 39–117)
ALT: 20 U/L (ref 0–35)
AST: 21 U/L (ref 0–37)
Albumin: 3.7 g/dL (ref 3.5–5.2)
BILIRUBIN TOTAL: 0.4 mg/dL (ref 0.2–1.2)
Bilirubin, Direct: 0.1 mg/dL (ref 0.0–0.3)
Total Protein: 6.8 g/dL (ref 6.0–8.3)

## 2014-05-16 LAB — BASIC METABOLIC PANEL
BUN: 17 mg/dL (ref 6–23)
CO2: 26 mEq/L (ref 19–32)
Calcium: 8.9 mg/dL (ref 8.4–10.5)
Chloride: 106 mEq/L (ref 96–112)
Creatinine, Ser: 0.9 mg/dL (ref 0.4–1.2)
GFR: 90.98 mL/min (ref >60.00)
GLUCOSE: 106 mg/dL — AB (ref 70–99)
Potassium: 4.8 mEq/L (ref 3.5–5.1)
SODIUM: 140 meq/L (ref 135–145)

## 2014-05-16 LAB — HEMOGLOBIN A1C: Hgb A1c MFr Bld: 6.2 % (ref 4.6–6.5)

## 2014-06-25 ENCOUNTER — Ambulatory Visit (INDEPENDENT_AMBULATORY_CARE_PROVIDER_SITE_OTHER): Payer: Self-pay | Admitting: Family

## 2014-06-25 ENCOUNTER — Encounter: Payer: Self-pay | Admitting: Family

## 2014-06-25 VITALS — BP 134/88 | HR 101 | Temp 99.0°F | Wt 258.0 lb

## 2014-06-25 DIAGNOSIS — R739 Hyperglycemia, unspecified: Secondary | ICD-10-CM

## 2014-06-25 DIAGNOSIS — R635 Abnormal weight gain: Secondary | ICD-10-CM

## 2014-06-25 MED ORDER — PHENTERMINE HCL 37.5 MG PO CAPS: 37.5000 mg | ORAL_CAPSULE | ORAL | Status: DC

## 2014-06-25 NOTE — Progress Notes (Signed)
   Subjective:    Patient ID: Patricia Bell, female    DOB: Oct 18, 1980, 34 y.o.   MRN: [id number]  HPI 34 year old African-American female, with a past medical history of type 2 diabetes currently off of medications is in today for recheck of obesity. At her last office visit Patricia Bell had gain over 30 pounds. Today, Patricia Bell is up an additional 12 pounds from total weight gain of 42 pounds over the last 6 months. Reports stress eating. Exercises 30 minutes approximately 3 times per week. Reports a nightly glass of vodka or wine. Denies any depression.   Review of Systems  Constitutional: Positive for unexpected weight change. Negative for chills.  HENT: Negative.   Respiratory: Negative.   Cardiovascular: Negative.   Gastrointestinal: Negative.   Genitourinary: Negative.   Musculoskeletal: Negative.   Skin: Negative.   Allergic/Immunologic: Negative.   Neurological: Negative.   Hematological: Negative.   Psychiatric/Behavioral: Negative.   All other systems reviewed and are negative.  Past Medical History  Diagnosis Date  . Depression   . Diabetes mellitus without complication     History   Social History  . Marital Status: Single    Spouse Name: N/A    Number of Children: N/A  . Years of Education: N/A   Occupational History  . Not on file.   Social History Main Topics  . Smoking status: Never Smoker   . Smokeless tobacco: Never Used  . Alcohol Use: Yes     Comment: occ  . Drug Use: No  . Sexual Activity: No   Other Topics Concern  . Not on file   Social History Narrative    History reviewed. No pertinent past surgical history.  History reviewed. No pertinent family history.  Allergies  Allergen Reactions  . Shellfish Allergy Itching, Nausea And Vomiting and Swelling    No current outpatient prescriptions on file prior to visit.   No current facility-administered medications on file prior to visit.    BP 134/88 mmHg  Pulse 101  Temp(Src) 99 F (37.2 C)  (Oral)  Wt 258 lb (117.028 kg)chart    Objective:   Physical Exam  Constitutional: Patricia Bell is oriented to person, place, and time. Patricia Bell appears well-developed and well-nourished.  HENT:  Right Ear: External ear normal.  Left Ear: External ear normal.  Nose: Nose normal.  Mouth/Throat: Oropharynx is clear and moist.  Neck: Normal range of motion. Neck supple. No thyromegaly present.  Cardiovascular: Normal rate, regular rhythm and normal heart sounds.   Pulmonary/Chest: Effort normal and breath sounds normal.  Abdominal: Soft. Bowel sounds are normal.  Musculoskeletal: Normal range of motion.  Neurological: Patricia Bell is alert and oriented to person, place, and time. Patricia Bell has normal reflexes.  Skin: Skin is warm and dry.  Psychiatric: Patricia Bell has a normal mood and affect.          Assessment & Plan:  Patricia Bell was seen today for follow-up.  Diagnoses and associated orders for this visit:  Weight gain  Morbid obesity  Hyperglycemia  Other Orders - phentermine 37.5 MG capsule; Take 1 capsule (37.5 mg total) by mouth every morning.     Increased cardiovascular exercise to 45 minutes 3-4 days a week minimum. Decrease alcohol consumption. We'll trial phentermine 1 month. Patricia Bell has significant weight reduction off this medication in the past. Has encouraged her to consider bariatric surgery. Declines at the present time.

## 2014-06-25 NOTE — Patient Instructions (Signed)
Exercise to Lose Weight Exercise and a healthy diet may help you lose weight. Your doctor may suggest specific exercises. EXERCISE IDEAS AND TIPS  Choose low-cost things you enjoy doing, such as walking, bicycling, or exercising to workout videos.  Take stairs instead of the elevator.  Walk during your lunch break.  Park your car further away from work or school.  Go to a gym or an exercise class.  Start with 5 to 10 minutes of exercise each day. Build up to 30 minutes of exercise 4 to 6 days a week.  Wear shoes with good support and comfortable clothes.  Stretch before and after working out.  Work out until you breathe harder and your heart beats faster.  Drink extra water when you exercise.  Do not do so much that you hurt yourself, feel dizzy, or get very short of breath. Exercises that burn about 150 calories:  Running 1  miles in 15 minutes.  Playing volleyball for 45 to 60 minutes.  Washing and waxing a car for 45 to 60 minutes.  Playing touch football for 45 minutes.  Walking 1  miles in 35 minutes.  Pushing a stroller 1  miles in 30 minutes.  Playing basketball for 30 minutes.  Raking leaves for 30 minutes.  Bicycling 5 miles in 30 minutes.  Walking 2 miles in 30 minutes.  Dancing for 30 minutes.  Shoveling snow for 15 minutes.  Swimming laps for 20 minutes.  Walking up stairs for 15 minutes.  Bicycling 4 miles in 15 minutes.  Gardening for 30 to 45 minutes.  Jumping rope for 15 minutes.  Washing windows or floors for 45 to 60 minutes. Document Released: 06/30/2010 Document Revised: 08/20/2011 Document Reviewed: 06/30/2010 Exit[employer] Patient Information 2015 [employer]. This information is not intended to replace advice given to you by your health care provider. Make sure you discuss any questions you have with your health care provider.  

## 2014-06-25 NOTE — Progress Notes (Signed)
Pre visit review using our clinic review tool, if applicable. No additional management support is needed unless otherwise documented below in the visit note. 

## 2014-07-23 ENCOUNTER — Other Ambulatory Visit: Payer: Self-pay

## 2014-07-26 ENCOUNTER — Other Ambulatory Visit: Payer: Self-pay

## 2014-08-03 ENCOUNTER — Other Ambulatory Visit (INDEPENDENT_AMBULATORY_CARE_PROVIDER_SITE_OTHER): Payer: Self-pay

## 2014-08-03 DIAGNOSIS — E1310 Other specified diabetes mellitus with ketoacidosis without coma: Secondary | ICD-10-CM

## 2014-08-03 DIAGNOSIS — E1110 Type 2 diabetes mellitus with ketoacidosis without coma: Secondary | ICD-10-CM

## 2014-08-03 LAB — BASIC METABOLIC PANEL
BUN: 15 mg/dL (ref 6–23)
CO2: 26 meq/L (ref 19–32)
Calcium: 8.4 mg/dL (ref 8.4–10.5)
Chloride: 106 mEq/L (ref 96–112)
Creatinine, Ser: 0.93 mg/dL (ref 0.40–1.20)
GFR: 89.73 mL/min (ref >60.00)
GLUCOSE: 122 mg/dL — AB (ref 70–99)
POTASSIUM: 4.2 meq/L (ref 3.5–5.1)
Sodium: 136 mEq/L (ref 135–145)

## 2014-08-06 ENCOUNTER — Telehealth: Payer: Self-pay | Admitting: Family

## 2014-08-10 MED ORDER — PHENTERMINE HCL 37.5 MG PO CAPS: 37.5000 mg | ORAL_CAPSULE | ORAL | Status: DC

## 2014-08-10 NOTE — Telephone Encounter (Signed)
Correction on that note: pt weighed 254lb on 08/03/14

## 2014-08-10 NOTE — Telephone Encounter (Signed)
Done but we need a weight

## 2014-12-10 ENCOUNTER — Ambulatory Visit: Payer: Self-pay | Admitting: Family

## 2015-01-07 ENCOUNTER — Ambulatory Visit (INDEPENDENT_AMBULATORY_CARE_PROVIDER_SITE_OTHER): Payer: Self-pay | Admitting: Family

## 2015-01-07 ENCOUNTER — Encounter: Payer: Self-pay | Admitting: Family

## 2015-01-07 VITALS — BP 138/80 | HR 102 | Temp 98.9°F | Ht 64.0 in | Wt 274.4 lb

## 2015-01-07 DIAGNOSIS — M7989 Other specified soft tissue disorders: Secondary | ICD-10-CM

## 2015-01-07 DIAGNOSIS — E669 Obesity, unspecified: Secondary | ICD-10-CM

## 2015-01-07 DIAGNOSIS — R739 Hyperglycemia, unspecified: Secondary | ICD-10-CM

## 2015-01-07 LAB — POCT URINALYSIS DIPSTICK
BILIRUBIN UA: NEGATIVE
Ketones, UA: NEGATIVE
Leukocytes, UA: NEGATIVE
NITRITE UA: NEGATIVE
PH UA: 6
RBC UA: NEGATIVE
Spec Grav, UA: 1.025
Urobilinogen, UA: 0.2

## 2015-01-07 LAB — HEMOGLOBIN A1C: Hgb A1c MFr Bld: 6.1 % (ref 4.6–6.5)

## 2015-01-07 LAB — BASIC METABOLIC PANEL
BUN: 15 mg/dL (ref 6–23)
CO2: 27 mEq/L (ref 19–32)
Calcium: 8.4 mg/dL (ref 8.4–10.5)
Chloride: 104 mEq/L (ref 96–112)
Creatinine, Ser: 0.86 mg/dL (ref 0.40–1.20)
GFR: 97.94 mL/min (ref >60.00)
GLUCOSE: 228 mg/dL — AB (ref 70–99)
POTASSIUM: 4.1 meq/L (ref 3.5–5.1)
Sodium: 137 mEq/L (ref 135–145)

## 2015-01-07 LAB — POCT URINE PREGNANCY: PREG TEST UR: NEGATIVE

## 2015-01-07 NOTE — Patient Instructions (Signed)
Exercise 1 hour on Saturdays and Sundays   Exercise to Lose Weight Exercise and a healthy diet may help you lose weight. Your doctor may suggest specific exercises. EXERCISE IDEAS AND TIPS  Choose low-cost things you enjoy doing, such as walking, bicycling, or exercising to workout videos.  Take stairs instead of the elevator.  Walk during your lunch break.  Park your car further away from work or school.  Go to a gym or an exercise class.  Start with 5 to 10 minutes of exercise each day. Build up to 30 minutes of exercise 4 to 6 days a week.  Wear shoes with good support and comfortable clothes.  Stretch before and after working out.  Work out until you breathe harder and your heart beats faster.  Drink extra water when you exercise.  Do not do so much that you hurt yourself, feel dizzy, or get very short of breath. Exercises that burn about 150 calories:  Running 1  miles in 15 minutes.  Playing volleyball for 45 to 60 minutes.  Washing and waxing a car for 45 to 60 minutes.  Playing touch football for 45 minutes.  Walking 1  miles in 35 minutes.  Pushing a stroller 1  miles in 30 minutes.  Playing basketball for 30 minutes.  Raking leaves for 30 minutes.  Bicycling 5 miles in 30 minutes.  Walking 2 miles in 30 minutes.  Dancing for 30 minutes.  Shoveling snow for 15 minutes.  Swimming laps for 20 minutes.  Walking up stairs for 15 minutes.  Bicycling 4 miles in 15 minutes.  Gardening for 30 to 45 minutes.  Jumping rope for 15 minutes.  Washing windows or floors for 45 to 60 minutes. Document Released: 06/30/2010 Document Revised: 08/20/2011 Document Reviewed: 06/30/2010 [facility] Patient Information 2015 [place], [place]. This information is not intended to replace advice given to you by your health care provider. Make sure you discuss any questions you have with your health care provider.

## 2015-01-07 NOTE — Progress Notes (Signed)
Pre visit review using our clinic review tool, if applicable. No additional management support is needed unless otherwise documented below in the visit note. 

## 2015-01-07 NOTE — Progress Notes (Signed)
Subjective:    Patient ID: Patricia Bell, female    DOB: Mar 25, 1981, 34 y.o.   MRN: [id number]  HPI 34 year old female, nonsmoker, with a history of hyperglycemia, obesity, and depression is in today for a recheck. She has gained about 16 pounds since her last OV. Is not exercising. Reports bilateral leg swelling over the last 2 months. Is less active. Commutes 1 hour a day for work and reports being too tired to exercise anymore.    Review of Systems  Constitutional: Positive for activity change and unexpected weight change. Negative for appetite change.  HENT: Negative.   Respiratory: Negative.   Cardiovascular: Positive for leg swelling. Negative for chest pain and palpitations.  Gastrointestinal: Negative.   Endocrine: Negative.   Genitourinary: Negative.   Musculoskeletal: Negative.   Skin: Negative.   Allergic/Immunologic: Negative.   Neurological: Negative.   Hematological: Negative.   Psychiatric/Behavioral: Negative.    Past Medical History  Diagnosis Date  . Depression   . Diabetes mellitus without complication     History   Social History  . Marital Status: Single    Spouse Name: N/A  . Number of Children: N/A  . Years of Education: N/A   Occupational History  . Not on file.   Social History Main Topics  . Smoking status: Never Smoker   . Smokeless tobacco: Never Used  . Alcohol Use: Yes     Comment: occ  . Drug Use: No  . Sexual Activity: No   Other Topics Concern  . Not on file   Social History Narrative    No past surgical history on file.  No family history on file.  Allergies  Allergen Reactions  . Shellfish Allergy Itching, Nausea And Vomiting and Swelling    Current Outpatient Prescriptions on File Prior to Visit  Medication Sig Dispense Refill  . phentermine 37.5 MG capsule Take 1 capsule (37.5 mg total) by mouth every morning. (Patient not taking: Reported on 01/07/2015) 30 capsule 0   No current facility-administered  medications on file prior to visit.    BP 138/80 mmHg  Pulse 102  Temp(Src) 98.9 F (37.2 C) (Oral)  Ht 5\' 4"  (1.626 m)  Wt 274 lb 6 oz (124.456 kg)  BMI 47.07 kg/m2  SpO2 96%  LMP 07/15/2016chart    Objective:   Physical Exam  Constitutional: She is oriented to person, place, and time. She appears well-nourished.  HENT:  Right Ear: External ear normal.  Left Ear: External ear normal.  Nose: Nose normal.  Mouth/Throat: Oropharynx is clear and moist.  Neck: Normal range of motion. Neck supple.  Cardiovascular: Normal rate, regular rhythm and normal heart sounds.   Pulmonary/Chest: Effort normal and breath sounds normal.  Abdominal: Soft. Bowel sounds are normal.  Musculoskeletal: Normal range of motion. She exhibits edema. She exhibits no tenderness.  Neurological: She is alert and oriented to person, place, and time.  Skin: Skin is warm and dry.  Psychiatric: She has a normal mood and affect.          Assessment & Plan:  Diagnoses and all orders for this visit:  Obesity Orders: -     Basic Metabolic Panel -     POCT urine pregnancy -     POC Urinalysis Dipstick  Bilateral swelling of feet Orders: -     Basic Metabolic Panel -     POCT urine pregnancy -     POC Urinalysis Dipstick  Hyperglycemia Orders: -  Hemoglobin A1c -     Basic Metabolic Panel -     POCT urine pregnancy -     POC Urinalysis Dipstick   Strongly suggest increase in physical activity. Suggest getting a fit bit and striving for 10,000 steps per day at a minimum. Call the office with any questions or concerns. Recheck in 6 months and sooner as needed.

## 2015-02-03 ENCOUNTER — Encounter: Payer: Self-pay | Admitting: Family

## 2015-03-03 ENCOUNTER — Ambulatory Visit: Payer: Self-pay | Admitting: Family

## 2015-03-09 ENCOUNTER — Encounter: Payer: Self-pay | Admitting: Family

## 2015-03-09 ENCOUNTER — Ambulatory Visit (INDEPENDENT_AMBULATORY_CARE_PROVIDER_SITE_OTHER): Payer: Self-pay | Admitting: Family

## 2015-03-09 VITALS — BP 138/84 | HR 108 | Temp 98.8°F | Resp 20 | Ht 64.0 in | Wt 284.0 lb

## 2015-03-09 DIAGNOSIS — R300 Dysuria: Secondary | ICD-10-CM

## 2015-03-09 DIAGNOSIS — R609 Edema, unspecified: Secondary | ICD-10-CM

## 2015-03-09 LAB — BASIC METABOLIC PANEL
BUN: 18 mg/dL (ref 6–23)
CHLORIDE: 105 meq/L (ref 96–112)
CO2: 27 mEq/L (ref 19–32)
Calcium: 8.9 mg/dL (ref 8.4–10.5)
Creatinine, Ser: 1.02 mg/dL (ref 0.40–1.20)
GFR: 80.36 mL/min (ref >60.00)
Glucose, Bld: 263 mg/dL — ABNORMAL HIGH (ref 70–99)
POTASSIUM: 4 meq/L (ref 3.5–5.1)
Sodium: 138 mEq/L (ref 135–145)

## 2015-03-09 LAB — POCT URINALYSIS DIPSTICK
Bilirubin, UA: NEGATIVE
Nitrite, UA: NEGATIVE
PROTEIN UA: NEGATIVE
Urobilinogen, UA: 0.2
pH, UA: 5.5

## 2015-03-09 MED ORDER — FUROSEMIDE 20 MG PO TABS: 20.0000 mg | ORAL_TABLET | Freq: Every day | ORAL | Status: DC

## 2015-03-09 NOTE — Progress Notes (Signed)
   Subjective:    Patient ID: Patricia Bell, female    DOB: 1980/10/04, 34 y.o.   MRN: [id number]  HPI 34 year old African-American female, nonsmoker with a history of obesity, type 2 diabetes and allergic rhinitis is in today with complaints of peripheral edema to the left foot ongoing 2 weeks. Reports she's been doing more walking recently and is not sure if she may be sprained her ankle. Weight is up 10 pounds from last office visit. Continues to commute from [place] to [place] for work and often snacks.   Review of Systems  Constitutional: Negative.   HENT: Negative.   Respiratory: Negative.   Cardiovascular: Positive for leg swelling. Negative for chest pain and palpitations.  Gastrointestinal: Negative.   Endocrine: Negative.   Genitourinary: Negative.   Musculoskeletal: Negative.   Skin: Negative.   Allergic/Immunologic: Negative.   Neurological: Negative.   Hematological: Negative.   Psychiatric/Behavioral: Negative.    Past Medical History  Diagnosis Date  . Depression   . Diabetes mellitus without complication     Social History   Social History  . Marital Status: Single    Spouse Name: N/A  . Number of Children: N/A  . Years of Education: N/A   Occupational History  . Not on file.   Social History Main Topics  . Smoking status: Never Smoker   . Smokeless tobacco: Never Used  . Alcohol Use: Yes     Comment: occ  . Drug Use: No  . Sexual Activity: No   Other Topics Concern  . Not on file   Social History Narrative    No past surgical history on file.  No family history on file.  Allergies  Allergen Reactions  . Shellfish Allergy Itching, Nausea And Vomiting and Swelling    Current Outpatient Prescriptions on File Prior to Visit  Medication Sig Dispense Refill  . Naproxen Sodium 220 MG CAPS Take 440 mg by mouth daily. OTC     No current facility-administered medications on file prior to visit.    BP 138/84 mmHg  Pulse 108   Temp(Src) 98.8 F (37.1 C) (Oral)  Resp 20  Ht 5\' 4"  (1.626 m)  Wt 284 lb (128.822 kg)  BMI 48.72 kg/m2  SpO2 98%  LMP 09/11/2016chart    Objective:   Physical Exam  Constitutional: She is oriented to person, place, and time. She appears well-developed and well-nourished.  Weight up 10 pounds  Neck: Normal range of motion. Neck supple.  Cardiovascular: Normal rate, regular rhythm and normal heart sounds.   Pulmonary/Chest: Effort normal and breath sounds normal.  Abdominal: Soft. Bowel sounds are normal.  Musculoskeletal: Normal range of motion. She exhibits edema. She exhibits no tenderness.  2+ pitting edema noted to the left foot  Neurological: She is alert and oriented to person, place, and time.  Skin: Skin is warm and dry.  Psychiatric: She has a normal mood and affect.          Assessment & Plan:  Patricia Bell was seen today for edema.  Diagnoses and all orders for this visit:  Edema, peripheral -     Basic Metabolic Panel -     POC Urinalysis Dipstick  Morbid obesity  Other orders -     furosemide (LASIX) 20 MG tablet; Take 1 tablet (20 mg total) by mouth daily.    call the office with any questions or concern. Recheck in one month and sooner as needed. Encouraged healthy diet exercise, and weight reduction.

## 2015-03-09 NOTE — Addendum Note (Signed)
Addended by: Elmer Picker on: 03/09/2015 03:08 PM   Modules accepted: Orders

## 2015-03-09 NOTE — Patient Instructions (Signed)
Peripheral Edema °You have swelling in your legs (peripheral edema). This swelling is due to excess accumulation of salt and water in your body. Edema may be a sign of heart, kidney or liver disease, or a side effect of a medication. It may also be due to problems in the leg veins. Elevating your legs and using special support stockings may be very helpful, if the cause of the swelling is due to poor venous circulation. Avoid long periods of standing, whatever the cause. °Treatment of edema depends on identifying the cause. Chips, pretzels, pickles and other salty foods should be avoided. Restricting salt in your diet is almost always needed. Water pills (diuretics) are often used to remove the excess salt and water from your body via urine. These medicines prevent the kidney from reabsorbing sodium. This increases urine flow. °Diuretic treatment may also result in lowering of potassium levels in your body. Potassium supplements may be needed if you have to use diuretics daily. Daily weights can help you keep track of your progress in clearing your edema. You should call your caregiver for follow up care as recommended. °SEEK IMMEDIATE MEDICAL CARE IF:  °· You have increased swelling, pain, redness, or heat in your legs. °· You develop shortness of breath, especially when lying down. °· You develop chest or abdominal pain, weakness, or fainting. °· You have a fever. °Document Released: 07/05/2004 Document Revised: 08/20/2011 Document Reviewed: 06/15/2009 °[employer]® Patient Information ©2015 [employer]. This information is not intended to replace advice given to you by your health care provider. Make sure you discuss any questions you have with your health care provider. ° °

## 2015-03-09 NOTE — Progress Notes (Signed)
Pre visit review using our clinic review tool, if applicable. No additional management support is needed unless otherwise documented below in the visit note. 

## 2015-03-10 LAB — URINE CULTURE
Colony Count: NO GROWTH
Organism ID, Bacteria: NO GROWTH

## 2015-03-11 ENCOUNTER — Other Ambulatory Visit: Payer: Self-pay | Admitting: Family

## 2015-03-11 MED ORDER — METFORMIN HCL 500 MG PO TABS: 500.0000 mg | ORAL_TABLET | Freq: Two times a day (BID) | ORAL | Status: DC

## 2015-06-10 ENCOUNTER — Encounter: Payer: Self-pay | Admitting: Family

## 2015-06-14 ENCOUNTER — Other Ambulatory Visit: Payer: Self-pay | Admitting: Family

## 2015-06-14 MED ORDER — METFORMIN HCL 500 MG PO TABS: 1000.0000 mg | ORAL_TABLET | Freq: Two times a day (BID) | ORAL | Status: DC

## 2015-06-14 NOTE — Telephone Encounter (Signed)
Since Padonda is not here can you please advise me. Should i schedule an appt for her to see one of the doctors?

## 2015-06-14 NOTE — Telephone Encounter (Signed)
As per Dr Elease Hashimoto pt should start taking 1000mg  of metformin twice a day. Also pt should call and make an appt to see one of our Drs here.

## 2015-11-30 ENCOUNTER — Ambulatory Visit: Payer: Self-pay | Admitting: Family Medicine

## 2015-12-08 ENCOUNTER — Ambulatory Visit: Payer: Self-pay | Admitting: Family Medicine

## 2016-01-03 ENCOUNTER — Ambulatory Visit: Payer: Self-pay | Admitting: Family Medicine

## 2016-01-23 ENCOUNTER — Encounter: Payer: Self-pay | Admitting: Family Medicine

## 2016-01-23 ENCOUNTER — Ambulatory Visit (INDEPENDENT_AMBULATORY_CARE_PROVIDER_SITE_OTHER): Payer: BLUE CROSS/BLUE SHIELD | Admitting: Family Medicine

## 2016-01-23 ENCOUNTER — Other Ambulatory Visit: Payer: BLUE CROSS/BLUE SHIELD

## 2016-01-23 VITALS — BP 140/80 | HR 89 | Resp 12 | Ht 64.0 in | Wt 246.0 lb

## 2016-01-23 DIAGNOSIS — K529 Noninfective gastroenteritis and colitis, unspecified: Secondary | ICD-10-CM

## 2016-01-23 DIAGNOSIS — Z6841 Body Mass Index (BMI) 40.0 and over, adult: Secondary | ICD-10-CM

## 2016-01-23 DIAGNOSIS — N938 Other specified abnormal uterine and vaginal bleeding: Secondary | ICD-10-CM | POA: Diagnosis not present

## 2016-01-23 DIAGNOSIS — R1032 Left lower quadrant pain: Secondary | ICD-10-CM | POA: Diagnosis not present

## 2016-01-23 DIAGNOSIS — E118 Type 2 diabetes mellitus with unspecified complications: Secondary | ICD-10-CM | POA: Diagnosis not present

## 2016-01-23 DIAGNOSIS — Q738 Other reduction defects of unspecified limb(s): Secondary | ICD-10-CM

## 2016-01-23 LAB — MICROALBUMIN / CREATININE URINE RATIO
Creatinine,U: 103.1 mg/dL
MICROALB UR: 1.8 mg/dL (ref 0.0–1.9)
Microalb Creat Ratio: 1.7 mg/g (ref 0.0–30.0)

## 2016-01-23 LAB — CBC
HCT: 41.5 % (ref 36.0–46.0)
Hemoglobin: 14.1 g/dL (ref 12.0–15.0)
MCHC: 34 g/dL (ref 30.0–36.0)
MCV: 73.1 fl — ABNORMAL LOW (ref 78.0–100.0)
PLATELETS: 281 10*3/uL (ref 150.0–400.0)
RBC: 5.67 Mil/uL — ABNORMAL HIGH (ref 3.87–5.11)
RDW: 14.4 % (ref 11.5–15.5)
WBC: 10.8 10*3/uL — ABNORMAL HIGH (ref 4.0–10.5)

## 2016-01-23 LAB — COMPREHENSIVE METABOLIC PANEL
ALK PHOS: 70 U/L (ref 39–117)
ALT: 13 U/L (ref 0–35)
AST: 9 U/L (ref 0–37)
Albumin: 4 g/dL (ref 3.5–5.2)
BILIRUBIN TOTAL: 0.3 mg/dL (ref 0.2–1.2)
BUN: 18 mg/dL (ref 6–23)
CO2: 26 meq/L (ref 19–32)
CREATININE: 0.87 mg/dL (ref 0.40–1.20)
Calcium: 9.9 mg/dL (ref 8.4–10.5)
Chloride: 102 mEq/L (ref 96–112)
GFR: 96.03 mL/min (ref >60.00)
Glucose, Bld: 287 mg/dL — ABNORMAL HIGH (ref 70–99)
Potassium: 4.2 mEq/L (ref 3.5–5.1)
Sodium: 137 mEq/L (ref 135–145)
TOTAL PROTEIN: 6.7 g/dL (ref 6.0–8.3)

## 2016-01-23 LAB — TSH: TSH: 2.14 u[IU]/mL (ref 0.35–4.50)

## 2016-01-23 LAB — POCT URINE PREGNANCY: Preg Test, Ur: NEGATIVE

## 2016-01-23 LAB — LIPASE: LIPASE: 5 U/L — AB (ref 11.0–59.0)

## 2016-01-23 NOTE — Patient Instructions (Addendum)
A few things to remember from today's visit:   Type 2 diabetes mellitus with complication, without long-term current use of insulin ([place]) - Plan: Hemoglobin A1c, CMP, Microalbumin/Creatinine Ratio, Urine  Dysfunctional uterine bleeding - Plan: CBC, TSH, POCT urine pregnancy, US OB Transvaginal  Abdominal pain, LLQ - Plan: CMP, CBC, Lipase, C difficile Toxins A+B W/Rflx, US OB Transvaginal  Chronic diarrhea - Plan: Lipase, C difficile Toxins A+B W/Rflx  BMI 40.0-44.9, adult ([facility]) - Plan: TSH  What are some tips for weight loss? People become overweight for many reasons. Weight issues can run in families. They can be caused by unhealthy behaviors and a person's environment. Certain health problems and medicines can also lead to weight gain. There are some simple things you can do to reach and maintain a healthy weight:  Eat small more frequent healthy meals instead 3 bid meals. Also Weight Watchers is a good option. Avoid sweet drinks. These include regular soft drinks, fruit juices, fruit drinks, energy drinks, sweetened iced tea, and flavored milk. Avoid fast foods. Fast foods such as french fries, hamburgers, chicken nuggets, and pizza are high in calories and can cause weight gain. Eat a healthy breakfast. People who skip breakfast tend to weigh more. Don't watch more than two hours of television per day. Chew sugar-free gum between meals to cut down on snacking. Avoid grocery shopping when you're hungry. Pack a healthy lunch instead of eating out to control what and how much you eat. Eat a lot of fruits and vegetables. Aim for about 2 cups of fruit and 2 to 3 cups of vegetables per day. Aim for 150 minutes per week of moderate-intensity exercise (such as brisk walking), or 75 minutes per week of vigorous exercise (such as jogging or running). OR 15-30 min of daily brisk walking. Be more active. Small changes in physical activity can easily be added to your daily routine. For example,  take the stairs instead of the elevator. Take a walk with your family. A daily walk is a great way to get exercise and to catch up on the day's events.  HgA1C goal < 7.0. Avoid sugar added food:regular soft drinks, energy drinks, and sports drinks. candy. cakes. cookies. pies and cobblers. sweet rolls, pastries, and donuts. fruit drinks, such as fruitades and fruit punch. dairy desserts, such as ice cream  Mediterranean diet has showed benefits for sugar control.  How much and what type of carbohydrate foods are important for managing diabetes. The balance between how much insulin is in your body and the carbohydrate you eat makes a difference in your blood glucose levels.  Fasting blood sugar ideally 130 or less, 2 hours after meals less than 180.   Regular exercise also will help with controlling disease, daily brisk walking as tolerated for 15-30 min definitively will help.   Avoid skipping meals, blood sugar might drop and cause serious problems. Remember checking feet periodically, good dental hygiene, and annual eye exam.      I will bring you back in 3-4 week for pelvic exam and follow up, after labs and ultrasound are done.

## 2016-01-23 NOTE — Progress Notes (Signed)
HPI:   [name] is a 35 y.o. female, who is here today to establish care with me.  Former PCP: Ms Patricia Bell. Last preventive routine visit: 2015.  Concerns today: abdominal "discomfort", vaginal bleeding.  LLQ abdominal pain, intermittently, "tightness" sensation, which she is having for "a few months" No exacerbating factors identified, alleviated by changing positions. 3/10 in intensity, no radiated. No associated urinary symptoms, nausea, or vomiting. No changes in bowel habits, she is reporting diarrhea for months now, 3-4 times per day, no blood in the stool; she denies Hx of IBS. + Bloating sensation. No recent antibiotic treatment. No recent travel or sick contact.   04/16/11 abdominal U/S due to abdominal pain:  1.  Scattered areas of focal hepatic steatosis. 2.  Normal-appearing gallbladder by ultrasound.  No biliary ductal dilation. 3.  Non-visualization of the pancreatic head, distal body, and tail.  Possible pancreatic ductal dilation.  -Intermittent vaginal spotting since May 2017, light and usually lasts less than 24 hours. LMP 12/30/2015. She denies sexual activity. No vaginal discharge. No history of abnormal Pap smears. Last Pap smear 2015.   Hx of DM II, she stopped Metformin about a year ago because GI side effects. She denies any numbness, burning, or tingling on feet.  BS's 200's.   Lab Results  Component Value Date   HGBA1C 6.1 01/07/2015     Hx of HTN, she is on non pharmacologic treatment. Denies severe/frequent headache, visual changes, chest pain, dyspnea, palpitation, claudication, focal weakness, or edema.    Review of Systems  Constitutional: Negative for activity change, appetite change, fatigue, fever and unexpected weight change.  HENT: Negative for mouth sores, nosebleeds, sore throat and trouble swallowing.   Eyes: Negative for redness and visual disturbance.  Respiratory: Negative for cough, shortness of breath  and wheezing.   Cardiovascular: Negative for chest pain, palpitations and leg swelling.  Gastrointestinal: Positive for abdominal distention, abdominal pain and diarrhea. Negative for blood in stool, nausea and vomiting.  Endocrine: Negative for cold intolerance, heat intolerance, polydipsia, polyphagia and polyuria.  Genitourinary: Positive for menstrual problem and vaginal bleeding. Negative for decreased urine volume, difficulty urinating, dysuria, hematuria, vaginal discharge and vaginal pain.  Musculoskeletal: Negative for back pain and myalgias.  Skin: Negative for pallor and rash.  Neurological: Negative for seizures, syncope, weakness, numbness and headaches.  Hematological: Negative for adenopathy. Does not bruise/bleed easily.  Psychiatric/Behavioral: Negative for confusion. The patient is nervous/anxious.       No current outpatient prescriptions on file prior to visit.   No current facility-administered medications on file prior to visit.      Past Medical History:  Diagnosis Date  . Anxiety   . Depression   . Diabetes mellitus without complication ([place])   . Hypertension    Allergies  Allergen Reactions  . Shellfish Allergy Itching, Nausea And Vomiting and Swelling    Family History  Problem Relation Age of Onset  . Diabetes Mother   . Hypertension Mother   . Hypertension Father   . Diabetes Sister   . Diabetes Maternal Aunt   . Diabetes Maternal Uncle   . Heart disease Maternal Grandmother   . Diabetes Maternal Grandmother   . Stroke Maternal Grandmother     Social History   Social History  . Marital status: Single    Spouse name: N/A  . Number of children: N/A  . Years of education: N/A   Social History Main Topics  . Smoking status: Never  Smoker  . Smokeless tobacco: Never Used  . Alcohol use Yes     Comment: occ  . Drug use: No  . Sexual activity: No   Other Topics Concern  . None   Social History Narrative  . None    Vitals:    01/23/16 1258  BP: 140/80  Pulse: 89  Resp: 12    Body mass index is 42.23 kg/m.  O2 sat at RA 96%   Physical Exam  Constitutional: She is oriented to person, place, and time. She appears well-developed. No distress.  HENT:  Head: Atraumatic.  Mouth/Throat: Oropharynx is clear and moist and mucous membranes are normal.  Eyes: Conjunctivae and EOM are normal. Pupils are equal, round, and reactive to light.  Neck: No thyroid mass and no thyromegaly present.  Cardiovascular: Normal rate and regular rhythm.   No murmur heard. Pulses:      Dorsalis pedis pulses are 2+ on the right side, and 2+ on the left side.  Respiratory: Effort normal and breath sounds normal. No respiratory distress.  GI: Soft. Bowel sounds are normal. She exhibits no mass. There is no hepatomegaly. There is no tenderness.  Musculoskeletal: She exhibits no edema.  Lymphadenopathy:    She has no cervical adenopathy.       Right: No supraclavicular adenopathy present.       Left: No supraclavicular adenopathy present.  Neurological: She is alert and oriented to person, place, and time. She has normal strength. No cranial nerve deficit. Coordination and gait normal.  Skin: Skin is warm. No erythema.  Psychiatric: Her speech is normal. Her mood appears anxious.  Well groomed, good eye contact.    Diabetic foot exam:  Monofilament normal bilateral. Peripheral pulses present (DP). No calluses No hypertrophic/long toenails.   ASSESSMENT AND PLAN:     Chemistry      Component Value Date/Time   NA 137 01/23/2016 1400   K 4.2 01/23/2016 1400   CL 102 01/23/2016 1400   CO2 26 01/23/2016 1400   BUN 18 01/23/2016 1400   CREATININE 0.87 01/23/2016 1400      Component Value Date/Time   CALCIUM 9.9 01/23/2016 1400   ALKPHOS 70 01/23/2016 1400   AST 9 01/23/2016 1400   ALT 13 01/23/2016 1400   BILITOT 0.3 01/23/2016 1400      Lab Results  Component Value Date   WBC 10.8 (H) 01/23/2016   HGB 14.1  01/23/2016   HCT 41.5 01/23/2016   MCV 73.1 (L) 01/23/2016   PLT 281.0 01/23/2016   Lab Results  Component Value Date   TSH 2.14 01/23/2016    Patricia Bell was seen today for new patient (initial visit).  Diagnoses and all orders for this visit:  Type 2 diabetes mellitus with complication, without long-term current use of insulin ([place])  HgA1C was at goal 12/2014, today lab pending. Further recommendations in regard to treatment will be given according to lab result. Regular exercise and healthy diet with avoidance of added sugar food intake is an important part of treatment and recommended. Annual eye exam, periodic dental and foot care recommended. F/U in 5-6 months  -     Hemoglobin A1c -     CMP -     Microalbumin/Creatinine Ratio, Urine  Dysfunctional uterine bleeding  Pelvic examination differed for next OV. Pregnancy test negative.  Wt loss may help. Work-up ordered, further recommendations will be given accordingly. Educated about warning signs.  -     CBC -  TSH -     POCT urine pregnancy -     US OB Transvaginal; Future  Abdominal pain, LLQ   Today examination otherwise normal. For now dietary instructions: smaller more frequent meals instead 3 big ones. Bland diet. Instructed about warning signs.  -     CMP -     CBC -     Lipase -     C difficile Toxins A+B W/Rflx -     US OB Transvaginal; Future  Chronic diarrhea  ? IBS. Low fat diet. Adequate hydration. Further recommendations will be given according to lab results.   -     Lipase -     C difficile Toxins A+B W/Rflx  BMI 40.0-44.9, adult (HCC)   We discussed benefits of wt loss as well as adverse effects of obesity. Consistency with healthy diet and physical activity recommended. Brisk walking for 15-30 min as tolerated.   -     TSH          Doylene Splinter G. Martin[place], MD  [facility]. [place] office.

## 2016-01-24 ENCOUNTER — Other Ambulatory Visit: Payer: Self-pay

## 2016-01-24 LAB — HEMOGLOBIN A1C
Hgb A1c MFr Bld: 12.9 % — ABNORMAL HIGH (ref <5.7)
MEAN PLASMA GLUCOSE: 324 mg/dL

## 2016-01-24 MED ORDER — DULAGLUTIDE 0.75 MG/0.5ML ~~LOC~~ SOAJ: SUBCUTANEOUS | 0 refills | Status: DC

## 2016-01-25 ENCOUNTER — Encounter: Payer: Self-pay | Admitting: Family Medicine

## 2016-01-26 ENCOUNTER — Other Ambulatory Visit: Payer: Self-pay

## 2016-01-26 ENCOUNTER — Telehealth: Payer: Self-pay

## 2016-01-26 DIAGNOSIS — R1032 Left lower quadrant pain: Secondary | ICD-10-CM

## 2016-01-26 DIAGNOSIS — N938 Other specified abnormal uterine and vaginal bleeding: Secondary | ICD-10-CM

## 2016-01-26 NOTE — Telephone Encounter (Signed)
Received phone call from [place] that the order put in is not the correct order. Got the correct IMG number and new order placed.

## 2016-01-30 ENCOUNTER — Other Ambulatory Visit: Payer: Self-pay

## 2016-01-30 DIAGNOSIS — R1032 Left lower quadrant pain: Secondary | ICD-10-CM

## 2016-01-31 ENCOUNTER — Telehealth: Payer: Self-pay

## 2016-01-31 ENCOUNTER — Encounter: Payer: Self-pay | Admitting: Family Medicine

## 2016-01-31 NOTE — Telephone Encounter (Signed)
Prior Authorization for Dulaglutide (TRULICITY) [id number] SOPN initiated.  Key: [id number]

## 2016-01-31 NOTE — Telephone Encounter (Signed)
error 

## 2016-02-06 ENCOUNTER — Telehealth: Payer: Self-pay

## 2016-02-06 NOTE — Telephone Encounter (Signed)
PA for trulicity was denied. Is there something else you want to send in?

## 2016-02-07 ENCOUNTER — Encounter: Payer: Self-pay | Admitting: Family Medicine

## 2016-02-07 ENCOUNTER — Ambulatory Visit
Admission: RE | Admit: 2016-02-07 | Discharge: 2016-02-07 | Disposition: A | Discharge status: Home / Self Care | Payer: BLUE CROSS/BLUE SHIELD | Source: Ambulatory Visit | Attending: Family Medicine | Admitting: Family Medicine

## 2016-02-07 ENCOUNTER — Other Ambulatory Visit: Payer: Self-pay

## 2016-02-07 DIAGNOSIS — R1032 Left lower quadrant pain: Secondary | ICD-10-CM

## 2016-02-07 DIAGNOSIS — N938 Other specified abnormal uterine and vaginal bleeding: Secondary | ICD-10-CM

## 2016-02-07 MED ORDER — INSULIN PEN NEEDLE 32G X 4 MM MISC: 0 refills | Status: DC

## 2016-02-07 MED ORDER — SITAGLIPTIN PHOSPHATE 100 MG PO TABS: 100.0000 mg | ORAL_TABLET | Freq: Every day | ORAL | 1 refills | Status: DC

## 2016-02-07 MED ORDER — INSULIN GLARGINE 100 UNIT/ML SOLOSTAR PEN
PEN_INJECTOR | SUBCUTANEOUS | 0 refills | Status: DC
Start: 2016-02-07 — End: 2016-02-21

## 2016-02-07 NOTE — Telephone Encounter (Signed)
Rx sent to pharmacy for Lantus, Januvia, and pen needles for Lantus pen. Message sent to patient via mychart, told patient to let us know if she needs to come in to learn how to use the pen.

## 2016-02-07 NOTE — Telephone Encounter (Signed)
I really would like for her to start basal insulin, Lantus 15 U daily. Other options to add to insulin Victoza starting 0.6 mg daily [place] then increase to 1.2 mg in 2-3 weeks. Oral options: Januvia 100 mg daily. I do not think one medication will bring HgA1C to goal. We can adjust treatment, titrate up or down according to BS's and life style changes.  Thanks, BJ

## 2016-02-08 ENCOUNTER — Encounter: Payer: Self-pay | Admitting: Family Medicine

## 2016-02-21 ENCOUNTER — Ambulatory Visit (INDEPENDENT_AMBULATORY_CARE_PROVIDER_SITE_OTHER): Payer: BLUE CROSS/BLUE SHIELD | Admitting: Family Medicine

## 2016-02-21 ENCOUNTER — Encounter: Payer: Self-pay | Admitting: Family Medicine

## 2016-02-21 VITALS — BP 140/90 | HR 93 | Temp 98.1°F | Resp 12 | Ht 64.0 in | Wt 245.0 lb

## 2016-02-21 DIAGNOSIS — Z111 Encounter for screening for respiratory tuberculosis: Secondary | ICD-10-CM

## 2016-02-21 DIAGNOSIS — N938 Other specified abnormal uterine and vaginal bleeding: Secondary | ICD-10-CM | POA: Diagnosis not present

## 2016-02-21 DIAGNOSIS — E118 Type 2 diabetes mellitus with unspecified complications: Secondary | ICD-10-CM | POA: Diagnosis not present

## 2016-02-21 DIAGNOSIS — K529 Noninfective gastroenteritis and colitis, unspecified: Secondary | ICD-10-CM

## 2016-02-21 DIAGNOSIS — G4700 Insomnia, unspecified: Secondary | ICD-10-CM

## 2016-02-21 DIAGNOSIS — Z6841 Body Mass Index (BMI) 40.0 and over, adult: Secondary | ICD-10-CM

## 2016-02-21 DIAGNOSIS — F419 Anxiety disorder, unspecified: Secondary | ICD-10-CM | POA: Diagnosis not present

## 2016-02-21 MED ORDER — MELATONIN ER 5 MG PO TBCR: 5.0000 mg | EXTENDED_RELEASE_TABLET | Freq: Every day | ORAL | 3 refills | Status: DC

## 2016-02-21 MED ORDER — FLUOXETINE HCL 20 MG PO TABS: 20.0000 mg | ORAL_TABLET | Freq: Every day | ORAL | 3 refills | Status: DC

## 2016-02-21 MED ORDER — INSULIN GLARGINE 100 UNIT/ML SOLOSTAR PEN: PEN_INJECTOR | SUBCUTANEOUS | 1 refills | Status: DC

## 2016-02-21 NOTE — Progress Notes (Signed)
Pre visit review using our clinic review tool, if applicable. No additional management support is needed unless otherwise documented below in the visit note. 

## 2016-02-21 NOTE — Patient Instructions (Addendum)
A few things to remember from today's visit:   Type 2 diabetes mellitus with complication, without long-term current use of insulin ([place]) - Plan: Insulin Glargine (LANTUS SOLOSTAR) 100 UNIT/ML Solostar Pen  Screening for tuberculosis - Plan: CANCELED: TB Skin Test  BMI 40.0-44.9, adult (HCC)  Anxiety disorder, unspecified - Plan: FLUoxetine (PROZAC) 20 MG tablet  Chronic diarrhea  Dysfunctional uterine bleeding - Plan: Ambulatory referral to Gynecology  Fluoxetine and start half tablet for 5 days and increase to a tablet if tolerating well Let me know through  My chart how you're doing with the medication in about 3-4weeks.   HgA1C goal < 7.0. Avoid sugar added food:regular soft drinks, energy drinks, and sports drinks. candy. cakes. cookies. pies and cobblers. sweet rolls, pastries, and donuts. fruit drinks, such as fruitades and fruit punch. dairy desserts, such as ice cream  Mediterranean diet has showed benefits for sugar control.  How much and what type of carbohydrate foods are important for managing diabetes. The balance between how much insulin is in your body and the carbohydrate you eat makes a difference in your blood glucose levels.  Fasting blood sugar ideally 130 or less, 2 hours after meals less than 180.   Regular exercise also will help with controlling disease, daily brisk walking as tolerated for 15-30 min definitively will help.   Avoid skipping meals, blood sugar might drop and cause serious problems. Remember checking feet periodically, good dental hygiene, and annual eye exam.   Lantus increased by 5 units, continue 20 units daily, in a week if blood sugars still 200 and more increase it to 25 U.   Please be sure medication list is accurate. If a new problem present, please set up appointment sooner than planned today.

## 2016-02-21 NOTE — Progress Notes (Signed)
HPI:   [name] is a 35 y.o. female, who is here today to follow on some issues addressed last OV and some of her chronic medical problems.   01/23/2016, at that time she was complaining of lower abdominal pain, diarrhea, vaginal spotting.  I held on pelvic exam because I was planning on doing it today depending on lab results, transvaginal ultrasound was ordered and this revealed fibroids; so I recommended gyn evaluation. She has not arranged appt with Gyn, she does not have one and not sure about who she can see.   She still having intermittently vaginal spotting, last week until 1-2 days ago. She denies sexual activity, for 3 years at least. No vaginal discharge. Denies history of abnormal Pap smear.  - Also long Hx of loose stools. She states that diarrhea improved greatly with improving her diet. Abdominal pain resolved after she decreased meat intake. She is reporting that her sister has history of Crohn disease. She denies any blood in the stool, associated fever, arthralgias, or abnormal weight loss.   Lab Results  Component Value Date   LIPASE 5.0 (L) 01/23/2016     Chemistry      Component Value Date/Time   NA 137 01/23/2016 1400   K 4.2 01/23/2016 1400   CL 102 01/23/2016 1400   CO2 26 01/23/2016 1400   BUN 18 01/23/2016 1400   CREATININE 0.87 01/23/2016 1400      Component Value Date/Time   CALCIUM 9.9 01/23/2016 1400   ALKPHOS 70 01/23/2016 1400   AST 9 01/23/2016 1400   ALT 13 01/23/2016 1400   BILITOT 0.3 01/23/2016 1400     Lab Results  Component Value Date   TSH 2.14 01/23/2016     Diabetes Mellitus II:    Has been poorly controlled. Currently on Lantus 15 U daily,Januvia 100 mg daily, and a week ago started Trulicity [id number] mg weekly.  Checking BS's : Yes, FG 200's Hypoglycemia:Denies  She is tolerating medications well. She denies abdominal pain, nausea, vomiting, polydipsia, polyuria, or polyphagia. No numbness,  tingling, or burning. She is trying to follow a healthier diet, she is not exercising regularly yet.    Lab Results  Component Value Date   CREATININE 0.87 01/23/2016   BUN 18 01/23/2016   NA 137 01/23/2016   K 4.2 01/23/2016   CL 102 01/23/2016   CO2 26 01/23/2016    Lab Results  Component Value Date   HGBA1C 12.9 (H) 01/23/2016   Lab Results  Component Value Date   MICROALBUR 1.8 01/23/2016     Concerns today: anxiety.   Anxiety:  She states that she has long history of anxiety but seems to be worse for the past few months, she is having episodes of sudden tachypnea, racing heart, sweating hands, and shaking sensation, now they have been daily, sometimes associated diarrhea.  In the past she has been on Conazepam, which she didn't tolerate well because it caused drowsiness.  She denies depressed mood or suicidal thoughts. Insomnia, goes to bed around 9 pm and falls asleep about 3 am. Sleeps about 3 hours, getting worse since April 2017.  She denies history of bipolar disorder Family history negative for psychiatry disorder.   Review of Systems  Constitutional: Positive for fatigue. Negative for appetite change, fever and unexpected weight change.  HENT: Negative for dental problem, mouth sores, sore throat and trouble swallowing.   Eyes: Negative for pain, redness and visual disturbance.  Respiratory:  Negative for cough, shortness of breath and wheezing.   Cardiovascular: Negative for chest pain, palpitations and leg swelling.  Gastrointestinal: Positive for diarrhea (improved). Negative for abdominal distention, abdominal pain, blood in stool, nausea and vomiting.  Endocrine: Negative for cold intolerance, heat intolerance, polydipsia, polyphagia and polyuria.  Genitourinary: Positive for menstrual problem and vaginal bleeding. Negative for decreased urine volume, difficulty urinating, dysuria, hematuria, vaginal discharge and vaginal pain.  Musculoskeletal:  Negative for back pain and myalgias.  Neurological: Negative for syncope, weakness, numbness and headaches.  Hematological: Negative for adenopathy. Does not bruise/bleed easily.  Psychiatric/Behavioral: Positive for sleep disturbance. Negative for confusion. The patient is nervous/anxious.       Current Outpatient Prescriptions on File Prior to Visit  Medication Sig Dispense Refill  . Dulaglutide (TRULICITY) A999333 [id number] SOPN Inject 0.75mg  weekly for 4 weeks. 4 pen 0  . Insulin Pen Needle (BD PEN NEEDLE NANO U/F) 32G X 4 MM MISC Use with Lantus pens. 100 each 0  . sitaGLIPtin (JANUVIA) 100 MG tablet Take 1 tablet (100 mg total) by mouth daily. 30 tablet 1   No current facility-administered medications on file prior to visit.      Past Medical History:  Diagnosis Date  . Anxiety   . Depression   . Diabetes mellitus without complication ([place])   . Hypertension    Allergies  Allergen Reactions  . Shellfish Allergy Itching, Nausea And Vomiting and Swelling    Social History   Social History  . Marital status: Single    Spouse name: N/A  . Number of children: N/A  . Years of education: N/A   Social History Main Topics  . Smoking status: Never Smoker  . Smokeless tobacco: Never Used  . Alcohol use Yes     Comment: occ  . Drug use: No  . Sexual activity: No   Other Topics Concern  . None   Social History Narrative  . None    Vitals:   02/21/16 1414  BP: 140/90  Pulse: 93  Resp: 12  Temp: 98.1 F (36.7 C)   Body mass index is 42.05 kg/m.   Wt Readings from Last 3 Encounters:  02/21/16 245 lb (111.1 kg)  01/23/16 246 lb (111.6 kg)  03/09/15 284 lb (128.8 kg)      Physical Exam  Nursing note and vitals reviewed. Constitutional: She is oriented to person, place, and time. She appears well-developed. No distress.  HENT:  Head: Atraumatic.  Mouth/Throat: Oropharynx is clear and moist and mucous membranes are normal.  Eyes: Conjunctivae and EOM are  normal. Pupils are equal, round, and reactive to light.  Neck: No tracheal deviation present. Thyromegaly (mild) present. No thyroid mass present.  Cardiovascular: Normal rate and regular rhythm.   No murmur heard. Pulses:      Dorsalis pedis pulses are 2+ on the right side, and 2+ on the left side.  Respiratory: Effort normal and breath sounds normal. No respiratory distress.  GI: Soft. She exhibits no mass. There is no hepatomegaly. There is no tenderness.  Genitourinary:  Genitourinary Comments: differed to gyn.  Musculoskeletal: She exhibits no edema.  Lymphadenopathy:    She has no cervical adenopathy.  Neurological: She is alert and oriented to person, place, and time. She has normal strength. Coordination normal.  Skin: Skin is warm. No erythema.  Psychiatric: Her speech is normal. Her mood appears anxious. She expresses no suicidal ideation. She expresses no suicidal plans.  Well groomed, good eye contact.  ASSESSMENT AND PLAN:     Javiona was seen today for follow-up.  Diagnoses and all orders for this visit:  Type 2 diabetes mellitus with complication, without long-term current use of insulin (HCC)  Still FG 200's. Today Lantus increased from 15 units to 20 units. No changes on Trulicity or Januvia. Continue working on dietary changes.  -     Insulin Glargine (LANTUS SOLOSTAR) 100 UNIT/ML Solostar Pen; Inject 20 units into the skin daily.  Anxiety disorder, unspecified  After discussion of treatment options, she agrees with trying Fluoxetine. Some side effects discussed. Instructed about warning signs. F/U in 6 weeks, before if needed.  -     FLUoxetine (PROZAC) 20 MG tablet; Take 1 tablet (20 mg total) by mouth daily.  Insomnia, unspecified   Good sleep hygiene. OTC Melatonin may help.  I will consider Doxepin next office visit for anxiety, he might also help with diarrhea.   -     Melatonin ER 5 MG TBCR; Take 5 mg by mouth at bedtime.  Chronic  diarrhea  We discussed possible causes: IBS-D,  IBD, anxiety among some. Work-up negative otherwise. She prefers to hold on further work-up , she thinks it is related yo her poorly controlled DM. Adequate hydration, Metamucil OTC may help. Explained SSRI's may aggravate diarrhea but usually just for a few days or weeks. With next lab work CRP will be added.   Dysfunctional uterine bleeding  Instructed about warning signs. Seems to be related to uterine fibroids.  -     Ambulatory referral to Gynecology  BMI 40.0-44.9, adult [facility])  We discussed benefits of wt loss as well as adverse effects of obesity. Consistency with healthy diet and physical activity recommended. Weight Watchers is a good option as well as daily brisk walking for 15-30 min as tolerated.  Screening for tuberculosis -     Cancel: TB Skin Test        -Ms. Patricia Bell was advised to return sooner than planned today if new concerns arise.       Lamona Eimer G. [place], MD  [facility]. [place] office.

## 2016-02-22 ENCOUNTER — Telehealth: Payer: Self-pay | Admitting: Obstetrics & Gynecology

## 2016-02-22 NOTE — Telephone Encounter (Signed)
Called and left a message for patient to call back to schedule a new patient doctor referral. °

## 2016-02-27 ENCOUNTER — Ambulatory Visit (INDEPENDENT_AMBULATORY_CARE_PROVIDER_SITE_OTHER): Payer: BLUE CROSS/BLUE SHIELD | Admitting: Obstetrics and Gynecology

## 2016-02-27 ENCOUNTER — Encounter: Payer: Self-pay | Admitting: Obstetrics and Gynecology

## 2016-02-27 VITALS — BP 124/80 | HR 84 | Resp 16 | Ht 65.25 in | Wt 245.0 lb

## 2016-02-27 DIAGNOSIS — D259 Leiomyoma of uterus, unspecified: Secondary | ICD-10-CM | POA: Diagnosis not present

## 2016-02-27 DIAGNOSIS — N946 Dysmenorrhea, unspecified: Secondary | ICD-10-CM | POA: Diagnosis not present

## 2016-02-27 DIAGNOSIS — N762 Acute vulvitis: Secondary | ICD-10-CM

## 2016-02-27 DIAGNOSIS — E1165 Type 2 diabetes mellitus with hyperglycemia: Secondary | ICD-10-CM

## 2016-02-27 DIAGNOSIS — E663 Overweight: Secondary | ICD-10-CM

## 2016-02-27 DIAGNOSIS — R6882 Decreased libido: Secondary | ICD-10-CM

## 2016-02-27 DIAGNOSIS — Z113 Encounter for screening for infections with a predominantly sexual mode of transmission: Secondary | ICD-10-CM

## 2016-02-27 DIAGNOSIS — N939 Abnormal uterine and vaginal bleeding, unspecified: Secondary | ICD-10-CM | POA: Diagnosis not present

## 2016-02-27 DIAGNOSIS — Z124 Encounter for screening for malignant neoplasm of cervix: Secondary | ICD-10-CM | POA: Diagnosis not present

## 2016-02-27 DIAGNOSIS — N719 Inflammatory disease of uterus, unspecified: Secondary | ICD-10-CM | POA: Diagnosis not present

## 2016-02-27 LAB — CBC WITH DIFFERENTIAL/PLATELET
BASOS ABS: 0 {cells}/uL (ref 0–200)
BASOS PCT: 0 %
EOS ABS: 198 {cells}/uL (ref 15–500)
Eosinophils Relative: 2 %
HEMATOCRIT: 42.5 % (ref 35.0–45.0)
Hemoglobin: 14.1 g/dL (ref 11.7–15.5)
LYMPHS PCT: 36 %
Lymphs Abs: 3564 cells/uL (ref 850–3900)
MCH: 24.9 pg — AB (ref 27.0–33.0)
MCHC: 33.2 g/dL (ref 32.0–36.0)
MCV: 75 fL — AB (ref 80.0–100.0)
MONO ABS: 495 {cells}/uL (ref 200–950)
MPV: 10.4 fL (ref 7.5–12.5)
Monocytes Relative: 5 %
NEUTROS ABS: 5643 {cells}/uL (ref 1500–7800)
Neutrophils Relative %: 57 %
Platelets: 282 10*3/uL (ref 140–400)
RBC: 5.67 MIL/uL — ABNORMAL HIGH (ref 3.80–5.10)
RDW: 14.7 % (ref 11.0–15.0)
WBC: 9.9 10*3/uL (ref 3.8–10.8)

## 2016-02-27 MED ORDER — NORETHINDRONE 0.35 MG PO TABS: 1.0000 | ORAL_TABLET | Freq: Every day | ORAL | 0 refills | Status: DC

## 2016-02-27 MED ORDER — DOXYCYCLINE HYCLATE 100 MG PO CAPS: 100.0000 mg | ORAL_CAPSULE | Freq: Two times a day (BID) | ORAL | 0 refills | Status: DC

## 2016-02-27 NOTE — Progress Notes (Signed)
35 y.o. G0P0000 SingleAfrican AmericanF here for a consultation from Dr Betty Martinique for abnormal uterine bleeding. Not anemic, normal TSH, ultrasound with 3 myoma's, largest 6.4 cm. She is diabetic with poor control. Recent HgbA1C over 12. Medication has been adjusted and sugars are improving. Cycles q 22-3 weeks x 7 days. Can be light the entire time, or can be heavy. She can saturate a super tampon in 3-4 hours. No spotting in between. She has intermittent dysmenorrhea, mostly tolerable. Not currently sexually active, has been active in the past.  In the past she was on OCP's, did okay.  Period Duration (Days): cycle comes on every other week some months and last about 7 days Period Pattern: (!) Irregular Menstrual Flow: Light, Heavy Menstrual Control: Tampon Menstrual Control Change Freq (Hours): changes super tampon about 3 times a day on heavy days Dysmenorrhea: (!) Moderate Dysmenorrhea Symptoms: Cramping  She does c/o intermittent BLQ abdominal pain, can be on either side, lasts for a few hours. Crampy pain like her menstrual pain.  She was having some pain with intercourse. Last sexually active 2-3 years ago.  She has depression and anxiety. Having panic attacks. She just started prozac last week.  She is dating, same partner x 8 years. Taking a break from from sex. No libido, never had an orgasm.  In 2015 she lost 67 lbs. She has gained it all back + some since then. Under lots of stress.  She c/o intermittent vulvar itching and irritation for the last 2 weeks.    Patient's last menstrual period was 01/30/2016.          Sexually active: No.  The current method of family planning is abstinence.    Exercising: No.  The patient does not participate in regular exercise at present. Smoker:  no  Health Maintenance: Pap:  [id number] WNL History of abnormal Pap:  no MMG:  03-25-13 WNL- start routine screening at age 76 Colonoscopy:  Never BMD:   Never TDaP:  11-19-11 Gardasil: No   reports that she has never smoked. She has never used smokeless tobacco. She reports that she drinks alcohol. She reports that she does not use drugs. She drinks rare ETOH. She is an Administrator at [employer]. Lives alone. She has her masters in Product/process development scientist.   Past Medical History:  Diagnosis Date  . Abnormal uterine bleeding   . Anxiety   . Depression   . Diabetes mellitus without complication ([place])   . Fibroid   . Hypertension     No past surgical history on file.  Current Outpatient Prescriptions  Medication Sig Dispense Refill  . Dulaglutide (TRULICITY) A999333 [id number] SOPN Inject 0.75mg  weekly for 4 weeks. 4 pen 0  . FLUoxetine (PROZAC) 20 MG tablet Take 1 tablet (20 mg total) by mouth daily. 30 tablet 3  . Insulin Glargine (LANTUS SOLOSTAR) 100 UNIT/ML Solostar Pen Inject 20 units into the skin daily. 5 pen 1  . Insulin Pen Needle (BD PEN NEEDLE NANO U/F) 32G X 4 MM MISC Use with Lantus pens. 100 each 0  . Melatonin ER 5 MG TBCR Take 5 mg by mouth at bedtime. 90 tablet 3  . sitaGLIPtin (JANUVIA) 100 MG tablet Take 1 tablet (100 mg total) by mouth daily. 30 tablet 1   No current facility-administered medications for this visit.     Family History  Problem Relation Age of Onset  . Diabetes Mother   . Hypertension Mother   . Hypertension Father   . Diabetes  Sister   . Diabetes Maternal Aunt   . Diabetes Maternal Uncle   . Heart disease Maternal Grandmother   . Diabetes Maternal Grandmother   . Stroke Maternal Grandmother     Review of Systems  Constitutional: Negative.   HENT: Negative.   Eyes: Negative.   Respiratory: Negative.   Cardiovascular: Negative.   Gastrointestinal: Negative.   Endocrine: Negative.   Genitourinary: Positive for menstrual problem.       DUB  Musculoskeletal: Negative.   Skin: Negative.   Allergic/Immunologic: Negative.   Neurological: Negative.   Psychiatric/Behavioral: Negative.     Exam:   BP 124/80 (BP Location: Right Arm,  Patient Position: Sitting, Cuff Size: Large)   Pulse 84   Resp 16   Ht 5' 5.25" (1.657 m)   Wt 245 lb (111.1 kg)   LMP 01/30/2016   BMI 40.46 kg/m   Weight change: @WEIGHTCHANGE @ Height:   Height: 5' 5.25" (165.7 cm)  Ht Readings from Last 3 Encounters:  02/27/16 5' 5.25" (1.657 m)  02/21/16 5\' 4"  (1.626 m)  01/23/16 5\' 4"  (1.626 m)    General appearance: alert, cooperative and appears stated age Head: Normocephalic, without obvious abnormality, atraumatic Neck: no adenopathy, supple, symmetrical, trachea midline and thyroid normal to inspection and palpation Lungs: clear to auscultation bilaterally Breasts: normal appearance, no masses or tenderness Heart: regular rate and rhythm Abdomen: soft, non-tender; bowel sounds normal; no masses,  no organomegaly Extremities: extremities normal, atraumatic, no cyanosis or edema Skin: Skin color, texture, turgor normal. No rashes or lesions Lymph nodes: Cervical, supraclavicular, and axillary nodes normal. No abnormal inguinal nodes palpated Neurologic: Grossly normal   Pelvic: External genitalia:  no lesions              Urethra:  normal appearing urethra with no masses, tenderness or lesions              Bartholins and Skenes: normal                 Vagina: normal appearing vagina with moderate amount of blood, slightly red, no discharge, no lesions              Cervix: no lesions, no CMT               Bimanual Exam:  Uterus:  uterus is retroverted, mobile, 8-10 week sized and tender              Adnexa: no mass, fullness, tenderness               Rectovaginal: Confirms               Anus:  normal sphincter tone, no lesions Pelvic floor: mildly tender on the left (uterus is more tender)  Chaperone was present for exam.  Ultrasound images reviewed with the patient. Her largest myoma is posterior to her LUS, near her cavity. No obvious intracavitary masses  A. Abnormal uterine bleeding Fibroid uterus Dysmenorrhea Tender uterus,  concerning for endometritis Poorly controlled diabetes Absent libido, orgasmic dysfuction Interested in having children Vulvitis H/O dyspareunia, not currently active with her partner. May need pelvic floor PT in the future   P:  CBC with diff Wet prep probe Pap with hpv Will treat with doxycycline for presumed endometritis F/U in 2 weeks Discussed options of micronor or Mirena IUD to try and help her bleeding, will start with micronor Discussed the importance of getting her diabetes under control and weight loss Will have her calendar  her cycles and f/u in 3 months  CC: Dr Betty Martinique Letter sent

## 2016-02-28 ENCOUNTER — Telehealth: Payer: Self-pay | Admitting: *Deleted

## 2016-02-28 LAB — WET PREP BY MOLECULAR PROBE
CANDIDA SPECIES: POSITIVE — AB
Gardnerella vaginalis: NEGATIVE
TRICHOMONAS VAG: NEGATIVE

## 2016-02-28 MED ORDER — FLUCONAZOLE 150 MG PO TABS: 150.0000 mg | ORAL_TABLET | Freq: Once | ORAL | 0 refills | Status: AC

## 2016-02-28 NOTE — Telephone Encounter (Signed)
-----   Message from Salvadore Dom, MD sent at 02/28/2016 11:13 AM EDT ----- Please inform the patient that her WBC and HGB are normal. She does have yeast. Please treat with diflucan 150 mg po x 1, repeat x 1 in 72 hours if still having symptoms.  Pap and genprobe are pending.

## 2016-02-28 NOTE — Telephone Encounter (Signed)
Spoke with patient and gave results. Sent in RX for Diflucan per Dr. Talbert Nan

## 2016-02-29 LAB — IPS PAP TEST WITH HPV

## 2016-02-29 LAB — IPS N GONORRHOEA AND CHLAMYDIA BY PCR

## 2016-03-12 ENCOUNTER — Ambulatory Visit (INDEPENDENT_AMBULATORY_CARE_PROVIDER_SITE_OTHER): Payer: BLUE CROSS/BLUE SHIELD | Admitting: Obstetrics and Gynecology

## 2016-03-12 ENCOUNTER — Encounter: Payer: Self-pay | Admitting: Obstetrics and Gynecology

## 2016-03-12 VITALS — BP 130/80 | HR 88 | Resp 14 | Wt 241.0 lb

## 2016-03-12 DIAGNOSIS — N762 Acute vulvitis: Secondary | ICD-10-CM

## 2016-03-12 DIAGNOSIS — R87615 Unsatisfactory cytologic smear of cervix: Secondary | ICD-10-CM

## 2016-03-12 DIAGNOSIS — N719 Inflammatory disease of uterus, unspecified: Secondary | ICD-10-CM | POA: Diagnosis not present

## 2016-03-12 DIAGNOSIS — N939 Abnormal uterine and vaginal bleeding, unspecified: Secondary | ICD-10-CM

## 2016-03-12 MED ORDER — BETAMETHASONE VALERATE 0.1 % EX OINT: TOPICAL_OINTMENT | CUTANEOUS | 0 refills | Status: DC

## 2016-03-12 NOTE — Progress Notes (Signed)
GYNECOLOGY  VISIT   HPI: 35 y.o.   Single  African American  female   [place] with Patient's last menstrual period was 03/01/2016.   here for follow up endometritis. Patient c/o vaginal itching   The patient was seen 2 weeks ago for AUB. She has a fibroid uterus and was noted to be very tender on exam. Her pap wasn't satisfactory secondary to blood. Negative GC/CT, normal CBC, prior normal TSH. She was started on micronor for AUB and antibiotics for presumed endometritis. Her vaginitis probe was + yeast and diflucan was prescribed. She is still on her antibiotics, the cramping and pain are better. She still feels vulvar irritation and soreness. Finished her diflucan. The bleeding has stopped for now.   GYNECOLOGIC HISTORY: Patient's last menstrual period was 03/01/2016. Contraception:OCP Menopausal hormone therapy: none         OB History    Gravida Para Term Preterm AB Living   0 0 0 0 0 0   SAB TAB Ectopic Multiple Live Births   0 0 0 0 0         Patient Active Problem List   Diagnosis Date Noted  . Anxiety disorder, unspecified 02/21/2016  . Type 2 diabetes mellitus ([place]) 05/02/2012  . BMI 40.0-44.9, adult ([place]) 05/02/2012  . Allergic rhinitis 05/02/2012    Past Medical History:  Diagnosis Date  . Abnormal uterine bleeding   . Anxiety   . Depression   . Diabetes mellitus without complication ([place])   . Fibroid   . Hypertension     No past surgical history on file.  Current Outpatient Prescriptions  Medication Sig Dispense Refill  . doxycycline (VIBRAMYCIN) 100 MG capsule Take 1 capsule (100 mg total) by mouth 2 (two) times daily. Take BID for 14 days.  Take with food as can cause GI distress. 28 capsule 0  . Dulaglutide (TRULICITY) [id number] [id number] SOPN Inject 0.75mg  weekly for 4 weeks. 4 pen 0  . FLUoxetine (PROZAC) 20 MG tablet Take 1 tablet (20 mg total) by mouth daily. 30 tablet 3  . Insulin Glargine (LANTUS SOLOSTAR) 100 UNIT/ML Solostar Pen Inject 20 units into the  skin daily. 5 pen 1  . Insulin Pen Needle (BD PEN NEEDLE NANO U/F) 32G X 4 MM MISC Use with Lantus pens. 100 each 0  . Melatonin ER 5 MG TBCR Take 5 mg by mouth at bedtime. 90 tablet 3  . norethindrone (MICRONOR,CAMILA,ERRIN) 0.35 MG tablet Take 1 tablet (0.35 mg total) by mouth daily. 3 Package 0  . sitaGLIPtin (JANUVIA) 100 MG tablet Take 1 tablet (100 mg total) by mouth daily. 30 tablet 1   No current facility-administered medications for this visit.      ALLERGIES: Shellfish allergy  Family History  Problem Relation Age of Onset  . Diabetes Mother   . Hypertension Mother   . Hypertension Father   . Diabetes Sister   . Diabetes Maternal Aunt   . Diabetes Maternal Uncle   . Heart disease Maternal Grandmother   . Diabetes Maternal Grandmother   . Stroke Maternal Grandmother     Social History   Social History  . Marital status: Single    Spouse name: N/A  . Number of children: N/A  . Years of education: N/A   Occupational History  . Not on file.   Social History Main Topics  . Smoking status: Never Smoker  . Smokeless tobacco: Never Used  . Alcohol use Yes     Comment: occ  .  Drug use: No  . Sexual activity: No   Other Topics Concern  . Not on file   Social History Narrative  . No narrative on file    Review of Systems  Constitutional: Negative.   HENT: Negative.   Eyes: Negative.   Respiratory: Negative.   Cardiovascular: Negative.   Genitourinary:       Vaginal itching   Musculoskeletal: Negative.   Skin: Negative.   Neurological: Negative.   Endo/Heme/Allergies: Negative.   Psychiatric/Behavioral: Negative.     PHYSICAL EXAMINATION:    BP 130/80 (BP Location: Right Arm, Patient Position: Sitting, Cuff Size: Large)   Pulse 88   Resp 14   Wt 241 lb (109.3 kg)   LMP 03/01/2016   BMI 39.80 kg/m     General appearance: alert, cooperative and appears stated age Pelvic: External genitalia:  no lesions, erythematous with a small fissure               Urethra:  normal appearing urethra with no masses, tenderness or lesions              Bartholins and Skenes: normal                 Vagina: normal appearing vagina with normal color and discharge, no lesions              Cervix: no lesions              Bimanual Exam:  Uterus:  retroverted, slightly enlarged, mobile, not tender              Adnexa: no mass, fullness, tenderness               Chaperone was present for exam.  Wet prep: no clue, no trich, + wbc KOH: no yeast PH: 4   ASSESSMENT Vulvitis, negative slides Endometritis, tenderness has resolved with antibiotics AUB, just started micronor    PLAN Send vaginitis probe F/U in 12/17, calendar bleeding Complete her course of antibiotics   An After Visit Summary was printed and given to the patient.  CC: Dr Betty Martinique

## 2016-03-13 LAB — WET PREP BY MOLECULAR PROBE
CANDIDA SPECIES: NEGATIVE
Gardnerella vaginalis: NEGATIVE
Trichomonas vaginosis: NEGATIVE

## 2016-03-15 LAB — IPS PAP TEST WITH HPV

## 2016-03-29 ENCOUNTER — Other Ambulatory Visit: Payer: Self-pay | Admitting: Obstetrics and Gynecology

## 2016-03-29 MED ORDER — NORETHINDRONE 0.35 MG PO TABS: 1.0000 | ORAL_TABLET | Freq: Every day | ORAL | 0 refills | Status: DC

## 2016-05-29 ENCOUNTER — Ambulatory Visit: Payer: BLUE CROSS/BLUE SHIELD | Admitting: Obstetrics and Gynecology

## 2016-06-18 ENCOUNTER — Ambulatory Visit: Payer: BLUE CROSS/BLUE SHIELD | Admitting: Obstetrics and Gynecology

## 2016-07-10 ENCOUNTER — Ambulatory Visit: Payer: BLUE CROSS/BLUE SHIELD | Admitting: Obstetrics and Gynecology

## 2016-07-23 ENCOUNTER — Ambulatory Visit: Payer: BLUE CROSS/BLUE SHIELD | Admitting: Obstetrics and Gynecology

## 2016-08-02 ENCOUNTER — Ambulatory Visit: Payer: BLUE CROSS/BLUE SHIELD | Admitting: Obstetrics and Gynecology

## 2016-08-27 ENCOUNTER — Ambulatory Visit: Payer: BLUE CROSS/BLUE SHIELD | Admitting: Obstetrics and Gynecology

## 2016-09-06 ENCOUNTER — Ambulatory Visit: Payer: BLUE CROSS/BLUE SHIELD | Admitting: Obstetrics and Gynecology

## 2016-09-20 ENCOUNTER — Encounter: Payer: Self-pay | Admitting: Obstetrics and Gynecology

## 2016-09-20 ENCOUNTER — Ambulatory Visit (INDEPENDENT_AMBULATORY_CARE_PROVIDER_SITE_OTHER): Payer: PRIVATE HEALTH INSURANCE | Admitting: Obstetrics and Gynecology

## 2016-09-20 VITALS — BP 142/80 | HR 84 | Resp 16 | Wt 229.0 lb

## 2016-09-20 DIAGNOSIS — Z8639 Personal history of other endocrine, nutritional and metabolic disease: Secondary | ICD-10-CM | POA: Diagnosis not present

## 2016-09-20 DIAGNOSIS — D259 Leiomyoma of uterus, unspecified: Secondary | ICD-10-CM | POA: Diagnosis not present

## 2016-09-20 DIAGNOSIS — N946 Dysmenorrhea, unspecified: Secondary | ICD-10-CM | POA: Diagnosis not present

## 2016-09-20 DIAGNOSIS — N920 Excessive and frequent menstruation with regular cycle: Secondary | ICD-10-CM

## 2016-09-20 LAB — CBC
HEMATOCRIT: 37.5 % (ref 35.0–45.0)
Hemoglobin: 12.3 g/dL (ref 11.7–15.5)
MCH: 24.7 pg — ABNORMAL LOW (ref 27.0–33.0)
MCHC: 32.8 g/dL (ref 32.0–36.0)
MCV: 75.5 fL — ABNORMAL LOW (ref 80.0–100.0)
MPV: 9.4 fL (ref 7.5–12.5)
PLATELETS: 325 10*3/uL (ref 140–400)
RBC: 4.97 MIL/uL (ref 3.80–5.10)
RDW: 15.4 % — AB (ref 11.0–15.0)
WBC: 12.8 10*3/uL — AB (ref 3.8–10.8)

## 2016-09-20 LAB — TSH: TSH: 2.05 m[IU]/L

## 2016-09-20 MED ORDER — NAPROXEN SODIUM 550 MG PO TABS: 550.0000 mg | ORAL_TABLET | Freq: Two times a day (BID) | ORAL | 2 refills | Status: DC

## 2016-09-20 NOTE — Progress Notes (Signed)
GYNECOLOGY  VISIT   HPI: 36 y.o.   Single  African American  female   [place] with Patient's last menstrual period was 08/30/2016.   here for follow up abnormal uterine bleeding and fibroid uterus.   She was started on Micronor in the fall to try and lighten her cycles. She had a normal CBC and TSH in the fall. U/S with 3 myoma's, largest 6.4 cm. The micronor is helping a little. Menses now q month x 9-10 days. She is saturating a super + tampon in 30-60 minutes (much heavier in the last several months, no longer bleeding in between cycles). Dysmenorrhea can be severe, advil doesn't really help. Mostly stays in bed with a heating pad, needs to work from home.  Diabetes is under better control, currently off of medication. FS fasting is normally around 115, 2 hour PP 175. Last HgbA1C was 12.9 in 8/17. She has changed her diet and has lost 15 lbs.   GYNECOLOGIC HISTORY: Patient's last menstrual period was 08/30/2016. Contraception:OCP Menopausal hormone therapy: none         OB History    Gravida Para Term Preterm AB Living   0 0 0 0 0 0   SAB TAB Ectopic Multiple Live Births   0 0 0 0 0         Patient Active Problem List   Diagnosis Date Noted  . Anxiety disorder, unspecified 02/21/2016  . Type 2 diabetes mellitus ([place]) 05/02/2012  . BMI 40.0-44.9, adult ([place]) 05/02/2012  . Allergic rhinitis 05/02/2012    Past Medical History:  Diagnosis Date  . Abnormal uterine bleeding   . Anxiety   . Depression   . Diabetes mellitus without complication ([place])   . Fibroid   . Hypertension     No past surgical history on file.  Current Outpatient Prescriptions  Medication Sig Dispense Refill  . Melatonin ER 5 MG TBCR Take 5 mg by mouth at bedtime. 90 tablet 3  . norethindrone (MICRONOR,CAMILA,ERRIN) 0.35 MG tablet Take 1 tablet (0.35 mg total) by mouth daily. 3 Package 0   No current facility-administered medications for this visit.      ALLERGIES: Shellfish allergy  Family  History  Problem Relation Age of Onset  . Diabetes Mother   . Hypertension Mother   . Hypertension Father   . Diabetes Sister   . Diabetes Maternal Aunt   . Diabetes Maternal Uncle   . Heart disease Maternal Grandmother   . Diabetes Maternal Grandmother   . Stroke Maternal Grandmother     Social History   Social History  . Marital status: Single    Spouse name: N/A  . Number of children: N/A  . Years of education: N/A   Occupational History  . Not on file.   Social History Main Topics  . Smoking status: Never Smoker  . Smokeless tobacco: Never Used  . Alcohol use Yes     Comment: occ  . Drug use: No  . Sexual activity: No   Other Topics Concern  . Not on file   Social History Narrative  . No narrative on file    Review of Systems  Constitutional: Negative.   HENT: Negative.   Eyes: Negative.   Respiratory: Negative.   Cardiovascular: Negative.   Gastrointestinal: Negative.   Genitourinary:       Dysmenorrhea  And unscheduled bleeding Pelvic pressure   Musculoskeletal: Negative.   Skin: Negative.   Neurological: Negative.   Endo/Heme/Allergies: Negative.   Psychiatric/Behavioral:  Negative.     PHYSICAL EXAMINATION:    BP (!) 142/80 (BP Location: Right Arm, Patient Position: Sitting, Cuff Size: Normal)   Pulse 84   Resp 16   Wt 229 lb (103.9 kg)   LMP 08/30/2016   BMI 37.82 kg/m     General appearance: alert, cooperative and appears stated age Neck: no adenopathy, supple, symmetrical, trachea midline and thyroid normal to inspection and palpation Abdomen: soft, non-tender; non distended; no masses,  no organomegaly  Pelvic: External genitalia:  no lesions              Urethra:  normal appearing urethra with no masses, tenderness or lesions              Bartholins and Skenes: normal                 Vagina: normal appearing vagina with normal color and discharge, no lesions              Cervix: no lesions              Bimanual Exam:  Uterus:   10-12 week sized, irrregularly shaped, tender fibroid uterus              Adnexa: no mass, fullness, tenderness               Chaperone was present for exam.  ASSESSMENT Worsening menorrhagia on the mini-pill Fibroid uterus, ?enlarging, tender Dysmenorrhea Diabetes, FS have improved, she hasn't had a HgbA1C since last summer    PLAN F/U for a sonohysterogram CBC, Ferritin, HgbA1C, TSH Not currently sexually active, but has a long term partner Anaprox She would like children, I encouraged her to speak with her partner about this Information on the mirena IUD given, told her that fibroids could lesson the effectiveness, but that it could still potentially help    An After Visit Summary was printed and given to the patient.

## 2016-09-21 LAB — HEMOGLOBIN A1C
Hgb A1c MFr Bld: 7.1 % — ABNORMAL HIGH (ref <5.7)
MEAN PLASMA GLUCOSE: 157 mg/dL

## 2016-09-21 LAB — FERRITIN: FERRITIN: 41 ng/mL (ref 10–154)

## 2016-09-27 ENCOUNTER — Telehealth: Payer: Self-pay | Admitting: Obstetrics and Gynecology

## 2016-09-27 ENCOUNTER — Other Ambulatory Visit: Payer: Self-pay | Admitting: *Deleted

## 2016-09-27 NOTE — Telephone Encounter (Signed)
Yes, fine to change sonohysterogram appointment. Will close encounter

## 2016-09-27 NOTE — Telephone Encounter (Signed)
Spoke with patient. Patient states had to cancel 4/24 [facility] would like to reschedule for 5/1 if possible. Patient reports LMP 09/20/16, currently on POP and reports not currently sexually active. Patient scheduled for [facility] on 10/09/16 at 2pm. Advised patient would review scheduling with Dr. Talbert Nan and return call. Patient verbalizes understanding and is agreeable.   Dr. Talbert Nan, ok to keep [facility] scheduled for 5/1?

## 2016-09-27 NOTE — Telephone Encounter (Signed)
Patient cancelled her ultrasound appointment for Tuesday and would like a call to reschedule.

## 2016-10-02 ENCOUNTER — Other Ambulatory Visit: Payer: PRIVATE HEALTH INSURANCE | Admitting: Obstetrics and Gynecology

## 2016-10-02 ENCOUNTER — Other Ambulatory Visit: Payer: PRIVATE HEALTH INSURANCE

## 2016-10-09 ENCOUNTER — Encounter: Payer: Self-pay | Admitting: Obstetrics and Gynecology

## 2016-10-09 ENCOUNTER — Ambulatory Visit (INDEPENDENT_AMBULATORY_CARE_PROVIDER_SITE_OTHER): Payer: PRIVATE HEALTH INSURANCE

## 2016-10-09 ENCOUNTER — Other Ambulatory Visit: Payer: Self-pay | Admitting: Obstetrics and Gynecology

## 2016-10-09 ENCOUNTER — Ambulatory Visit (INDEPENDENT_AMBULATORY_CARE_PROVIDER_SITE_OTHER): Payer: PRIVATE HEALTH INSURANCE | Admitting: Obstetrics and Gynecology

## 2016-10-09 VITALS — BP 118/70 | HR 80 | Resp 16 | Wt 229.0 lb

## 2016-10-09 DIAGNOSIS — D259 Leiomyoma of uterus, unspecified: Secondary | ICD-10-CM

## 2016-10-09 DIAGNOSIS — N946 Dysmenorrhea, unspecified: Secondary | ICD-10-CM | POA: Diagnosis not present

## 2016-10-09 DIAGNOSIS — N920 Excessive and frequent menstruation with regular cycle: Secondary | ICD-10-CM

## 2016-10-09 MED ORDER — NORETHIN ACE-ETH ESTRAD-FE 1-20 MG-MCG(24) PO TABS: 1.0000 | ORAL_TABLET | Freq: Every day | ORAL | 0 refills | Status: DC

## 2016-10-09 NOTE — Progress Notes (Signed)
GYNECOLOGY  VISIT   HPI: 36 y.o.   Single  African American  female   [place] with Patient's last menstrual period was 09/20/2016.   here for follow up  Menorrhagia and irregular cycles. The patient is on micronor for cycle control and contraception. Her cycles are monthly, last for 9-10 days, saturating a super + tampon in 30-60 minutes. No BTB. Severe dysmenorrhea. She is not anemic, normal ferritin.  Recent HgbA1C was 7.1 (down from 12.9).  GYNECOLOGIC HISTORY: Patient's last menstrual period was 09/20/2016. Contraception:OCP Menopausal hormone therapy: none         OB History    Gravida Para Term Preterm AB Living   0 0 0 0 0 0   SAB TAB Ectopic Multiple Live Births   0 0 0 0 0         Patient Active Problem List   Diagnosis Date Noted  . Anxiety disorder, unspecified 02/21/2016  . Type 2 diabetes mellitus ([place]) 05/02/2012  . BMI 40.0-44.9, adult ([place]) 05/02/2012  . Allergic rhinitis 05/02/2012    Past Medical History:  Diagnosis Date  . Abnormal uterine bleeding   . Anxiety   . Depression   . Diabetes mellitus without complication ([place])   . Fibroid   . Hypertension     No past surgical history on file.  Current Outpatient Prescriptions  Medication Sig Dispense Refill  . Melatonin ER 5 MG TBCR Take 5 mg by mouth at bedtime. 90 tablet 3  . naproxen sodium (ANAPROX DS) 550 MG tablet Take 1 tablet (550 mg total) by mouth 2 (two) times daily with a meal. 30 tablet 2  . norethindrone (MICRONOR,CAMILA,ERRIN) 0.35 MG tablet Take 1 tablet (0.35 mg total) by mouth daily. 3 Package 0   No current facility-administered medications for this visit.      ALLERGIES: Shellfish allergy  Family History  Problem Relation Age of Onset  . Diabetes Mother   . Hypertension Mother   . Hypertension Father   . Diabetes Sister   . Diabetes Maternal Aunt   . Diabetes Maternal Uncle   . Heart disease Maternal Grandmother   . Diabetes Maternal Grandmother   . Stroke Maternal  Grandmother     Social History   Social History  . Marital status: Single    Spouse name: N/A  . Number of children: N/A  . Years of education: N/A   Occupational History  . Not on file.   Social History Main Topics  . Smoking status: Never Smoker  . Smokeless tobacco: Never Used  . Alcohol use Yes     Comment: occ  . Drug use: No  . Sexual activity: No   Other Topics Concern  . Not on file   Social History Narrative  . No narrative on file    Review of Systems  Constitutional: Negative.   HENT: Negative.   Eyes: Negative.   Respiratory: Negative.   Cardiovascular: Negative.   Gastrointestinal: Negative.   Genitourinary: Negative.   Musculoskeletal: Negative.   Skin: Negative.   Neurological: Negative.   Endo/Heme/Allergies: Negative.   Psychiatric/Behavioral: Negative.     PHYSICAL EXAMINATION:    BP 118/70 (BP Location: Right Arm, Patient Position: Sitting, Cuff Size: Normal)   Pulse 80   Resp 16   Wt 229 lb (103.9 kg)   LMP 09/20/2016   BMI 37.82 kg/m     General appearance: alert, cooperative and appears stated age  Pelvic: External genitalia:  no lesions  Urethra:  normal appearing urethra with no masses, tenderness or lesions              Bartholins and Skenes: normal                 Vagina: normal appearing vagina with normal color and discharge, no lesions              Cervix: no lesions  Sonohysterogram The procedure and risks of the procedure were reviewed with the patient, consent form was signed. A speculum was placed in the vagina and the cervix was cleansed with betadine. The sonohysterogram catheter was inserted into the uterine cavity without difficulty. Saline was infused under direct observation with the ultrasound. The large lower uterine segment fibroid is full thickness and deviates the cycle. No endometrial polyps or other fibroids deviating the cavity.The catheter was removed.    Chaperone was present for  exam.  ASSESSMENT Fibroid uterus (slightly larger) Menorrhagia, not anemic, normal ferritin. Bleeding has gotten heavier on micronor Severe dysmenorrhea Diabetic, under much better control    PLAN Discussed the mirena IUD for cycle control and contraception. Given her fibroid uterus with slight deviation of her cavity in the LUS, discussed that the IUD may not be as affective and recommend back up contraception with condoms Also discussed future pregnancy  The patient no longer with hypertension and her diabetes is under control off of medication (no vascular issues). We discussed that she could try combination OCP's as well. No contraindications. Risks reviewed, including the risk of blood clots, risk of MI and risk of stroke. She would like to try low dose OCP's Will start OCP's, calendar cycles, f/u in 3 months   An After Visit Summary was printed and given to the patient.  15 minutes face to face time of which over 50% was spent in counseling.

## 2017-01-24 IMAGING — US US PELVIS COMPLETE
1 series · 14 of 25 positions shown · non-contrast
Comparison: None

CLINICAL DATA: Left lower quadrant/ pelvic pain with dysfunctional
uterine bleeding

EXAM:
TRANSABDOMINAL AND TRANSVAGINAL ULTRASOUND OF PELVIS
TECHNIQUE: Study was performed transabdominally to optimize pelvic field of
view evaluation and transvaginally to optimize internal visceral
architecture evaluation.

[Series 1: us pelvis complete · 0.37mm/px · 14 of 53 slices shown]
[im 1/53]
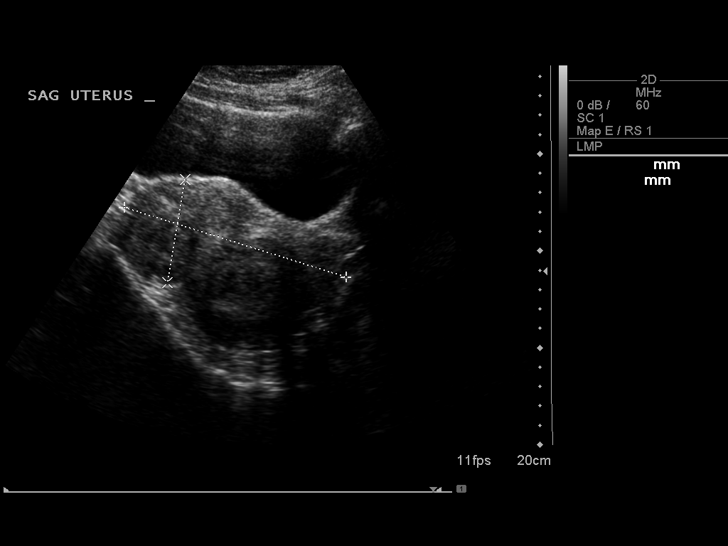
[im 5/53]
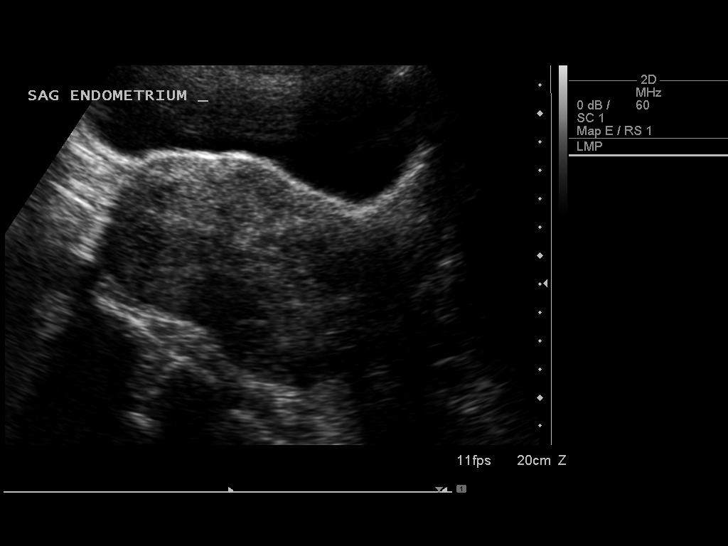
[im 9/53]
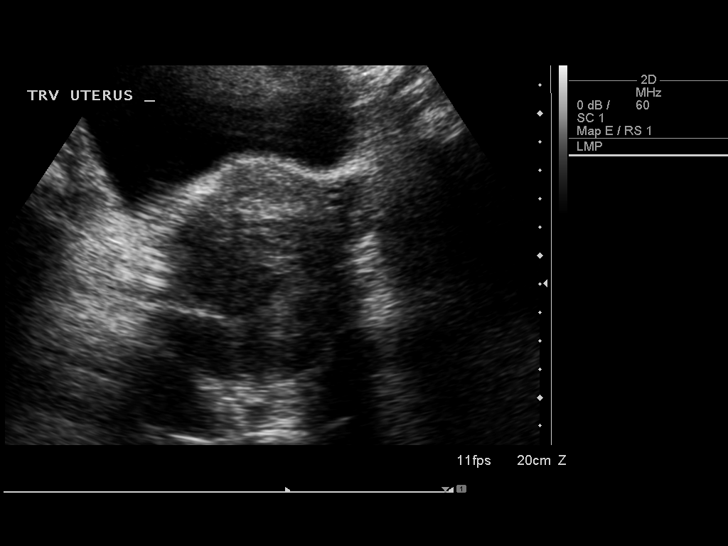
[im 14/53]
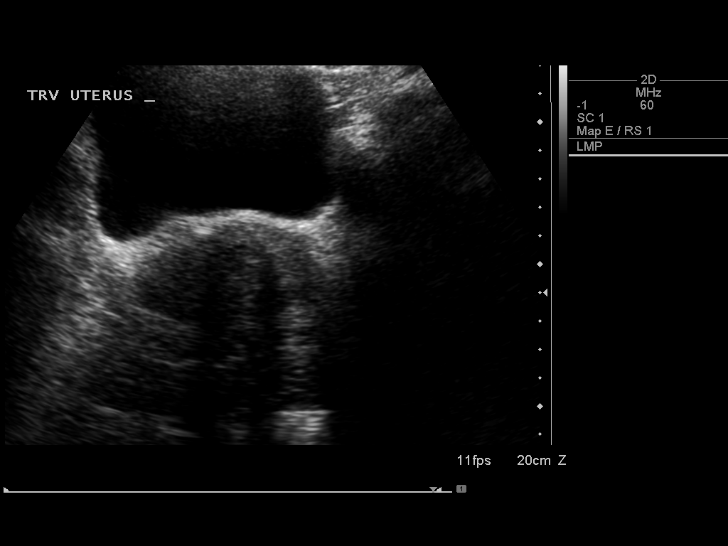
[im 18/53]
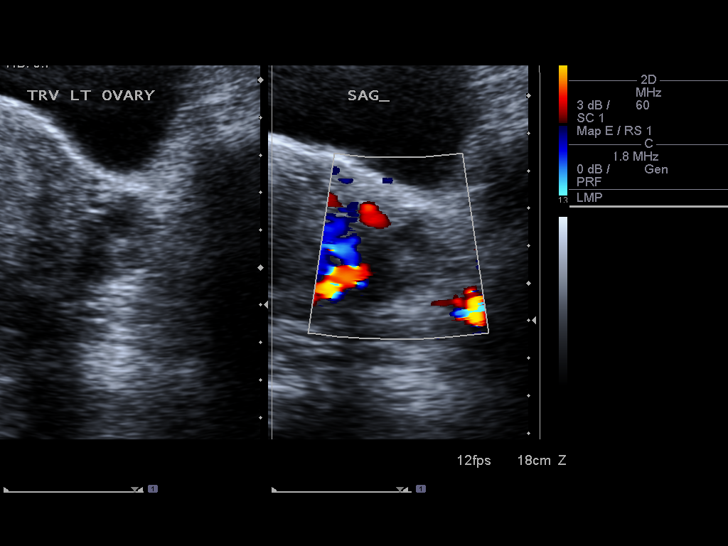
[im 20/53]
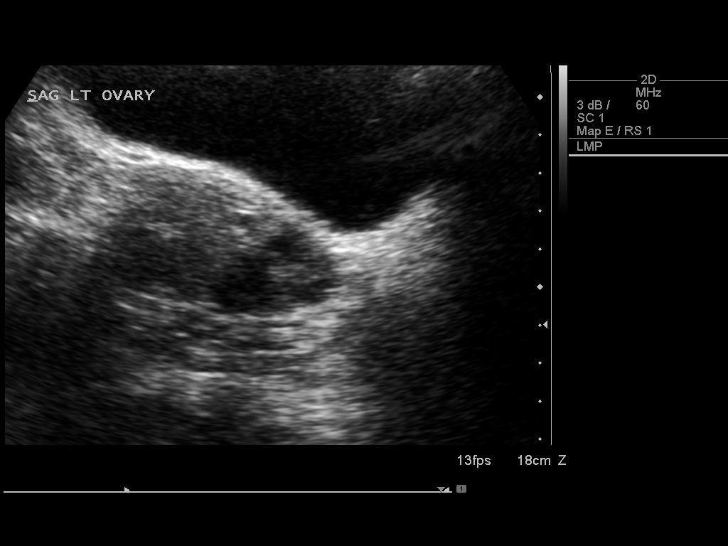
[im 24/53]
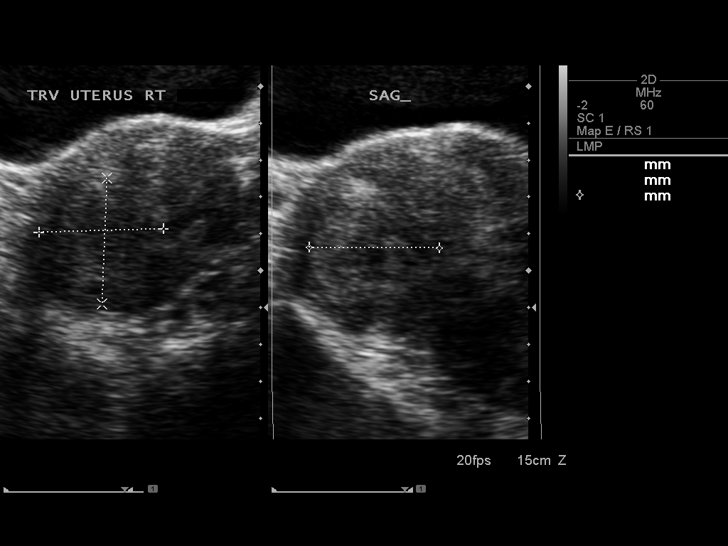
[im 29/53]
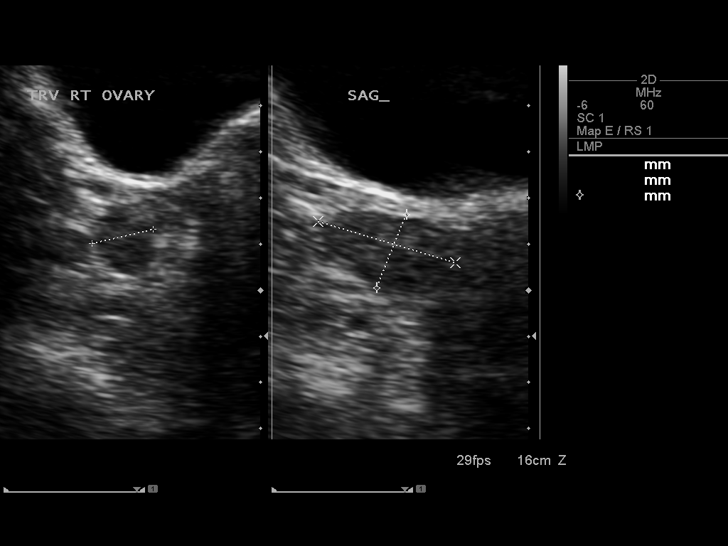
[im 33/53]
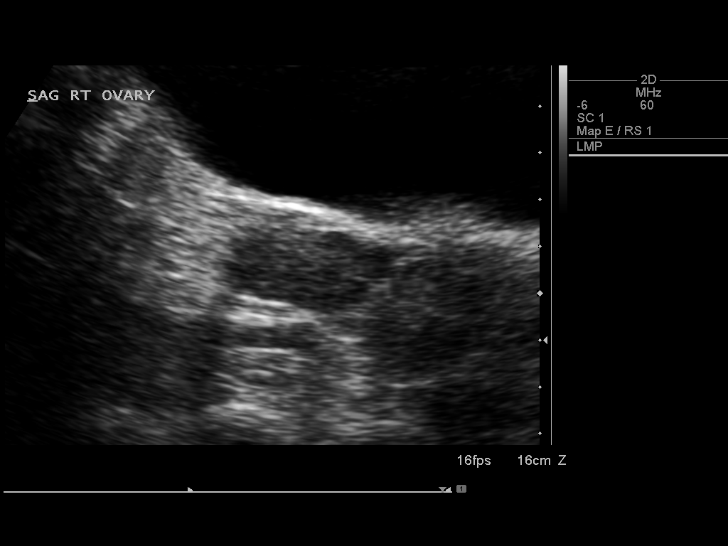
[im 35/53]
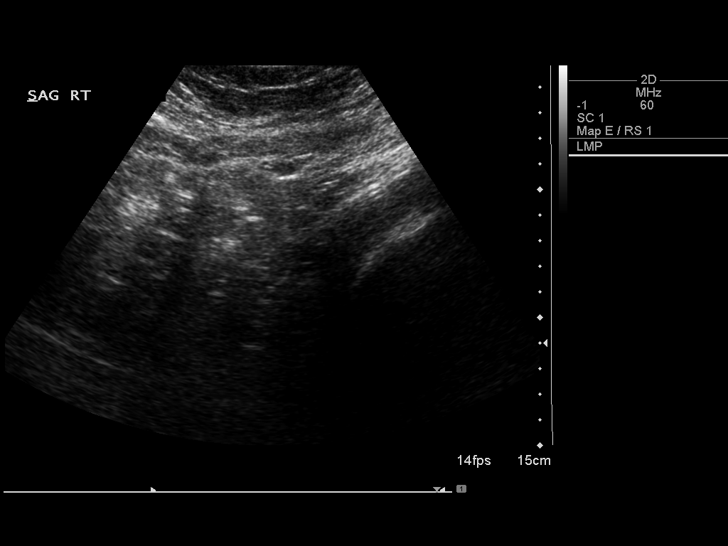
[im 40/53]
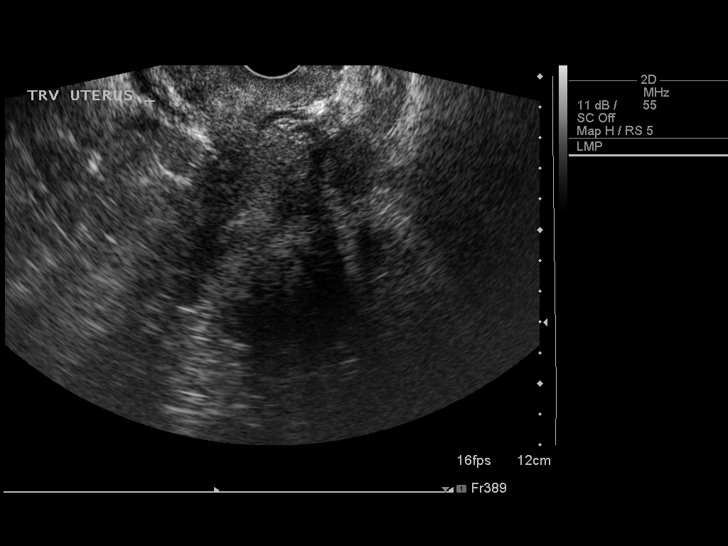
[im 44/53]
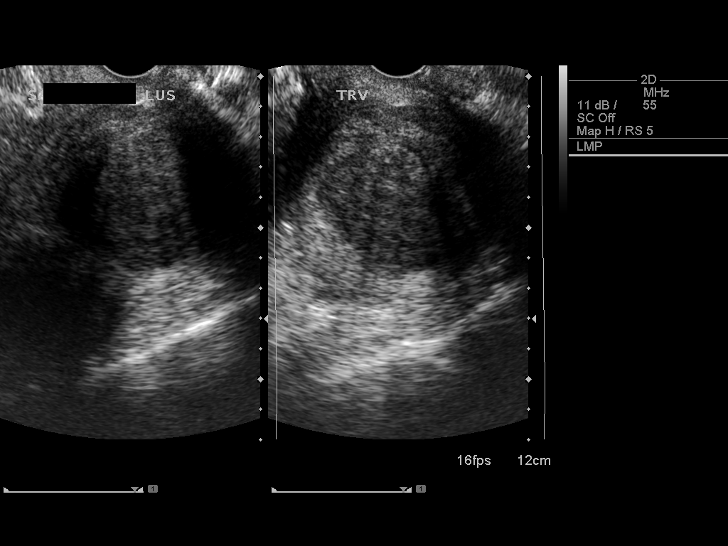
[im 48/53]
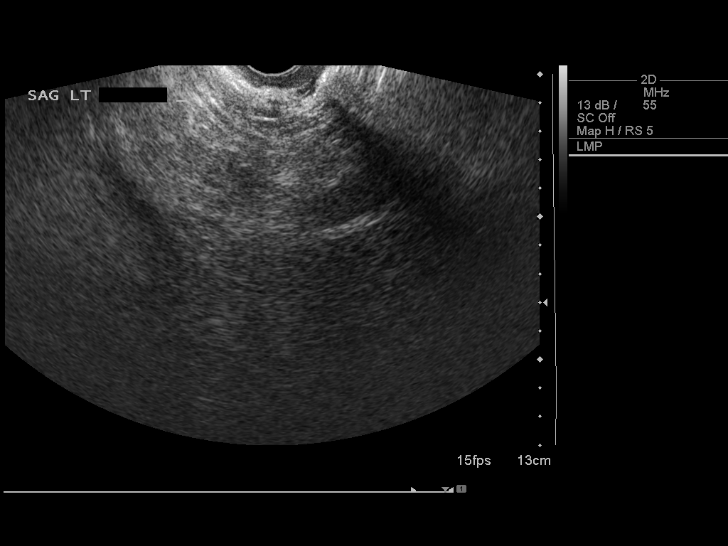
[im 53/53]
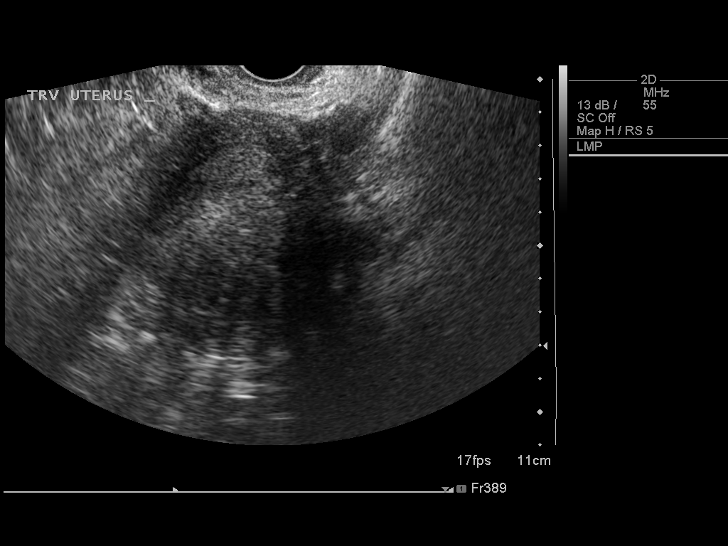

[14 of 25 positions shown; findings below may reference images not displayed]

FINDINGS: Uterus

Measurements: 12.0 x 5.4 x 5.1 cm. There is a subserosal leiomyoma
in the rightward mid uterus region measuring 6.4 x 6.3 x 6.0 cm.
There is an intramural leiomyoma in the right superior fundal region
measuring 3.5 x 3.4 x 3.4 cm. There is a leiomyoma in the leftward
aspect of the superior fundus measuring 2.7 x 3.2 x 2.7 cm

Endometrium

Thickness: 9 mm..  No focal abnormality visualized.

Right ovary

Measurements: 3.1 x 1.7 x 1.4 cm. Normal appearance/no adnexal mass.

Left ovary

Measurements: 3.0 x 2.0 x 2.7 cm. Normal appearance/no adnexal mass.

Other findings

No abnormal free fluid.
IMPRESSION: Prominent uterus with several prominent leiomyomas. Study otherwise
unremarkable.

## 2017-01-30 ENCOUNTER — Ambulatory Visit: Payer: PRIVATE HEALTH INSURANCE | Admitting: Obstetrics and Gynecology

## 2017-01-30 ENCOUNTER — Encounter: Payer: BLUE CROSS/BLUE SHIELD | Admitting: Family Medicine

## 2017-02-05 ENCOUNTER — Ambulatory Visit: Payer: PRIVATE HEALTH INSURANCE | Admitting: Obstetrics and Gynecology

## 2017-02-06 ENCOUNTER — Ambulatory Visit (INDEPENDENT_AMBULATORY_CARE_PROVIDER_SITE_OTHER): Payer: PRIVATE HEALTH INSURANCE | Admitting: Obstetrics and Gynecology

## 2017-02-06 ENCOUNTER — Encounter: Payer: Self-pay | Admitting: Obstetrics and Gynecology

## 2017-02-06 VITALS — BP 132/80 | HR 80 | Resp 16 | Wt 227.0 lb

## 2017-02-06 DIAGNOSIS — Z3041 Encounter for surveillance of contraceptive pills: Secondary | ICD-10-CM

## 2017-02-06 DIAGNOSIS — N920 Excessive and frequent menstruation with regular cycle: Secondary | ICD-10-CM | POA: Diagnosis not present

## 2017-02-06 DIAGNOSIS — N949 Unspecified condition associated with female genital organs and menstrual cycle: Secondary | ICD-10-CM

## 2017-02-06 DIAGNOSIS — N946 Dysmenorrhea, unspecified: Secondary | ICD-10-CM

## 2017-02-06 DIAGNOSIS — D259 Leiomyoma of uterus, unspecified: Secondary | ICD-10-CM

## 2017-02-06 DIAGNOSIS — N941 Unspecified dyspareunia: Secondary | ICD-10-CM

## 2017-02-06 DIAGNOSIS — M6289 Other specified disorders of muscle: Secondary | ICD-10-CM

## 2017-02-06 MED ORDER — LIDOCAINE 5 % EX OINT: TOPICAL_OINTMENT | CUTANEOUS | 1 refills | Status: DC

## 2017-02-06 MED ORDER — NORETHIN ACE-ETH ESTRAD-FE 1-20 MG-MCG(24) PO TABS: 1.0000 | ORAL_TABLET | Freq: Every day | ORAL | 2 refills | Status: DC

## 2017-02-06 MED ORDER — DOXYCYCLINE HYCLATE 100 MG PO CAPS: 100.0000 mg | ORAL_CAPSULE | Freq: Two times a day (BID) | ORAL | 0 refills | Status: DC

## 2017-02-06 NOTE — Progress Notes (Signed)
GYNECOLOGY  VISIT   HPI: 36 y.o.   Single  African American  female   [place] with Patient's last menstrual period was 01/19/2017.   here for follow up OCP. The patient was switched to micronor to combination OCP's in 5/18 after weight loss and resolution of her HTN. She has a fibroid uterus and a h/o menorrhagia. Patient c/o pain with intercourse   On OCP's menses are q month x 10 days, some BTB spotting. She saturates a super tampon in up to an hour (improved from every 30 minutes). Bleeds heavily for 5 days. She continues to have bad cramps (no change), helped with anaprox.  Sexually active again just in the last month, She is with the same long term partner (8 years together). She has deep dyspareunia every time, sometimes has entry dyspareunia. Needs to stop secondary to the pain. After sex she has cramping in her pelvis. It has been 6 years since she was sexually active with her partner, no dyspareunia in the distant past. Never pleasurable. Not able to have orgasms by herself or with her partner. Her partner feels like her vagina is very tight. The condom pulled off last week, he felt like her vagina contracted.   GYNECOLOGIC HISTORY: Patient's last menstrual period was 01/19/2017. Contraception:OCP Menopausal hormone therapy: none         OB History    Gravida Para Term Preterm AB Living   0 0 0 0 0 0   SAB TAB Ectopic Multiple Live Births   0 0 0 0 0         Patient Active Problem List   Diagnosis Date Noted  . Anxiety disorder, unspecified 02/21/2016  . Type 2 diabetes mellitus ([place]) 05/02/2012  . BMI 40.0-44.9, adult ([place]) 05/02/2012  . Allergic rhinitis 05/02/2012    Past Medical History:  Diagnosis Date  . Abnormal uterine bleeding   . Anxiety   . Depression   . Diabetes mellitus without complication ([place])   . Fibroid   . Hypertension     No past surgical history on file.  Current Outpatient Prescriptions  Medication Sig Dispense Refill  . Melatonin ER 5 MG  TBCR Take 5 mg by mouth at bedtime. 90 tablet 3  . naproxen sodium (ANAPROX DS) 550 MG tablet Take 1 tablet (550 mg total) by mouth 2 (two) times daily with a meal. 30 tablet 2  . Norethindrone Acetate-Ethinyl Estrad-FE (LOESTRIN 24 FE) 1-20 MG-MCG(24) tablet Take 1 tablet by mouth daily. 3 Package 0   No current facility-administered medications for this visit.      ALLERGIES: Shellfish allergy  Family History  Problem Relation Age of Onset  . Diabetes Mother   . Hypertension Mother   . Hypertension Father   . Diabetes Sister   . Diabetes Maternal Aunt   . Diabetes Maternal Uncle   . Heart disease Maternal Grandmother   . Diabetes Maternal Grandmother   . Stroke Maternal Grandmother     Social History   Social History  . Marital status: Single    Spouse name: N/A  . Number of children: N/A  . Years of education: N/A   Occupational History  . Not on file.   Social History Main Topics  . Smoking status: Never Smoker  . Smokeless tobacco: Never Used  . Alcohol use Yes     Comment: occ  . Drug use: No  . Sexual activity: No   Other Topics Concern  . Not on file  Social History Narrative  . No narrative on file    Review of Systems  Constitutional: Negative.   HENT: Negative.   Eyes: Negative.   Respiratory: Negative.   Cardiovascular: Negative.   Gastrointestinal: Negative.   Genitourinary:       Pain with intercourse  Musculoskeletal: Negative.   Skin: Negative.   Neurological: Negative.   Endo/Heme/Allergies: Negative.   Psychiatric/Behavioral: Negative.     PHYSICAL EXAMINATION:    BP 132/80 (BP Location: Right Arm, Patient Position: Sitting, Cuff Size: Normal)   Pulse 80   Resp 16   Wt 227 lb (103 kg)   LMP 01/19/2017   BMI 37.49 kg/m     General appearance: alert, cooperative and appears stated age Abdomen: soft, mildly tender suprapubic region over fibroid uterus, no rebound, no guarding ;not distended; no masses,  no  organomegaly  Pelvic: External genitalia:  no lesions, point tenderness in the posterior vestibule               Urethra:  normal appearing urethra with no masses, tenderness or lesions              Bartholins and Skenes: normal                 Vagina: normal appearing vagina with normal color and discharge, no lesions              Cervix: no cervical motion tenderness and no lesions              Bimanual Exam:  Uterus:  retroverted, tender, 12 week sized.              Adnexa: no mass, fullness, tenderness   Pelvic floor: tender on the left               Chaperone was present for exam.  ASSESSMENT Menorrhagia, improved on OCP's (not anemic previously). BP is okay on OCP's Fibroid uterus, stable in size Uterine tenderness, possible endometritis Dyspareunia, deep and entry Pelvic floor tender on the left Posterior vestibular tenderness Dysmenorrhea, tolerable with Anaprox DS Orgasmic dysfunction, she has reading suggestions. 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 Also discussed fertility. She would like to have a baby in the next few years. Encouraged her to discuss this further with her partner. I would encourage her to get pregnant sooner than later. Discussed AMA and concerns with fertility, SABS and worsening maternal illnesses in pregnancy with advancing age. This is separate from the risks with her fibroid uterus   An After Visit Summary was printed and given to the patient.

## 2017-02-19 ENCOUNTER — Telehealth: Payer: Self-pay | Admitting: Obstetrics and Gynecology

## 2017-02-19 NOTE — Telephone Encounter (Signed)
Patient was scheduled for a 2 week follow up on 02/20/17.  Patient states she needs to reschedule to October, moved her to 03/12/17 @ 1:15pm

## 2017-02-19 NOTE — Progress Notes (Deleted)
HPI:   [name] is a 36 y.o. female, who is here today for her routine physical.  Last CPE *** She follows with gyn regularly, Dr Talbert Nan. Last pap smear 03/2016.  Regular exercise 3 or more time per week: *** Following a healthy diet: *** She lives with ***  Last  Chronic medical problems: DM II  Lab Results  Component Value Date   HGBA1C 7.1 (H) 09/20/2016   Lab Results  Component Value Date   CHOL 151 02/05/2014   HDL 40.90 02/05/2014   LDLCALC 99 02/05/2014   TRIG 58.0 02/05/2014   CHOLHDL 4 02/05/2014    She has *** concerns today.    Review of Systems    Current Outpatient Prescriptions on File Prior to Visit  Medication Sig Dispense Refill  . doxycycline (VIBRAMYCIN) 100 MG capsule Take 1 capsule (100 mg total) by mouth 2 (two) times daily. Take BID for 10 days.  Take with food as can cause GI distress. 20 capsule 0  . lidocaine (XYLOCAINE) 5 % ointment Apply a pea sized amount topically 20 minutes prior to intercourse, wipe off just prior to intercourse 30 g 1  . Melatonin ER 5 MG TBCR Take 5 mg by mouth at bedtime. 90 tablet 3  . naproxen sodium (ANAPROX DS) 550 MG tablet Take 1 tablet (550 mg total) by mouth 2 (two) times daily with a meal. 30 tablet 2  . Norethindrone Acetate-Ethinyl Estrad-FE (LOESTRIN 24 FE) 1-20 MG-MCG(24) tablet Take 1 tablet by mouth daily. 3 Package 2   No current facility-administered medications on file prior to visit.      Past Medical History:  Diagnosis Date  . Abnormal uterine bleeding   . Anxiety   . Depression   . Diabetes mellitus without complication ([place])   . Fibroid   . Hypertension     Allergies  Allergen Reactions  . Shellfish Allergy Itching, Nausea And Vomiting and Swelling    Family History  Problem Relation Age of Onset  . Diabetes Mother   . Hypertension Mother   . Hypertension Father   . Diabetes Sister   . Diabetes Maternal Aunt   . Diabetes Maternal Uncle   . Heart  disease Maternal Grandmother   . Diabetes Maternal Grandmother   . Stroke Maternal Grandmother     Social History   Social History  . Marital status: Single    Spouse name: N/A  . Number of children: N/A  . Years of education: N/A   Social History Main Topics  . Smoking status: Never Smoker  . Smokeless tobacco: Never Used  . Alcohol use Yes     Comment: occ  . Drug use: No  . Sexual activity: No   Other Topics Concern  . Not on file   Social History Narrative  . No narrative on file     There were no vitals filed for this visit. There is no height or weight on file to calculate BMI.  @LASTSAO2 (3)@  Wt Readings from Last 3 Encounters:  02/06/17 227 lb (103 kg)  10/09/16 229 lb (103.9 kg)  09/20/16 229 lb (103.9 kg)          Physical Exam    ASSESSMENT AND PLAN:      There are no diagnoses linked to this encounter.           No Follow-up on file.          Marce Schartz G. [place], MD  [place]. [place] office.

## 2017-02-20 ENCOUNTER — Ambulatory Visit: Payer: PRIVATE HEALTH INSURANCE | Admitting: Obstetrics and Gynecology

## 2017-02-20 ENCOUNTER — Encounter: Payer: BLUE CROSS/BLUE SHIELD | Admitting: Family Medicine

## 2017-02-20 DIAGNOSIS — Z0289 Encounter for other administrative examinations: Secondary | ICD-10-CM

## 2017-02-26 ENCOUNTER — Ambulatory Visit: Payer: PRIVATE HEALTH INSURANCE | Admitting: Obstetrics and Gynecology

## 2017-02-28 ENCOUNTER — Encounter: Payer: Self-pay | Admitting: Family Medicine

## 2017-03-07 NOTE — Telephone Encounter (Signed)
Routing to Dr. Talbert Nan as Juluis Rainier.   Encounter closed.

## 2017-03-12 ENCOUNTER — Ambulatory Visit: Payer: PRIVATE HEALTH INSURANCE | Admitting: Obstetrics and Gynecology

## 2017-03-22 ENCOUNTER — Telehealth: Payer: Self-pay | Admitting: Obstetrics and Gynecology

## 2017-03-22 NOTE — Telephone Encounter (Signed)
Patient called and cancelled her upcoming appointment on 03/26/17 with Dr. Talbert Nan for: Return in about 2 weeks (around 02/20/2017) for f/u possible endometritis. She said she'll have to call back and reschedule at a later date.  Routing to provider for FYI.

## 2017-03-26 ENCOUNTER — Ambulatory Visit: Payer: PRIVATE HEALTH INSURANCE | Admitting: Obstetrics and Gynecology

## 2017-04-02 NOTE — Telephone Encounter (Signed)
Called and left a message to call back and reschedule the cancelled appointment.

## 2017-05-14 NOTE — Telephone Encounter (Signed)
Patient has not returned calls to reschedule her cancelled appointment on 03/26/17 to "recheck in about 2 weeks (around 02/20/2017) for f/u possible endometritis." Routing to provider for FYI.

## 2018-04-10 ENCOUNTER — Ambulatory Visit: Payer: PRIVATE HEALTH INSURANCE | Admitting: Obstetrics and Gynecology

## 2018-05-01 ENCOUNTER — Ambulatory Visit: Payer: PRIVATE HEALTH INSURANCE | Admitting: Obstetrics and Gynecology

## 2018-05-16 ENCOUNTER — Ambulatory Visit: Payer: PRIVATE HEALTH INSURANCE | Admitting: Obstetrics and Gynecology

## 2018-05-28 ENCOUNTER — Ambulatory Visit: Payer: PRIVATE HEALTH INSURANCE | Admitting: Obstetrics and Gynecology

## 2018-06-09 NOTE — Progress Notes (Signed)
37 y.o. G0P0000 Single Black or African American Not Hispanic or Latino female here for annual exam.   She has a known fibroid uterus and a h/o menorrhagia (without anemia). She was previously treated for OCP's, when not on medication for hypertension.  She also has a long h/o dyspareunia, on prior exam she has vestibular, pelvic floor and uterine tenderness. Was treated for a possible endometritis in 2018 and given lidocaine ointment, she didn't return for f/u until now. We did discuss pelvic floor PT at that time. She did feel like the antibiotics helped. She is sexually active, now only has intermittent deep dyspareunia. No longer with entry pain. Position change helps the pain. Considering attempting pregnancy next year. Understands she needs to get her health under control first.   She has moved to [place] and has a primary there. DM not in control, HgbA1C was a 10. She is spilling protein from her kidneys, cholesterol was high.   Period Cycle (Days): 28 Period Duration (Days): week and a half Period Pattern: Regular Menstrual Flow: Heavy Menstrual Control: Tampon, Thin pad Menstrual Control Change Freq (Hours): changes tampon/pad every hour Dysmenorrhea: (!) Moderate Dysmenorrhea Symptoms: Cramping  She is wearing an overnight pad and a super tampon and changes her tampon every hour. Heavy for a week, then spots for another 1/2 a week. Cramps are tolerable.  She has had some intermittent aching in her pelvis for the last month. She is having intermittent sharp pain in her left pelvis for the last month. The aching pain is a 2/10 and he sharp pain is a 5/10. Both pains last for a few seconds.   Patient's last menstrual period was 05/28/2018 (exact date).          Sexually active: Yes.    The current method of family planning is condoms most of the time.    Exercising: No.  The patient does not participate in regular exercise at present. Smoker:  no  Health Maintenance: Pap:   03/12/2016 WNL with NEG HR HPV History of abnormal Pap:  no MMG:  03-25-13 WNL- start routine screening at age 27 Colonoscopy:  Never BMD:   Never TDaP:  11-19-11 Gardasil: No, discussed.    reports that she has never smoked. She has never used smokeless tobacco. She reports previous alcohol use. She reports that she does not use drugs. She works at [employer] in [place]. She lives with her boyfriend, just bought a home last year.   Past Medical History:  Diagnosis Date  . Abnormal uterine bleeding   . Anxiety   . Depression   . Diabetes mellitus without complication ([place])   . Fibroid   . Hypertension   She still has anxiety, working through it. Depression is better with a job change.   History reviewed. No pertinent surgical history.  Current Outpatient Medications  Medication Sig Dispense Refill  . atorvastatin (LIPITOR) 10 MG tablet Take 10 mg by mouth daily.    Marland Kitchen glipiZIDE (GLUCOTROL) 5 MG tablet Take by mouth daily before breakfast.    . lisinopril (PRINIVIL,ZESTRIL) 10 MG tablet Take 10 mg by mouth daily.    . metFORMIN (GLUCOPHAGE) 500 MG tablet Take by mouth 3 (three) times daily.     No current facility-administered medications for this visit.     Family History  Problem Relation Age of Onset  . Diabetes Mother   . Hypertension Mother   . Hypertension Father   . Diabetes Sister   . Diabetes Maternal Aunt   .  Diabetes Maternal Uncle   . Heart disease Maternal Grandmother   . Diabetes Maternal Grandmother   . Stroke Maternal Grandmother     Review of Systems  Constitutional: Negative.   HENT: Negative.   Eyes: Negative.   Respiratory: Negative.   Cardiovascular: Negative.   Gastrointestinal:       Bloating  Endocrine: Negative.   Genitourinary: Positive for pelvic pain.       Pain with intercourse  Musculoskeletal: Negative.   Skin: Negative.   Allergic/Immunologic: Negative.   Neurological: Negative.   Hematological: Negative.     Exam:   BP 128/80  (BP Location: Right Arm, Patient Position: Sitting, Cuff Size: Normal)   Pulse 68   Ht 5\' 5"  (1.651 m)   Wt 253 lb (114.8 kg)   LMP 05/28/2018 (Exact Date)   BMI 42.10 kg/m   Weight change: @WEIGHTCHANGE @ Height:   Height: 5\' 5"  (165.1 cm)  Ht Readings from Last 3 Encounters:  06/12/18 5\' 5"  (1.651 m)  02/27/16 5' 5.25" (1.657 m)  02/21/16 5\' 4"  (1.626 m)    General appearance: alert, cooperative and appears stated age Head: Normocephalic, without obvious abnormality, atraumatic Neck: no adenopathy, supple, symmetrical, trachea midline and thyroid normal to inspection and palpation Lungs: clear to auscultation bilaterally Cardiovascular: regular rate and rhythm Breasts: normal appearance, no masses or tenderness Abdomen: soft, mildly tender bilateral lower quadrant; non distended,  no masses,  no organomegaly Extremities: extremities normal, atraumatic, no cyanosis or edema Skin: Skin color, texture, turgor normal. No rashes or lesions Lymph nodes: Cervical, supraclavicular, and axillary nodes normal. No abnormal inguinal nodes palpated Neurologic: Grossly normal   Pelvic: External genitalia:  no lesions              Urethra:  normal appearing urethra with no masses, tenderness or lesions              Bartholins and Skenes: normal                 Vagina: normal appearing vagina with normal color and discharge, no lesions              Cervix: no lesions and mildly tender on entire pelvic exam, including bilateral pelvic floor               Bimanual Exam:  Uterus:  16 week sized, tender, mobile              Adnexa: tender on entire pelvic exam, including pelvic floor.                Rectovaginal: Confirms               Anus:  normal sphincter tone, no lesions  Chaperone was present for exam.  A:  Well Woman with normal exam  Fibroid uterus, enlarging in size  Menorrhagia  Pelvic pain, diffusely tender on exam. Doubt PID, but will treat to be safe case.  Dyspareunia still  present, but has improved    P:   CBC, Ferritin, TSH  Pap with hpv, GC, CT  Other labs with primary  Will treat with Ceftriaxone and Doxycycline  Call with worsening pain, fever or any other concerns  Return for gyn ultrasound and endometrial biopsy  Discussed breast self exam  Condoms for contraception

## 2018-06-12 ENCOUNTER — Telehealth: Payer: Self-pay | Admitting: Obstetrics and Gynecology

## 2018-06-12 ENCOUNTER — Other Ambulatory Visit: Payer: Self-pay

## 2018-06-12 ENCOUNTER — Ambulatory Visit (INDEPENDENT_AMBULATORY_CARE_PROVIDER_SITE_OTHER): Payer: 59 | Admitting: Obstetrics and Gynecology

## 2018-06-12 ENCOUNTER — Encounter: Payer: Self-pay | Admitting: Obstetrics and Gynecology

## 2018-06-12 ENCOUNTER — Other Ambulatory Visit (HOSPITAL_COMMUNITY)
Admission: RE | Admit: 2018-06-12 | Discharge: 2018-06-12 | Disposition: A | Discharge status: Home / Self Care | Payer: 59 | Source: Ambulatory Visit | Attending: Obstetrics and Gynecology | Admitting: Obstetrics and Gynecology

## 2018-06-12 VITALS — BP 128/80 | HR 68 | Ht 65.0 in | Wt 253.0 lb

## 2018-06-12 DIAGNOSIS — R102 Pelvic and perineal pain: Secondary | ICD-10-CM | POA: Diagnosis not present

## 2018-06-12 DIAGNOSIS — Z124 Encounter for screening for malignant neoplasm of cervix: Secondary | ICD-10-CM | POA: Insufficient documentation

## 2018-06-12 DIAGNOSIS — N920 Excessive and frequent menstruation with regular cycle: Secondary | ICD-10-CM | POA: Diagnosis not present

## 2018-06-12 DIAGNOSIS — Z01419 Encounter for gynecological examination (general) (routine) without abnormal findings: Secondary | ICD-10-CM | POA: Diagnosis not present

## 2018-06-12 DIAGNOSIS — D259 Leiomyoma of uterus, unspecified: Secondary | ICD-10-CM | POA: Diagnosis not present

## 2018-06-12 MED ORDER — CEFTRIAXONE SODIUM 250 MG IJ SOLR
250.0000 mg | Freq: Once | INTRAMUSCULAR | Status: AC
  Administered 2018-06-12: 250 mg via INTRAMUSCULAR

## 2018-06-12 MED ORDER — DOXYCYCLINE HYCLATE 100 MG PO CAPS: 100.0000 mg | ORAL_CAPSULE | Freq: Two times a day (BID) | ORAL | 0 refills | Status: DC

## 2018-06-12 NOTE — Patient Instructions (Signed)
EXERCISE AND DIET:  We recommended that you start or continue a regular exercise program for good health. Regular exercise means any activity that makes your heart beat faster and makes you sweat.  We recommend exercising at least 30 minutes per day at least 3 days a week, preferably 4 or 5.  We also recommend a diet low in fat and sugar.  Inactivity, poor dietary choices and obesity can cause diabetes, heart attack, stroke, and kidney damage, among others.    ALCOHOL AND SMOKING:  Women should limit their alcohol intake to no more than 7 drinks/beers/glasses of wine (combined, not each!) per week. Moderation of alcohol intake to this level decreases your risk of breast cancer and liver damage. And of course, no recreational drugs are part of a healthy lifestyle.  And absolutely no smoking or even second hand smoke. Most people know smoking can cause heart and lung diseases, but did you know it also contributes to weakening of your bones? Aging of your skin?  Yellowing of your teeth and nails?  CALCIUM AND VITAMIN D:  Adequate intake of calcium and Vitamin D are recommended.  The recommendations for exact amounts of these supplements seem to change often, but generally speaking 1,000 mg of calcium (between diet and supplement) and 800 units of Vitamin D per day seems prudent. Certain women may benefit from higher intake of Vitamin D.  If you are among these women, your doctor will have told you during your visit.    PAP SMEARS:  Pap smears, to check for cervical cancer or precancers,  have traditionally been done yearly, although recent scientific advances have shown that most women can have pap smears less often.  However, every woman still should have a physical exam from her gynecologist every year. It will include a breast check, inspection of the vulva and vagina to check for abnormal growths or skin changes, a visual exam of the cervix, and then an exam to evaluate the size and shape of the uterus and  ovaries.  And after 37 years of age, a rectal exam is indicated to check for rectal cancers. We will also provide age appropriate advice regarding health maintenance, like when you should have certain vaccines, screening for sexually transmitted diseases, bone density testing, colonoscopy, mammograms, etc.   MAMMOGRAMS:  All women over 40 years old should have a yearly mammogram. Many facilities now offer a "3D" mammogram, which may cost around $50 extra out of pocket. If possible,  we recommend you accept the option to have the 3D mammogram performed.  It both reduces the number of women who will be called back for extra views which then turn out to be normal, and it is better than the routine mammogram at detecting truly abnormal areas.    COLON CANCER SCREENING: Now recommend starting at age 45. At this time colonoscopy is not covered for routine screening until 50. There are take home tests that can be done between 45-49.   COLONOSCOPY:  Colonoscopy to screen for colon cancer is recommended for all women at age 50.  We know, you hate the idea of the prep.  We agree, BUT, having colon cancer and not knowing it is worse!!  Colon cancer so often starts as a polyp that can be seen and removed at colonscopy, which can quite literally save your life!  And if your first colonoscopy is normal and you have no family history of colon cancer, most women don't have to have it again for   10 years.  Once every ten years, you can do something that may end up saving your life, right?  We will be happy to help you get it scheduled when you are ready.  Be sure to check your insurance coverage so you understand how much it will cost.  It may be covered as a preventative service at no cost, but you should check your particular policy.      Breast Self-Awareness Breast self-awareness means being familiar with how your breasts look and feel. It involves checking your breasts regularly and reporting any changes to your  health care provider. Practicing breast self-awareness is important. A change in your breasts can be a sign of a serious medical problem. Being familiar with how your breasts look and feel allows you to find any problems early, when treatment is more likely to be successful. All women should practice breast self-awareness, including women who have had breast implants. How to do a breast self-exam One way to learn what is normal for your breasts and whether your breasts are changing is to do a breast self-exam. To do a breast self-exam: Look for Changes  1. Remove all the clothing above your waist. 2. Stand in front of a mirror in a room with good lighting. 3. Put your hands on your hips. 4. Push your hands firmly downward. 5. Compare your breasts in the mirror. Look for differences between them (asymmetry), such as: ? Differences in shape. ? Differences in size. ? Puckers, dips, and bumps in one breast and not the other. 6. Look at each breast for changes in your skin, such as: ? Redness. ? Scaly areas. 7. Look for changes in your nipples, such as: ? Discharge. ? Bleeding. ? Dimpling. ? Redness. ? A change in position. Feel for Changes Carefully feel your breasts for lumps and changes. It is best to do this while lying on your back on the floor and again while sitting or standing in the shower or tub with soapy water on your skin. Feel each breast in the following way:  Place the arm on the side of the breast you are examining above your head.  Feel your breast with the other hand.  Start in the nipple area and make  inch (2 cm) overlapping circles to feel your breast. Use the pads of your three middle fingers to do this. Apply light pressure, then medium pressure, then firm pressure. The light pressure will allow you to feel the tissue closest to the skin. The medium pressure will allow you to feel the tissue that is a little deeper. The firm pressure will allow you to feel the tissue  close to the ribs.  Continue the overlapping circles, moving downward over the breast until you feel your ribs below your breast.  Move one finger-width toward the center of the body. Continue to use the  inch (2 cm) overlapping circles to feel your breast as you move slowly up toward your collarbone.  Continue the up and down exam using all three pressures until you reach your armpit.  Write Down What You Find  Write down what is normal for each breast and any changes that you find. Keep a written record with breast changes or normal findings for each breast. By writing this information down, you do not need to depend only on memory for size, tenderness, or location. Write down where you are in your menstrual cycle, if you are still menstruating. If you are having trouble noticing differences   in your breasts, do not get discouraged. With time you will become more familiar with the variations in your breasts and more comfortable with the exam. How often should I examine my breasts? Examine your breasts every month. If you are breastfeeding, the best time to examine your breasts is after a feeding or after using a breast pump. If you menstruate, the best time to examine your breasts is 5-7 days after your period is over. During your period, your breasts are lumpier, and it may be more difficult to notice changes. When should I see my health care provider? See your health care provider if you notice:  A change in shape or size of your breasts or nipples.  A change in the skin of your breast or nipples, such as a reddened or scaly area.  Unusual discharge from your nipples.  A lump or thick area that was not there before.  Pain in your breasts.  Anything that concerns you.  

## 2018-06-12 NOTE — Telephone Encounter (Signed)
Spoke with patient regarding benefit for an ultrasound and endometrial biopsy. Patient understood and agreeable. Patient ready to schedule. At patient's request, scheduled 07/08/2018 with Dr Talbert Nan. Patient aware of appointment date, arrival time and cancellation policy.   Forwarding to Dr Talbert Nan for final review. Patient is agreeable to disposition. Will close encounter

## 2018-06-15 LAB — CBC
HEMATOCRIT: 41.5 % (ref 34.0–46.6)
HEMOGLOBIN: 13.6 g/dL (ref 11.1–15.9)
MCH: 24.7 pg — ABNORMAL LOW (ref 26.6–33.0)
MCHC: 32.8 g/dL (ref 31.5–35.7)
MCV: 75 fL — ABNORMAL LOW (ref 79–97)
Platelets: 301 10*3/uL (ref 150–450)
RBC: 5.51 x10E6/uL — ABNORMAL HIGH (ref 3.77–5.28)
RDW: 14.4 % (ref 12.3–15.4)
WBC: 9 10*3/uL (ref 3.4–10.8)

## 2018-06-15 LAB — TSH: TSH: 2.38 u[IU]/mL (ref 0.450–4.500)

## 2018-06-15 LAB — FERRITIN: Ferritin: 37 ng/mL (ref 15–150)

## 2018-06-16 LAB — CYTOLOGY - PAP
CHLAMYDIA, DNA PROBE: NEGATIVE
DIAGNOSIS: NEGATIVE
HPV: NOT DETECTED
NEISSERIA GONORRHEA: NEGATIVE

## 2018-07-01 ENCOUNTER — Other Ambulatory Visit: Payer: 59

## 2018-07-03 ENCOUNTER — Telehealth: Payer: Self-pay | Admitting: Obstetrics and Gynecology

## 2018-07-03 NOTE — Telephone Encounter (Signed)
Spoke with patient, requesting to reschedule PUS & EMB. PUS & EMB for enlarging fibroid uterus, menorrhagia, pelvic pain. Rescheduled for PUS & EMB on 2/11 at 12:30pm, consult to follow at 1pm with Dr. Talbert Nan. Patient declined earlier appt. Advised Dr. Talbert Nan will review, I will return call if any additional recommendations.   Routing to provider for final review. Patient is agreeable to disposition. Will close encounter.  Cc: Magdalene Patricia

## 2018-07-03 NOTE — Telephone Encounter (Signed)
Patient cancelled 07/08/18 ultrasound and would like to reschedule.

## 2018-07-08 ENCOUNTER — Other Ambulatory Visit: Payer: 59

## 2018-07-08 ENCOUNTER — Telehealth: Payer: Self-pay | Admitting: Obstetrics and Gynecology

## 2018-07-08 ENCOUNTER — Other Ambulatory Visit: Payer: 59 | Admitting: Obstetrics and Gynecology

## 2018-07-08 NOTE — Telephone Encounter (Signed)
Can you put in a order for ultrasound.? She is scheduled for 07/22/18

## 2018-07-08 NOTE — Telephone Encounter (Signed)
Current orders extended and linked to appointment for 07/22/18.  Will close.

## 2018-07-22 ENCOUNTER — Ambulatory Visit (INDEPENDENT_AMBULATORY_CARE_PROVIDER_SITE_OTHER): Payer: 59

## 2018-07-22 ENCOUNTER — Other Ambulatory Visit: Payer: Self-pay

## 2018-07-22 ENCOUNTER — Ambulatory Visit (INDEPENDENT_AMBULATORY_CARE_PROVIDER_SITE_OTHER): Payer: 59 | Admitting: Obstetrics and Gynecology

## 2018-07-22 ENCOUNTER — Encounter: Payer: Self-pay | Admitting: Obstetrics and Gynecology

## 2018-07-22 ENCOUNTER — Other Ambulatory Visit: Payer: Self-pay | Admitting: Obstetrics and Gynecology

## 2018-07-22 ENCOUNTER — Telehealth: Payer: Self-pay

## 2018-07-22 VITALS — BP 132/86 | HR 80 | Wt 253.0 lb

## 2018-07-22 DIAGNOSIS — Z01812 Encounter for preprocedural laboratory examination: Secondary | ICD-10-CM | POA: Diagnosis not present

## 2018-07-22 DIAGNOSIS — Z8639 Personal history of other endocrine, nutritional and metabolic disease: Secondary | ICD-10-CM

## 2018-07-22 DIAGNOSIS — R102 Pelvic and perineal pain: Secondary | ICD-10-CM

## 2018-07-22 DIAGNOSIS — I10 Essential (primary) hypertension: Secondary | ICD-10-CM

## 2018-07-22 DIAGNOSIS — Z6841 Body Mass Index (BMI) 40.0 and over, adult: Secondary | ICD-10-CM

## 2018-07-22 DIAGNOSIS — D259 Leiomyoma of uterus, unspecified: Secondary | ICD-10-CM

## 2018-07-22 DIAGNOSIS — N920 Excessive and frequent menstruation with regular cycle: Secondary | ICD-10-CM

## 2018-07-22 LAB — POCT URINE PREGNANCY: PREG TEST UR: NEGATIVE

## 2018-07-22 NOTE — Progress Notes (Signed)
GYNECOLOGY  VISIT   HPI: 38 y.o.   Single Black or African American Not Hispanic or Latino  female   G0P0000 with Patient's last menstrual period was 07/21/2018 (exact date).   here for consult following PUS and EMB.   The patient has an enlarging fibroid uterus with menorrhagia (not anemic), dyspareunia and pelvic pain. Given her tenderness on exam last month she was treated for a possible PID.Labs from that visit returned with a normal CBC, normal TSH, normal pap and a negative GC/CT.  GYNECOLOGIC HISTORY: Patient's last menstrual period was 07/21/2018 (exact date). Contraception: Condoms Menopausal hormone therapy: None        OB History    Gravida  0   Para  0   Term  0   Preterm  0   AB  0   Living  0     SAB  0   TAB  0   Ectopic  0   Multiple  0   Live Births  0              Patient Active Problem List   Diagnosis Date Noted  . Anxiety disorder, unspecified 02/21/2016  . Type 2 diabetes mellitus ([place]) 05/02/2012  . BMI 40.0-44.9, adult ([place]) 05/02/2012  . Allergic rhinitis 05/02/2012    Past Medical History:  Diagnosis Date  . Abnormal uterine bleeding   . Anxiety   . Depression   . Diabetes mellitus without complication ([place])   . Fibroid   . Hypertension     History reviewed. No pertinent surgical history.  Current Outpatient Medications  Medication Sig Dispense Refill  . atorvastatin (LIPITOR) 10 MG tablet Take 10 mg by mouth daily.    Marland Kitchen glipiZIDE (GLUCOTROL) 5 MG tablet Take by mouth daily before breakfast.    . lisinopril (PRINIVIL,ZESTRIL) 10 MG tablet Take 10 mg by mouth daily.    . metFORMIN (GLUCOPHAGE) 500 MG tablet Take by mouth 3 (three) times daily.     No current facility-administered medications for this visit.      ALLERGIES: Shellfish allergy  Family History  Problem Relation Age of Onset  . Diabetes Mother   . Hypertension Mother   . Hypertension Father   . Diabetes Sister   . Diabetes Maternal Aunt   . Diabetes  Maternal Uncle   . Heart disease Maternal Grandmother   . Diabetes Maternal Grandmother   . Stroke Maternal Grandmother     Social History   Socioeconomic History  . Marital status: Single    Spouse name: Not on file  . Number of children: Not on file  . Years of education: Not on file  . Highest education level: Not on file  Occupational History  . Not on file  Social Needs  . Financial resource strain: Not on file  . Food insecurity:    Worry: Not on file    Inability: Not on file  . Transportation needs:    Medical: Not on file    Non-medical: Not on file  Tobacco Use  . Smoking status: Never Smoker  . Smokeless tobacco: Never Used  Substance and Sexual Activity  . Alcohol use: Not Currently  . Drug use: No  . Sexual activity: Yes    Birth control/protection: Condom  Lifestyle  . Physical activity:    Days per week: Not on file    Minutes per session: Not on file  . Stress: Not on file  Relationships  . Social connections:  Talks on phone: Not on file    Gets together: Not on file    Attends religious service: Not on file    Active member of club or organization: Not on file    Attends meetings of clubs or organizations: Not on file    Relationship status: Not on file  . Intimate partner violence:    Fear of current or ex partner: Not on file    Emotionally abused: Not on file    Physically abused: Not on file    Forced sexual activity: Not on file  Other Topics Concern  . Not on file  Social History Narrative  . Not on file    Review of Systems  Constitutional: Negative.   HENT: Negative.   Eyes: Negative.   Respiratory: Negative.   Cardiovascular: Negative.   Gastrointestinal: Negative.   Genitourinary: Negative.   Musculoskeletal: Negative.   Skin: Negative.   Neurological: Negative.   Endo/Heme/Allergies: Negative.   Psychiatric/Behavioral: Negative.     PHYSICAL EXAMINATION:    BP 132/86 (BP Location: Right Arm, Patient Position:  Sitting, Cuff Size: Normal)   Pulse 80   Wt 253 lb (114.8 kg)   LMP 07/21/2018 (Exact Date)   BMI 42.10 kg/m     General appearance: alert, cooperative and appears stated age   Pelvic: External genitalia:  no lesions              Urethra:  normal appearing urethra with no masses, tenderness or lesions              Bartholins and Skenes: normal                 Vagina: normal appearing vagina with normal color and discharge, no lesions              Cervix: no lesions  The risks of endometrial biopsy were reviewed and a consent was obtained.  A speculum was placed in the vagina and the cervix was cleansed with betadine. The uterine evacuator was placed into the endometrial cavity. The uterus sounded to ~12 cm. The endometrial biopsy was performed, a large amount of blood and  tissue was obtained. The speculum was removed. There were no complications.    Chaperone was present for exam.  ASSESSMENT Enlarging fibroid uterus Menorrhagia (no anemia) Pelvic pain/dyspareunia H/O HTN and diabetes BMI 42    PLAN -UPT negative -Endometrial biopsy -She is interested in pregnancy, cautioned her not to put this off, increased risks, concerns of her fibroids growing more.  -Given her large fibroid uterus and multiple medical issues, will send her for a pre-pregnancy consultation with MFM -For cycle control, she can't be on OCP's secondary to her HTN, we did discuss a mirena IUD (information given). I advised her that secondary to her cavity size she shouldn't rely on the mirena for contraception but it should help with her menorrhagia (she would have an increased risk of expulsion), she could also try the mini-pill. Will further discuss her options after her endometrial biopsy is back   An After Visit Summary was printed and given to the patient.

## 2018-07-22 NOTE — Telephone Encounter (Signed)
-----   Message from Salvadore Dom, MD sent at 07/22/2018  2:09 PM EST ----- Can you please set up a MFM consult (see note)

## 2018-07-22 NOTE — Telephone Encounter (Signed)
Spoke with patient. Advised Dr.Jertson recommends referral to MFM for consult regarding hypertension, fibroids, and diabetes prior to trying for pregnancy. Patient is agreeable. Referral placed to MFM. Patient is aware that she will be contacted directly to schedule an appointment. Patient is agreeable. Encounter closed.

## 2018-07-28 ENCOUNTER — Telehealth: Payer: Self-pay

## 2018-07-28 MED ORDER — NORETHINDRONE 0.35 MG PO TABS: 1.0000 | ORAL_TABLET | Freq: Every day | ORAL | 0 refills | Status: DC

## 2018-07-28 NOTE — Telephone Encounter (Signed)
Spoke with patient. Message given as seen below from [place]. Patient verbalizes understanding. Rx for Micronor #3 0RF sent to pharmacy on file. 3 month follow up scheduled for 10/29/2018 at 2:30 pm. Patient is agreeable to date and time. Encounter closed.

## 2018-07-28 NOTE — Telephone Encounter (Signed)
Patient returning call to Kaitlyn. °

## 2018-07-28 NOTE — Telephone Encounter (Signed)
-----   Message from Salvadore Dom, MD sent at 07/24/2018  5:48 PM EST ----- Please let the patient know that her endometrial biopsy is benign and see if she is interested in trying the mirena IUD or the mini-pill to try and control her bleeding.

## 2018-07-28 NOTE — Telephone Encounter (Signed)
Given her desire for pregnancy, I would probably start with the mini-pill. Please call that in for 3 months and have her return for f/u

## 2018-07-28 NOTE — Telephone Encounter (Signed)
Spoke with patient. Results given. Patient verbalizes understanding. Patient states that she is open to both options. Would like to know what Dr.Jertson feels would be her best option. Patient's only concern is weight gain. Advised will review with Dr.Jertson and return call.

## 2018-07-28 NOTE — Telephone Encounter (Signed)
Left message to call Odalis Jordan at [phone]. 

## 2018-08-13 ENCOUNTER — Telehealth: Payer: Self-pay | Admitting: Obstetrics and Gynecology

## 2018-08-13 NOTE — Telephone Encounter (Signed)
Left voicemail regarding referral appointment. The information is listed below. Should the patient need to cancel or reschedule this appointment, Please advise them to call the office they've been referred to in order to reschedule.  [facility]- [address]st floor [address],  [place]  [address]   Phone: [phone]  Appointment 08/22/18 @ 9:00 am. Please arrive 15 minutes early and bring your insurance card and photo id and list of medications

## 2018-08-20 ENCOUNTER — Telehealth: Payer: Self-pay | Admitting: Obstetrics and Gynecology

## 2018-08-21 ENCOUNTER — Encounter: Payer: Self-pay | Admitting: Obstetrics and Gynecology

## 2018-08-21 ENCOUNTER — Telehealth: Payer: Self-pay | Admitting: Obstetrics and Gynecology

## 2018-08-21 NOTE — Telephone Encounter (Signed)
Spoke with patient. LMP 07/22/18. Seen in office on 07/22/18 for EMB, negative and PUS, showed fibroids. UPT negative in office, has not been SA since OV. Did not start POP, has picked up RX, plans to start with menses. Reports menses cramping 3/10 and spotting, "like cycle is trying to start". Denies any other GYN symptoms. Asking if there is anything she should do or if biopsy caused late menses?   Advised if any chance of pregnancy take UPT, if negative continue to monitor, start POP with menses. Advised can take 3 months for cycles to adjust to POP. Will review with Dr. Talbert Nan and return call if any additional recommendations. Patient agreeable.   Dr. Talbert Nan -any additional recommendations?

## 2018-08-21 NOTE — Telephone Encounter (Signed)
Left message to call Sharee Pimple, RN at [facility].   And MyChart message to patient.   07/22/18 EMB & PUS, to start POP. Has MFM appt 09/18/18.

## 2018-08-21 NOTE — Telephone Encounter (Signed)
Patient sent the following correspondence through [place]. Routing to triage to assist patient with request.  Message   Hi Dr. Talbert Bell, My period is late this month, last time I had sex was January 25 and had an pregnancy test during the biospy which was negative... So was wondering if the biospy could possibly make my period late? I have a little spotting and cramping but that's it

## 2018-08-22 ENCOUNTER — Encounter (HOSPITAL_COMMUNITY): Payer: BLUE CROSS/BLUE SHIELD

## 2018-08-22 NOTE — Telephone Encounter (Signed)
Call to patient, left detailed message, ok per dpr, name identified on voicemail. Advised as seen below per Dr. Talbert Nan. Return call to office if any additional questions/cocnerns.   Encounter closed.

## 2018-08-22 NOTE — Telephone Encounter (Signed)
I would imagine she will start her cycle any day, LMP was 07/22/18. If she doesn't start her cycle in a week she should again check a UPT and let us know. I would start her POP with her cycle.

## 2018-09-17 ENCOUNTER — Ambulatory Visit (HOSPITAL_COMMUNITY): Payer: BLUE CROSS/BLUE SHIELD | Attending: Obstetrics and Gynecology

## 2018-09-18 ENCOUNTER — Ambulatory Visit (HOSPITAL_COMMUNITY): Payer: BLUE CROSS/BLUE SHIELD

## 2018-10-29 ENCOUNTER — Ambulatory Visit: Payer: 59 | Admitting: Obstetrics and Gynecology

## 2018-10-29 ENCOUNTER — Encounter: Payer: Self-pay | Admitting: Obstetrics and Gynecology

## 2018-10-29 ENCOUNTER — Telehealth: Payer: Self-pay | Admitting: Obstetrics and Gynecology

## 2018-10-29 NOTE — Progress Notes (Deleted)
GYNECOLOGY  VISIT   HPI: 38 y.o.   Single Black or African American Not Hispanic or Latino  female   G0P0000 with No LMP recorded.   here for     GYNECOLOGIC HISTORY: No LMP recorded. Contraception:*** Menopausal hormone therapy: ***        OB History    Gravida  0   Para  0   Term  0   Preterm  0   AB  0   Living  0     SAB  0   TAB  0   Ectopic  0   Multiple  0   Live Births  0              Patient Active Problem List   Diagnosis Date Noted  . Anxiety disorder, unspecified 02/21/2016  . Type 2 diabetes mellitus ([place]) 05/02/2012  . BMI 40.0-44.9, adult ([place]) 05/02/2012  . Allergic rhinitis 05/02/2012    Past Medical History:  Diagnosis Date  . Abnormal uterine bleeding   . Anxiety   . Depression   . Diabetes mellitus without complication ([place])   . Fibroid   . Hypertension     No past surgical history on file.  Current Outpatient Medications  Medication Sig Dispense Refill  . atorvastatin (LIPITOR) 10 MG tablet Take 10 mg by mouth daily.    Marland Kitchen glipiZIDE (GLUCOTROL) 5 MG tablet Take by mouth daily before breakfast.    . lisinopril (PRINIVIL,ZESTRIL) 10 MG tablet Take 10 mg by mouth daily.    . metFORMIN (GLUCOPHAGE) 500 MG tablet Take by mouth 3 (three) times daily.    . norethindrone (MICRONOR,CAMILA,ERRIN) 0.35 MG tablet Take 1 tablet (0.35 mg total) by mouth daily. 3 Package 0   No current facility-administered medications for this visit.      ALLERGIES: Shellfish allergy  Family History  Problem Relation Age of Onset  . Diabetes Mother   . Hypertension Mother   . Hypertension Father   . Diabetes Sister   . Diabetes Maternal Aunt   . Diabetes Maternal Uncle   . Heart disease Maternal Grandmother   . Diabetes Maternal Grandmother   . Stroke Maternal Grandmother     Social History   Socioeconomic History  . Marital status: Single    Spouse name: Not on file  . Number of children: Not on file  . Years of education: Not on file   . Highest education level: Not on file  Occupational History  . Not on file  Social Needs  . Financial resource strain: Not on file  . Food insecurity:    Worry: Not on file    Inability: Not on file  . Transportation needs:    Medical: Not on file    Non-medical: Not on file  Tobacco Use  . Smoking status: Never Smoker  . Smokeless tobacco: Never Used  Substance and Sexual Activity  . Alcohol use: Not Currently  . Drug use: No  . Sexual activity: Yes    Birth control/protection: Condom  Lifestyle  . Physical activity:    Days per week: Not on file    Minutes per session: Not on file  . Stress: Not on file  Relationships  . Social connections:    Talks on phone: Not on file    Gets together: Not on file    Attends religious service: Not on file    Active member of club or organization: Not on file    Attends meetings of  clubs or organizations: Not on file    Relationship status: Not on file  . Intimate partner violence:    Fear of current or ex partner: Not on file    Emotionally abused: Not on file    Physically abused: Not on file    Forced sexual activity: Not on file  Other Topics Concern  . Not on file  Social History Narrative  . Not on file    ROS  PHYSICAL EXAMINATION:    There were no vitals taken for this visit.    General appearance: alert, cooperative and appears stated age Neck: no adenopathy, supple, symmetrical, trachea midline and thyroid {CHL AMB PHY EX THYROID NORM [phone]::"normal to inspection and palpation"} Breasts: {Exam; breast:13139::"normal appearance, no masses or tenderness"} Abdomen: soft, non-tender; non distended, no masses,  no organomegaly  Pelvic: External genitalia:  no lesions              Urethra:  normal appearing urethra with no masses, tenderness or lesions              Bartholins and Skenes: normal                 Vagina: normal appearing vagina with normal color and discharge, no lesions               Cervix: {CHL AMB PHY EX CERVIX NORM DEFAULT:[phone]::"no lesions"}              Bimanual Exam:  Uterus:  {CHL AMB PHY EX UTERUS NORM DEFAULT:[phone]::"normal size, contour, position, consistency, mobility, non-tender"}              Adnexa: {CHL AMB PHY EX ADNEXA NO MASS DEFAULT:[phone]::"no mass, fullness, tenderness"}              Rectovaginal: {yes no:[id number]}.  Confirms.              Anus:  normal sphincter tone, no lesions  Chaperone was present for exam.  ASSESSMENT     PLAN    An After Visit Summary was printed and given to the patient.  *** minutes face to face time of which over 50% was spent in counseling.

## 2018-10-29 NOTE — Telephone Encounter (Signed)
Patient dnka her 3 month "POP follow up". I left a message for her to call and reschedule.

## 2019-06-17 ENCOUNTER — Ambulatory Visit: Payer: 59 | Admitting: Obstetrics and Gynecology

## 2019-07-21 ENCOUNTER — Other Ambulatory Visit: Payer: Self-pay

## 2019-07-22 NOTE — Progress Notes (Signed)
39 y.o. G0P0000 Single Black or African American Not Hispanic or Latino female here for annual exam.  She states that she is feeling pressure and she has tender spots in her abdomen she states that she feels bloated. She has gained weight during covid. She has a known fibroid uterus and a h/o menorrhagia (without anemia). She is on the minipill. Last year her uterus was larger at 16 week sized (up from 10-12 week sized in 2018).  She still has some entry dyspareunia, some help with lubricant, tolerable. Intermittent deep dyspareunia, most of the time. Positional.  She is with same partner, live together. They think they have decided don't want to have children. Period Cycle (Days): 21 Period Duration (Days): 7 Period Pattern: (!) Irregular Menstrual Flow: Heavy Menstrual Control: Tampon, Maxi pad Menstrual Control Change Freq (Hours): hourly Dysmenorrhea: (!) Moderate Dysmenorrhea Symptoms: Cramping  Patient's last menstrual period was 07/02/2019.          Sexually active: Yes.    The current method of family planning is OCP (estrogen/progesterone).    Exercising: Yes.    elliptical  Smoker:  No  She is struggling with diabetes, HgbA1C was 12  Health Maintenance: Pap:  06/12/18 Normal HR HPV Neg 03/12/2016 WNL with NEG HR HPV History of abnormal Pap:  no MMG:  03-25-13 WNL- start routine screening at age 63 TDaP:  11/19/11 Gardasil: no   reports that she has never smoked. She has never used smokeless tobacco. She reports previous alcohol use. She reports that she does not use drugs. Social ETOH.  She works at [employer] in [place]. She lives with her boyfriend, just bought a home last year.   Past Medical History:  Diagnosis Date  . Abnormal uterine bleeding   . Anxiety   . Depression   . Diabetes mellitus without complication ([place])   . Fibroid   . Hypertension     History reviewed. No pertinent surgical history.  Current Outpatient Medications  Medication Sig Dispense Refill  .  atorvastatin (LIPITOR) 10 MG tablet Take 10 mg by mouth daily.    Marland Kitchen glipiZIDE (GLUCOTROL) 5 MG tablet Take by mouth daily before breakfast.    . lisinopril (PRINIVIL,ZESTRIL) 10 MG tablet Take 10 mg by mouth daily.    . metFORMIN (GLUCOPHAGE) 500 MG tablet Take by mouth 3 (three) times daily.    . norethindrone (MICRONOR,CAMILA,ERRIN) 0.35 MG tablet Take 1 tablet (0.35 mg total) by mouth daily. 3 Package 0   No current facility-administered medications for this visit.    Family History  Problem Relation Age of Onset  . Diabetes Mother   . Hypertension Mother   . Hypertension Father   . Diabetes Sister   . Diabetes Maternal Aunt   . Diabetes Maternal Uncle   . Heart disease Maternal Grandmother   . Diabetes Maternal Grandmother   . Stroke Maternal Grandmother     Review of Systems  Gastrointestinal: Positive for abdominal distention and constipation.  All other systems reviewed and are negative.   Exam:   BP 126/70   Pulse (!) 110   Temp (!) 97.3 F (36.3 C)   Ht 5\' 6"  (1.676 m)   Wt 258 lb (117 kg)   LMP 07/02/2019   SpO2 99%   BMI 41.64 kg/m   Weight change: @WEIGHTCHANGE @ Height:   Height: 5\' 6"  (167.6 cm)  Ht Readings from Last 3 Encounters:  07/23/19 5\' 6"  (1.676 m)  06/12/18 5\' 5"  (1.651 m)  02/27/16 5' 5.25" (  1.657 m)    General appearance: alert, cooperative and appears stated age Head: Normocephalic, without obvious abnormality, atraumatic Neck: no adenopathy, supple, symmetrical, trachea midline and thyroid normal to inspection and palpation Lungs: clear to auscultation bilaterally Cardiovascular: regular rate and rhythm Breasts: normal appearance, no masses or tenderness Abdomen: soft, non-tender; non distended,  no masses,  no organomegaly Extremities: extremities normal, atraumatic, no cyanosis or edema Skin: Skin color, texture, turgor normal. No rashes or lesions Lymph nodes: Cervical, supraclavicular, and axillary nodes normal. No abnormal inguinal  nodes palpated Neurologic: Grossly normal   Pelvic: External genitalia:  no lesions              Urethra:  normal appearing urethra with no masses, tenderness or lesions              Bartholins and Skenes: normal                 Vagina: normal appearing vagina with normal color and discharge, no lesions              Cervix: no lesions               Bimanual Exam:  Uterus:  ~18 week sized, not filling her pelvis, tender, mobile.               Adnexa: no mass, fullness, tenderness               Rectovaginal: Confirms               Anus:  normal sphincter tone, no lesions  Pelvic floor: not tender  Gae Dry chaperoned for the exam.  A:  Well Woman with normal exam  Fibroid uterus  Menorrhagia, not anemic on recent blood work with her primary  Dyspareunia, entry and deep, tolerable  Contraception, continue micronor  Obesity, uncontrolled DM, controlled HTN  P:   No pap this year  Start gardasil  Discussed that she absolutely shouldn't get pregnant unless her diabetes is under control. She is thinking that she likely will choose not to get pregnant  Discussed options of continuing micronor, trying a mirena IUD (would need other contraception), depo-provera (concerns of weight gain), Lysteda, myomectomy. If she is sure she doesn't want children she could consider uterine artery embolization and hysterectomy  Will continue with micronor  Add lysteda, discussed risk of blood clots (not to be used with combination ocp's, but she is on progesterone only pills)  She will call to let me know how her cycle goes with lysteda  Discussed weight loss

## 2019-07-23 ENCOUNTER — Ambulatory Visit (INDEPENDENT_AMBULATORY_CARE_PROVIDER_SITE_OTHER): Payer: 59 | Admitting: Obstetrics and Gynecology

## 2019-07-23 ENCOUNTER — Encounter: Payer: Self-pay | Admitting: Obstetrics and Gynecology

## 2019-07-23 ENCOUNTER — Other Ambulatory Visit: Payer: Self-pay

## 2019-07-23 VITALS — BP 126/70 | HR 110 | Temp 97.3°F | Ht 66.0 in | Wt 258.0 lb

## 2019-07-23 DIAGNOSIS — Z3041 Encounter for surveillance of contraceptive pills: Secondary | ICD-10-CM

## 2019-07-23 DIAGNOSIS — Z01419 Encounter for gynecological examination (general) (routine) without abnormal findings: Secondary | ICD-10-CM

## 2019-07-23 DIAGNOSIS — D259 Leiomyoma of uterus, unspecified: Secondary | ICD-10-CM | POA: Diagnosis not present

## 2019-07-23 DIAGNOSIS — N920 Excessive and frequent menstruation with regular cycle: Secondary | ICD-10-CM | POA: Diagnosis not present

## 2019-07-23 DIAGNOSIS — I10 Essential (primary) hypertension: Secondary | ICD-10-CM

## 2019-07-23 DIAGNOSIS — Z23 Encounter for immunization: Secondary | ICD-10-CM | POA: Diagnosis not present

## 2019-07-23 DIAGNOSIS — Z8639 Personal history of other endocrine, nutritional and metabolic disease: Secondary | ICD-10-CM | POA: Diagnosis not present

## 2019-07-23 DIAGNOSIS — Z6841 Body Mass Index (BMI) 40.0 and over, adult: Secondary | ICD-10-CM

## 2019-07-23 DIAGNOSIS — N941 Unspecified dyspareunia: Secondary | ICD-10-CM

## 2019-07-23 MED ORDER — TRANEXAMIC ACID 650 MG PO TABS: 1300.0000 mg | ORAL_TABLET | Freq: Three times a day (TID) | ORAL | 1 refills | Status: DC

## 2019-07-23 MED ORDER — NORETHINDRONE 0.35 MG PO TABS: 1.0000 | ORAL_TABLET | Freq: Every day | ORAL | 3 refills | Status: DC

## 2019-07-23 NOTE — Patient Instructions (Signed)
EXERCISE AND DIET:  We recommended that you start or continue a regular exercise program for good health. Regular exercise means any activity that makes your heart beat faster and makes you sweat.  We recommend exercising at least 30 minutes per day at least 3 days a week, preferably 4 or 5.  We also recommend a diet low in fat and sugar.  Inactivity, poor dietary choices and obesity can cause diabetes, heart attack, stroke, and kidney damage, among others.    ALCOHOL AND SMOKING:  Women should limit their alcohol intake to no more than 7 drinks/beers/glasses of wine (combined, not each!) per week. Moderation of alcohol intake to this level decreases your risk of breast cancer and liver damage. And of course, no recreational drugs are part of a healthy lifestyle.  And absolutely no smoking or even second hand smoke. Most people know smoking can cause heart and lung diseases, but did you know it also contributes to weakening of your bones? Aging of your skin?  Yellowing of your teeth and nails?  CALCIUM AND VITAMIN D:  Adequate intake of calcium and Vitamin D are recommended.  The recommendations for exact amounts of these supplements seem to change often, but generally speaking 1,000 mg of calcium (between diet and supplement) and 800 units of Vitamin D per day seems prudent. Certain women may benefit from higher intake of Vitamin D.  If you are among these women, your doctor will have told you during your visit.    PAP SMEARS:  Pap smears, to check for cervical cancer or precancers,  have traditionally been done yearly, although recent scientific advances have shown that most women can have pap smears less often.  However, every woman still should have a physical exam from her gynecologist every year. It will include a breast check, inspection of the vulva and vagina to check for abnormal growths or skin changes, a visual exam of the cervix, and then an exam to evaluate the size and shape of the uterus and  ovaries.  And after 38 years of age, a rectal exam is indicated to check for rectal cancers. We will also provide age appropriate advice regarding health maintenance, like when you should have certain vaccines, screening for sexually transmitted diseases, bone density testing, colonoscopy, mammograms, etc.   MAMMOGRAMS:  All women over 40 years old should have a yearly mammogram. Many facilities now offer a "3D" mammogram, which may cost around $50 extra out of pocket. If possible,  we recommend you accept the option to have the 3D mammogram performed.  It both reduces the number of women who will be called back for extra views which then turn out to be normal, and it is better than the routine mammogram at detecting truly abnormal areas.    COLON CANCER SCREENING: Now recommend starting at age 45. At this time colonoscopy is not covered for routine screening until 50. There are take home tests that can be done between 45-49.   COLONOSCOPY:  Colonoscopy to screen for colon cancer is recommended for all women at age 50.  We know, you hate the idea of the prep.  We agree, BUT, having colon cancer and not knowing it is worse!!  Colon cancer so often starts as a polyp that can be seen and removed at colonscopy, which can quite literally save your life!  And if your first colonoscopy is normal and you have no family history of colon cancer, most women don't have to have it again for   10 years.  Once every ten years, you can do something that may end up saving your life, right?  We will be happy to help you get it scheduled when you are ready.  Be sure to check your insurance coverage so you understand how much it will cost.  It may be covered as a preventative service at no cost, but you should check your particular policy.      Breast Self-Awareness Breast self-awareness means being familiar with how your breasts look and feel. It involves checking your breasts regularly and reporting any changes to your  health care provider. Practicing breast self-awareness is important. A change in your breasts can be a sign of a serious medical problem. Being familiar with how your breasts look and feel allows you to find any problems early, when treatment is more likely to be successful. All women should practice breast self-awareness, including women who have had breast implants. How to do a breast self-exam One way to learn what is normal for your breasts and whether your breasts are changing is to do a breast self-exam. To do a breast self-exam: Look for Changes  1. Remove all the clothing above your waist. 2. Stand in front of a mirror in a room with good lighting. 3. Put your hands on your hips. 4. Push your hands firmly downward. 5. Compare your breasts in the mirror. Look for differences between them (asymmetry), such as: ? Differences in shape. ? Differences in size. ? Puckers, dips, and bumps in one breast and not the other. 6. Look at each breast for changes in your skin, such as: ? Redness. ? Scaly areas. 7. Look for changes in your nipples, such as: ? Discharge. ? Bleeding. ? Dimpling. ? Redness. ? A change in position. Feel for Changes Carefully feel your breasts for lumps and changes. It is best to do this while lying on your back on the floor and again while sitting or standing in the shower or tub with soapy water on your skin. Feel each breast in the following way:  Place the arm on the side of the breast you are examining above your head.  Feel your breast with the other hand.  Start in the nipple area and make  inch (2 cm) overlapping circles to feel your breast. Use the pads of your three middle fingers to do this. Apply light pressure, then medium pressure, then firm pressure. The light pressure will allow you to feel the tissue closest to the skin. The medium pressure will allow you to feel the tissue that is a little deeper. The firm pressure will allow you to feel the tissue  close to the ribs.  Continue the overlapping circles, moving downward over the breast until you feel your ribs below your breast.  Move one finger-width toward the center of the body. Continue to use the  inch (2 cm) overlapping circles to feel your breast as you move slowly up toward your collarbone.  Continue the up and down exam using all three pressures until you reach your armpit.  Write Down What You Find  Write down what is normal for each breast and any changes that you find. Keep a written record with breast changes or normal findings for each breast. By writing this information down, you do not need to depend only on memory for size, tenderness, or location. Write down where you are in your menstrual cycle, if you are still menstruating. If you are having trouble noticing differences   in your breasts, do not get discouraged. With time you will become more familiar with the variations in your breasts and more comfortable with the exam. How often should I examine my breasts? Examine your breasts every month. If you are breastfeeding, the best time to examine your breasts is after a feeding or after using a breast pump. If you menstruate, the best time to examine your breasts is 5-7 days after your period is over. During your period, your breasts are lumpier, and it may be more difficult to notice changes. When should I see my health care provider? See your health care provider if you notice:  A change in shape or size of your breasts or nipples.  A change in the skin of your breast or nipples, such as a reddened or scaly area.  Unusual discharge from your nipples.  A lump or thick area that was not there before.  Pain in your breasts.  Anything that concerns you.  Uterine Fibroids  Uterine fibroids (leiomyomas) are noncancerous (benign) tumors that can develop in the uterus. Fibroids may also develop in the fallopian tubes, cervix, or tissues (ligaments) near the uterus. You  may have one or many fibroids. Fibroids vary in size, weight, and where they grow in the uterus. Some can become quite large. Most fibroids do not require medical treatment. What are the causes? The cause of this condition is not known. What increases the risk? You are more likely to develop this condition if you:  Are in your 30s or 40s and have not gone through menopause.  Have a family history of this condition.  Are of African-American descent.  Had your first period at an early age (early menarche).  Have not had any children (nulliparity).  Are overweight or obese. What are the signs or symptoms? Many women do not have any symptoms. Symptoms of this condition may include:  Heavy menstrual bleeding.  Bleeding or spotting between periods.  Pain and pressure in the pelvic area, between the hips.  Bladder problems, such as needing to urinate urgently or more often than usual.  Inability to have children (infertility).  Failure to carry pregnancy to term (miscarriage). How is this diagnosed? This condition may be diagnosed based on:  Your symptoms and medical history.  A physical exam.  A pelvic exam that includes feeling for any tumors.  Imaging tests, such as ultrasound or MRI. How is this treated? Treatment for this condition may include:  Seeing your health care provider for follow-up visits to monitor your fibroids for any changes.  Taking NSAIDs such as ibuprofen, naproxen, or aspirin to reduce pain.  Hormone medicines. These may be taken as a pill, given in an injection, or delivered by a T-shaped device that is inserted into the uterus (intrauterine device, IUD).  Surgery to remove one of the following: ? The fibroids (myomectomy). Your health care provider may recommend this if fibroids affect your fertility and you want to become pregnant. ? The uterus (hysterectomy). ? Blood supply to the fibroids (uterine artery embolization). Follow these  instructions at home:  Take over-the-counter and prescription medicines only as told by your health care provider.  Ask your health care provider if you should take iron pills or eat more iron-rich foods, such as dark green, leafy vegetables. Heavy menstrual bleeding can cause low iron levels.  If directed, apply heat to your back or abdomen to reduce pain. Use the heat source that your health care provider recommends, such as a moist  heat pack or a heating pad. ? Place a towel between your skin and the heat source. ? Leave the heat on for 20-30 minutes. ? Remove the heat if your skin turns bright red. This is especially important if you are unable to feel pain, heat, or cold. You may have a greater risk of getting burned.  Pay close attention to your menstrual cycle. Tell your health care provider about any changes, such as: ? Increased blood flow that requires you to use more pads or tampons than usual. ? A change in the number of days that your period lasts. ? A change in symptoms that are associated with your period, such as back pain or cramps in your abdomen.  Keep all follow-up visits as told by your health care provider. This is important, especially if your fibroids need to be monitored for any changes. Contact a health care provider if you:  Have pelvic pain, back pain, or cramps in your abdomen that do not get better with medicine or heat.  Develop new bleeding between periods.  Have increased bleeding during or between periods.  Feel unusually tired or weak.  Feel light-headed. Get help right away if you:  Faint.  Have pelvic pain that suddenly gets worse.  Have severe vaginal bleeding that soaks a tampon or pad in 30 minutes or less. Summary  Uterine fibroids are noncancerous (benign) tumors that can develop in the uterus.  The exact cause of this condition is not known.  Most fibroids do not require medical treatment unless they affect your ability to have  children (fertility).  Contact a health care provider if you have pelvic pain, back pain, or cramps in your abdomen that do not get better with medicines.  Make sure you know what symptoms should cause you to get help right away. This information is not intended to replace advice given to you by your health care provider. Make sure you discuss any questions you have with your health care provider. Document Revised: 05/10/2017 Document Reviewed: 04/23/2017 Elsevier Patient Education  [place].  Tranexamic acid oral tablets What is this medicine? TRANEXAMIC ACID (TRAN ex AM ik AS id) slows down or stops blood clots from being broken down. This medicine is used to treat heavy monthly menstrual bleeding. This medicine may be used for other purposes; ask your health care provider or pharmacist if you have questions. COMMON BRAND NAME(S): Cyklokapron, Lysteda What should I tell my health care provider before I take this medicine? They need to know if you have any of these conditions:  bleeding in the brain  blood clotting problems  kidney disease  vision problems  an unusual allergic reaction to tranexamic acid, other medicines, foods, dyes, or preservatives  pregnant or trying to get pregnant  breast-feeding How should I use this medicine? Take this medicine by mouth with a glass of water. Follow the directions on the prescription label. Do not cut, crush, or chew this medicine. You can take it with or without food. If it upsets your stomach, take it with food. Take your medicine at regular intervals. Do not take it more often than directed. Do not stop taking except on your doctor's advice. Do not take this medicine until your period has started. Do not take it for more than 5 days in a row. Do not take this medicine when you do not have your period. Talk to your pediatrician regarding the use of this medicine in children. While this drug may be  prescribed for female children  as young as 4 years of age for selected conditions, precautions do apply. Overdosage: If you think you have taken too much of this medicine contact a poison control center or emergency room at once. NOTE: This medicine is only for you. Do not share this medicine with others. What if I miss a dose? If you miss a dose, take it when you remember, and then take your next dose at least 6 hours later. Do not take more than 2 tablets at a time to make up for missed doses. What may interact with this medicine? Do not take this medicine with any of the following medications:  estrogens  birth control pills, patches, injections, rings or other devices that contain both an estrogen and a progestin This medicine may also interact with the following medications:  certain medicines used to help your blood clot  tretinoin (taken by mouth) This list may not describe all possible interactions. Give your health care provider a list of all the medicines, herbs, non-prescription drugs, or dietary supplements you use. Also tell them if you smoke, drink alcohol, or use illegal drugs. Some items may interact with your medicine. What should I watch for while using this medicine? Tell your doctor or healthcare professional if your symptoms do not start to get better or if they get worse. Tell your doctor or healthcare professional if you notice any eye problems while taking this medicine. Your doctor will refer you to an eye doctor who will examine your eyes. What side effects may I notice from receiving this medicine? Side effects that you should report to your doctor or health care professional as soon as possible:  allergic reactions like skin rash, itching or hives, swelling of the face, lips, or tongue  breathing difficulties  changes in vision  sudden or severe pain in the chest, legs, head, or groin  unusually weak or tired Side effects that usually do not require medical attention (report to your  doctor or health care professional if they continue or are bothersome):  back pain  headache  muscle or joint aches  sinus and nasal problems  stomach pain  tiredness This list may not describe all possible side effects. Call your doctor for medical advice about side effects. You may report side effects to FDA at 1-800-FDA-1088. Where should I keep my medicine? Keep out of the reach of children. Store at room temperature between 15 and 30 degrees C (59 and 86 degrees F). Throw away any unused medicine after the expiration date. NOTE: This sheet is a summary. It may not cover all possible information. If you have questions about this medicine, talk to your doctor, pharmacist, or health care provider.  2020 Elsevier/Gold Standard (2015-06-30 09:12:15)

## 2019-08-27 ENCOUNTER — Ambulatory Visit: Payer: BLUE CROSS/BLUE SHIELD

## 2019-09-18 ENCOUNTER — Telehealth: Payer: Self-pay

## 2019-09-18 NOTE — Telephone Encounter (Signed)
Patient called to cancel 2nd gardasil appointment due to getting covid vaccine same day (09/21/19). Patient would like to discuss when it would be okay to reschedule after the covid vaccine.

## 2019-09-18 NOTE — Telephone Encounter (Signed)
Left message to call Irineo Gaulin, RN at [facility] [phone].   

## 2019-09-21 ENCOUNTER — Ambulatory Visit: Payer: 59

## 2019-09-23 NOTE — Telephone Encounter (Signed)
Left message to call Euphemia Lingerfelt, RN at [facility] [phone].   

## 2019-10-01 NOTE — Telephone Encounter (Signed)
No return call from patient.   Call to patient, left detailed message, name identified on voicemail, ok per dpr.  Advised patient she should wait at least 2wks (14 days) after receiving the Covid 19 vaccine to schedule gardasil vaccine. If you are on birth control, can schedule at anytime during cycle. If no contraceptive, return call with next menses to schedule. Return call to office to schedule or if any additional questions.   Routing to Dr. Talbert Nan.   Encounter closed.

## 2020-05-20 NOTE — Telephone Encounter (Signed)
PATIENT AWARE OF APPT

## 2020-08-01 ENCOUNTER — Other Ambulatory Visit: Payer: Self-pay

## 2020-08-01 ENCOUNTER — Ambulatory Visit (INDEPENDENT_AMBULATORY_CARE_PROVIDER_SITE_OTHER): Payer: 59 | Admitting: Obstetrics and Gynecology

## 2020-08-01 ENCOUNTER — Encounter: Payer: Self-pay | Admitting: Obstetrics and Gynecology

## 2020-08-01 VITALS — BP 124/72 | HR 102 | Ht 64.0 in | Wt 258.0 lb

## 2020-08-01 DIAGNOSIS — Z23 Encounter for immunization: Secondary | ICD-10-CM | POA: Diagnosis not present

## 2020-08-01 DIAGNOSIS — Z6841 Body Mass Index (BMI) 40.0 and over, adult: Secondary | ICD-10-CM

## 2020-08-01 DIAGNOSIS — Z01419 Encounter for gynecological examination (general) (routine) without abnormal findings: Secondary | ICD-10-CM

## 2020-08-01 DIAGNOSIS — D259 Leiomyoma of uterus, unspecified: Secondary | ICD-10-CM | POA: Diagnosis not present

## 2020-08-01 DIAGNOSIS — N920 Excessive and frequent menstruation with regular cycle: Secondary | ICD-10-CM | POA: Diagnosis not present

## 2020-08-01 DIAGNOSIS — Z8639 Personal history of other endocrine, nutritional and metabolic disease: Secondary | ICD-10-CM

## 2020-08-01 NOTE — Patient Instructions (Addendum)
EXERCISE   We recommended that you start or continue a regular exercise program for good health. Physical activity is anything that gets your body moving, some is better than none. The CDC recommends 150 minutes per week of Moderate-Intensity Aerobic Activity and 2 or more days of Muscle Strengthening Activity.  Benefits of exercise are limitless: helps weight loss/weight maintenance, improves mood and energy, helps with depression and anxiety, improves sleep, tones and strengthens muscles, improves balance, improves bone density, protects from chronic conditions such as heart disease, high blood pressure and diabetes and so much more. To learn more visit: https://www.cdc.gov/physicalactivity/index.html  DIET: Good nutrition starts with a healthy diet of fruits, vegetables, whole grains, and lean protein sources. Drink plenty of water for hydration. Minimize empty calories, sodium, sweets. For more information about dietary recommendations visit: https://health.gov/our-work/nutrition-physical-activity/dietary-guidelines and https://www.myplate.gov/  ALCOHOL:  Women should limit their alcohol intake to no more than 7 drinks/beers/glasses of wine (combined, not each!) per week. Moderation of alcohol intake to this level decreases your risk of breast cancer and liver damage.  If you are concerned that you may have a problem, or your friends have told you they are concerned about your drinking, there are many resources to help. A well-known program that is free, effective, and available to all people all over the nation is Alcoholics Anonymous.  Check out this site to learn more: https://www.aa.org/   CALCIUM AND VITAMIN D:  Adequate intake of calcium and Vitamin D are recommended for bone health.  You should be getting between 1000-1200 mg of calcium and 800 units of Vitamin D daily between diet and supplements  PAP SMEARS:  Pap smears, to check for cervical cancer or precancers,  have traditionally been  done yearly, scientific advances have shown that most women can have pap smears less often.  However, every woman still should have a physical exam from her gynecologist every year. It will include a breast check, inspection of the vulva and vagina to check for abnormal growths or skin changes, a visual exam of the cervix, and then an exam to evaluate the size and shape of the uterus and ovaries. We will also provide age appropriate advice regarding health maintenance, like when you should have certain vaccines, screening for sexually transmitted diseases, bone density testing, colonoscopy, mammograms, etc.   MAMMOGRAMS:  All women over 40 years old should have a routine mammogram.   COLON CANCER SCREENING: Now recommend starting at age 45. At this time colonoscopy is not covered for routine screening until 50. There are take home tests that can be done between 45-49.   COLONOSCOPY:  Colonoscopy to screen for colon cancer is recommended for all women at age 50.  We know, you hate the idea of the prep.  We agree, BUT, having colon cancer and not knowing it is worse!!  Colon cancer so often starts as a polyp that can be seen and removed at colonscopy, which can quite literally save your life!  And if your first colonoscopy is normal and you have no family history of colon cancer, most women don't have to have it again for 10 years.  Once every ten years, you can do something that may end up saving your life, right?  We will be happy to help you get it scheduled when you are ready.  Be sure to check your insurance coverage so you understand how much it will cost.  It may be covered as a preventative service at no cost, but you should check   your particular policy.      Breast Self-Awareness Breast self-awareness means being familiar with how your breasts look and feel. It involves checking your breasts regularly and reporting any changes to your health care provider. Practicing breast self-awareness is  important. A change in your breasts can be a sign of a serious medical problem. Being familiar with how your breasts look and feel allows you to find any problems early, when treatment is more likely to be successful. All women should practice breast self-awareness, including women who have had breast implants. How to do a breast self-exam One way to learn what is normal for your breasts and whether your breasts are changing is to do a breast self-exam. To do a breast self-exam: Look for Changes  1. Remove all the clothing above your waist. 2. Stand in front of a mirror in a room with good lighting. 3. Put your hands on your hips. 4. Push your hands firmly downward. 5. Compare your breasts in the mirror. Look for differences between them (asymmetry), such as: ? Differences in shape. ? Differences in size. ? Puckers, dips, and bumps in one breast and not the other. 6. Look at each breast for changes in your skin, such as: ? Redness. ? Scaly areas. 7. Look for changes in your nipples, such as: ? Discharge. ? Bleeding. ? Dimpling. ? Redness. ? A change in position. Feel for Changes Carefully feel your breasts for lumps and changes. It is best to do this while lying on your back on the floor and again while sitting or standing in the shower or tub with soapy water on your skin. Feel each breast in the following way:  Place the arm on the side of the breast you are examining above your head.  Feel your breast with the other hand.  Start in the nipple area and make  inch (2 cm) overlapping circles to feel your breast. Use the pads of your three middle fingers to do this. Apply light pressure, then medium pressure, then firm pressure. The light pressure will allow you to feel the tissue closest to the skin. The medium pressure will allow you to feel the tissue that is a little deeper. The firm pressure will allow you to feel the tissue close to the ribs.  Continue the overlapping circles,  moving downward over the breast until you feel your ribs below your breast.  Move one finger-width toward the center of the body. Continue to use the  inch (2 cm) overlapping circles to feel your breast as you move slowly up toward your collarbone.  Continue the up and down exam using all three pressures until you reach your armpit.  Write Down What You Find  Write down what is normal for each breast and any changes that you find. Keep a written record with breast changes or normal findings for each breast. By writing this information down, you do not need to depend only on memory for size, tenderness, or location. Write down where you are in your menstrual cycle, if you are still menstruating. If you are having trouble noticing differences in your breasts, do not get discouraged. With time you will become more familiar with the variations in your breasts and more comfortable with the exam. How often should I examine my breasts? Examine your breasts every month. If you are breastfeeding, the best time to examine your breasts is after a feeding or after using a breast pump. If you menstruate, the best time to   examine your breasts is 5-7 days after your period is over. During your period, your breasts are lumpier, and it may be more difficult to notice changes. When should I see my health care provider? See your health care provider if you notice:  A change in shape or size of your breasts or nipples.  A change in the skin of your breast or nipples, such as a reddened or scaly area.  Unusual discharge from your nipples.  A lump or thick area that was not there before.  Pain in your breasts.  Anything that concerns you.   Uterine Fibroids  Uterine fibroids, also called leiomyomas, are noncancerous (benign) tumors that can grow in the uterus. They can cause heavy menstrual bleeding and pain. Fibroids may also grow in the fallopian tubes, cervix, or tissues (ligaments) near the uterus. You  may have one or many fibroids. Fibroids vary in size, weight, and where they grow in the uterus. Some can become quite large. Most fibroids do not require medical treatment. What are the causes? The cause of this condition is not known. What increases the risk? You are more likely to develop this condition if you:  Are in your 30s or 40s and have not gone through menopause.  Have a family history of this condition.  Are of African American descent.  Started your menstrual period at age 34 or younger.  Have never given birth.  Are overweight or obese. What are the signs or symptoms? Many women do not have any symptoms. Symptoms of this condition may include:  Heavy menstrual bleeding.  Bleeding between menstrual periods.  Pain and pressure in the pelvic area, between your hip bones.  Pain during sex.  Bladder problems, such as needing to urinate right away or more often than usual.  Inability to have children (infertility).  Failure to carry pregnancy to term (miscarriage). How is this diagnosed? This condition may be diagnosed based on:  Your symptoms and medical history.  A physical exam.  A pelvic exam that includes feeling for any tumors.  Imaging tests, such as ultrasound or MRI. How is this treated? Treatment for this condition may include follow-up visits with your health care provider to monitor your fibroids for any changes. Other treatment may include:  Medicines, such as: ? Medicines to relieve pain, including aspirin and NSAIDs, such as ibuprofen or naproxen. ? Hormone therapy. Treatment may be given as a pill or an injection, or it may be inserted into the uterus using an intrauterine device (IUD).  Surgery that would do one of the following: ? Remove the fibroids (myomectomy). This may be recommended if fibroids affect your fertility and you want to become pregnant. ? Remove the uterus (hysterectomy). ? Block the blood supply to the fibroids (uterine  artery embolization). This can cause them to shrink and die. Follow these instructions at home: Medicines  Take over-the-counter and prescription medicines only as told by your health care provider.  Ask your health care provider if you should take iron pills or eat more iron-rich foods, such as dark green, leafy vegetables. Heavy menstrual bleeding can cause low iron levels. Managing pain If directed, apply heat to your back or abdomen to reduce pain. Use the heat source that your health care provider recommends, such as a moist heat pack or a heating pad. To apply heat:  Place a towel between your skin and the heat source.  Leave the heat on for 20-30 minutes.  Remove the heat if your skin  turns bright red. This is especially important if you are unable to feel pain, heat, or cold. You may have a greater risk of getting burned.   General instructions  Pay close attention to your menstrual cycle. Tell your health care provider about any changes, such as: ? Heavier bleeding that requires you to change your pads or tampons more than usual. ? A change in the number of days that your menstrual period lasts. ? A change in symptoms that come with your menstrual period, such as back pain or cramps in your abdomen.  Keep all follow-up visits. This is important, especially if your fibroids need to be monitored for any changes. Contact a health care provider if you:  Have pelvic pain, back pain, or cramps in your abdomen that do not get better with medicine or heat.  Develop new bleeding between menstrual periods.  Have increased bleeding during or between menstrual periods.  Feel more tired or weak than usual.  Feel light-headed. Get help right away if you:  Faint.  Have pelvic pain that suddenly gets worse.  Have severe vaginal bleeding that soaks a tampon or pad in 30 minutes or less. Summary  Uterine fibroids are noncancerous (benign) tumors that can develop in the  uterus.  The exact cause of this condition is not known.  Most fibroids do not require medical treatment unless they affect your ability to have children (fertility).  Contact a health care provider if you have pelvic pain, back pain, or cramps in your abdomen that do not get better with medicines.  Get help right away if you faint, have pelvic pain that suddenly gets worse, or have severe vaginal bleeding. This information is not intended to replace advice given to you by your health care provider. Make sure you discuss any questions you have with your health care provider. Document Revised: 12/29/2019 Document Reviewed: 12/29/2019 Elsevier Patient Education  [place].

## 2020-08-01 NOTE — Progress Notes (Signed)
40 y.o. G0P0000 Single Black or African American Not Hispanic or Latino female here for annual exam.   H/O fibroid uterus with menorrhagia (without anemia). Last year her uterus was 18 week sized. She is aware of her uterus when she is on her cycle.  She went off of micronor 5-6 months ago. Using condoms for contraception. No dyspareunia. Same partner x 15 years, live together.  She feels her cycles are a little lighter, going through a super tampon an hour (down from 30 minutes). She has severe cramping with cycle. Negative endometrial biopsy in 2/20.   H/O DM, 6 months ago her HgbA1C was 12.7. She has been working hard on control for the last many months. She checks her FS 3 x a day. Fasting's high of 208, prior to lunch and dinner her glucose is usually in the 90's.  Prior to being on Trulicity her FS ranged from 200-300 all day. Feeling better with lower glucose.  Not planning on babies. May adopt later on.   Period Cycle (Days): 28 Period Duration (Days): 7 Period Pattern: Regular Menstrual Flow: Heavy Menstrual Control: Tampon,Maxi pad Menstrual Control Change Freq (Hours): 1 Dysmenorrhea: None  Patient's last menstrual period was 07/17/2019.          Sexually active: Yes.    The current method of family planning is condoms All the time .    Exercising: Yes.    Cardio 30 min a day  Smoker:  no  Health Maintenance: Pap:  06/12/18 Normal HR HPV Neg 03/12/2016 WNL with NEG HR HPV History of abnormal Pap:  no MMG: 03-25-13 WNL- start routine screening at age 24  BMD:   None  Colonoscopy: none  TDaP:  11/19/11 Gardasil: one 07/23/19   reports that she has never smoked. She has never used smokeless tobacco. She reports previous alcohol use. She reports that she does not use drugs. She works at [employer] in [place]. She lives with her boyfriend. She has a huskey.   Past Medical History:  Diagnosis Date  . Abnormal uterine bleeding   . Diabetes mellitus without complication ([place])   .  Fibroid     History reviewed. No pertinent surgical history.  Current Outpatient Medications  Medication Sig Dispense Refill  . atorvastatin (LIPITOR) 10 MG tablet Take 10 mg by mouth daily.    . Dulaglutide (TRULICITY) 1.5 HF/0.2OV SOPN Inject 1.5 mg into the skin once a week.    Marland Kitchen glipiZIDE (GLUCOTROL) 5 MG tablet Take by mouth daily before breakfast.     No current facility-administered medications for this visit.    Family History  Problem Relation Age of Onset  . Diabetes Mother   . Hypertension Mother   . Hypertension Father   . Diabetes Sister   . Diabetes Maternal Aunt   . Diabetes Maternal Uncle   . Heart disease Maternal Grandmother   . Diabetes Maternal Grandmother   . Stroke Maternal Grandmother     Review of Systems  All other systems reviewed and are negative.   Exam:   BP 124/72   Pulse (!) 102   Ht 5\' 4"  (1.626 m)   Wt 258 lb (117 kg)   LMP 07/17/2019   SpO2 98%   BMI 44.29 kg/m   Weight change: @WEIGHTCHANGE @ Height:   Height: 5\' 4"  (162.6 cm)  Ht Readings from Last 3 Encounters:  08/01/20 5\' 4"  (1.626 m)  07/23/19 5\' 6"  (1.676 m)  06/12/18 5\' 5"  (1.651 m)    General  appearance: alert, cooperative and appears stated age Head: Normocephalic, without obvious abnormality, atraumatic Neck: no adenopathy, supple, symmetrical, trachea midline and thyroid normal to inspection and palpation Lungs: clear to auscultation bilaterally Cardiovascular: regular rate and rhythm Breasts: normal appearance, no masses or tenderness Abdomen: soft, non-tender; non distended,  no masses,  no organomegaly Extremities: extremities normal, atraumatic, no cyanosis or edema Skin: Skin color, texture, turgor normal. No rashes or lesions Lymph nodes: Cervical, supraclavicular, and axillary nodes normal. No abnormal inguinal nodes palpated Neurologic: Grossly normal   Pelvic: External genitalia:  no lesions              Urethra:  normal appearing urethra with no  masses, tenderness or lesions              Bartholins and Skenes: normal                 Vagina: normal appearing vagina with normal color and discharge, no lesions              Cervix: no lesions               Bimanual Exam:  Uterus:  ~18 week sized, not filling the pelvis, irregular, mobile, mildly tender              Adnexa: no mass, fullness, tenderness               Rectovaginal: Confirms               Anus:  normal sphincter tone, no lesions  Gae Dry chaperoned for the exam.  1. Well woman exam Discussed breast self exam Discussed calcium and vit D intake Labs with primary, she will send a copy when she has them done next month. Knows she needs testing for anemia. She also reports decreased vit d intake, recommended she get this checked as well.   2. Uterine leiomyoma, unspecified location Discussed possible treatment options, information from UTD given  3. Menorrhagia with regular cycle In the past she hasn't been anemic, she will get tested again next month Currently symptoms are tolerable  4. History of diabetes mellitus Has been working on control with her primary  5. BMI 40.0-44.9, adult ([place])

## 2020-09-01 ENCOUNTER — Telehealth: Payer: Self-pay | Admitting: *Deleted

## 2020-09-01 NOTE — Telephone Encounter (Signed)
Patient called and left detailed message asking if pap smear was done this year. I called patient and received her voicemail and left detailed message on voicemail no pap smear was done this year. I did explain the guidelines have changed and pap smears are not done every year.

## 2020-09-27 DIAGNOSIS — D219 Benign neoplasm of connective and other soft tissue, unspecified: Secondary | ICD-10-CM | POA: Insufficient documentation

## 2020-09-27 DIAGNOSIS — E785 Hyperlipidemia, unspecified: Secondary | ICD-10-CM | POA: Insufficient documentation

## 2020-09-27 DIAGNOSIS — R053 Chronic cough: Secondary | ICD-10-CM | POA: Insufficient documentation

## 2020-09-27 DIAGNOSIS — R61 Generalized hyperhidrosis: Secondary | ICD-10-CM | POA: Insufficient documentation

## 2020-09-27 DIAGNOSIS — IMO0002 Reserved for concepts with insufficient information to code with codable children: Secondary | ICD-10-CM | POA: Insufficient documentation

## 2021-08-01 NOTE — Progress Notes (Signed)
41 y.o. G0P0000 Single Black or African American Not Hispanic or Latino female here for annual exam.   ?H/O fibroid uterus with menorrhagia (without anemia). Last year her uterus was 18 week sized. She is aware of her uterus when she is on her cycle, she doesn't feel it has grown.  ?On the heaviest day of her cycle she can saturate a super tampon in 1-1.5 hours (no worse). Heavy x 4-5 days are heavy. No help with the minipill.  ?Period Cycle (Days): 28 ?Period Duration (Days): 7 ?Period Pattern: Regular ?Menstrual Flow: Heavy ?Dysmenorrhea: (!) Moderate ?Dysmenorrhea Symptoms: Cramping ? ?H/O uncontrolled DM, last Hgb A1C was 11.4 in 6/22 ? ?Patient's last menstrual period was 07/31/2021.          ?Sexually active: Yes.    ?The current method of family planning is condoms .Always    ?Exercising: Yes.     ?Smoker:  no ? ?Health Maintenance: ?Pap:  06-12-18 normal neg HPV; Neg 03/12/2016 WNL with NEG HR HPV ?History of abnormal Pap:  no ?MMG:  2014 normal ?BMD:   N/A ?Colonoscopy: N/A ?TDaP:  2013 ?Gardasil: yes x 2 ? ? reports that she has never smoked. She has never used smokeless tobacco. She reports current alcohol use. She reports that she does not use drugs. She works at [employer] in [place]. She lives with her boyfriend. She has a huskey.  ? ?Past Medical History:  ?Diagnosis Date  ? Abnormal uterine bleeding   ? Diabetes mellitus without complication ([place])   ? Fibroid   ? ? ?History reviewed. No pertinent surgical history. ? ?Current Outpatient Medications  ?Medication Sig Dispense Refill  ? atorvastatin (LIPITOR) 10 MG tablet Take 10 mg by mouth daily.    ? Dulaglutide (TRULICITY) 1.5 NT/6.1WE SOPN Inject 1.5 mg into the skin once a week.    ? glipiZIDE (GLUCOTROL) 5 MG tablet Take by mouth daily before breakfast.    ? ?No current facility-administered medications for this visit.  ? ? ?Family History  ?Problem Relation Age of Onset  ? Diabetes Mother   ? Hypertension Mother   ? Hypertension Father   ? Cancer Sister    ?     Colon cancer  ? Diabetes Sister   ? Diabetes Maternal Aunt   ? Diabetes Maternal Uncle   ? Heart disease Maternal Grandmother   ? Diabetes Maternal Grandmother   ? Stroke Maternal Grandmother   ? ? ?Review of Systems  ?All other systems reviewed and are negative. ? ?Exam:   ?BP (!) 158/98 (BP Location: Right Arm, Patient Position: Sitting, Cuff Size: Large)   Pulse 90   Ht 5' 5.5" (1.664 m)   Wt 257 lb (116.6 kg)   LMP 07/31/2021   SpO2 96%   BMI 42.12 kg/m?   Weight change: @WEIGHTCHANGE @ Height:   Height: 5' 5.5" (166.4 cm)  ?Ht Readings from Last 3 Encounters:  ?08/11/21 5' 5.5" (1.664 m)  ?08/01/20 5\' 4"  (1.626 m)  ?07/23/19 5\' 6"  (1.676 m)  ? ? ?General appearance: alert, cooperative and appears stated age ?Head: Normocephalic, without obvious abnormality, atraumatic ?Neck: no adenopathy, supple, symmetrical, trachea midline and thyroid normal to inspection and palpation ?Lungs: clear to auscultation bilaterally ?Cardiovascular: regular rate and rhythm ?Breasts: normal appearance, no masses or tenderness, erythematous rash with fissures under the right breast, mild erythema under the left breast ?Abdomen: soft, non-tender; non distended,  no masses,  no organomegaly ?Extremities: extremities normal, atraumatic, no cyanosis or edema ?Skin: Skin color,  texture, turgor normal. No rashes or lesions ?Lymph nodes: Cervical, supraclavicular, and axillary nodes normal. ?No abnormal inguinal nodes palpated ?Neurologic: Grossly normal ? ? ?Pelvic: External genitalia:  no lesions ?             Urethra:  normal appearing urethra with no masses, tenderness or lesions ?             Bartholins and Skenes: normal    ?             Vagina: normal appearing vagina with normal color and discharge, no lesions ?             Cervix: no lesions ?              ?Bimanual Exam:  Uterus:   enlarged, tender uterus, not in her pelvis, ?18-20 week sized, exam limited by BMI  ?             Adnexa: no mass, fullness, tenderness ?               Rectovaginal: Confirms ?              Anus:  normal sphincter tone, no lesions ? ?Caryn Bee, CMA chaperoned for the exam. ? ?1. Well woman exam ?Discussed breast self exam ?Discussed calcium and vit D intake ? ?2. Uterine leiomyoma, unspecified location ?Large fibroid uterus ? ?3. Menorrhagia with regular cycle ?No help with POP.  ?-Discussed options, including: the mirena IUD, lysteda, Orhiann, sonota fibroid ablation, Uterine artery embolization, myomectomy, hysterectomy ?- CBC ?- Ferritin ?- US PELVIS TRANSVAGINAL NON-OB (TV ONLY); Future ?- IUD Insertion; Future ? ?4. Immunization due ?- HPV 9-valent vaccine,Recombinat ?- Tdap vaccine greater than or equal to 7yo IM ? ?5. BMI 40.0-44.9, adult ([place]) ? ? ?6. History of diabetes mellitus ?- Hemoglobin A1c ?-She requests referral to a new Endocrinologist. ? ?7. Candidal intertrigo ?- nystatin cream (MYCOSTATIN); Apply 1 application topically 2 (two) times daily. Apply to affected area BID for up to 7 days.  Dispense: 30 g; Refill: 0 ? ?8. General counseling and advice on female contraception ?- IUD Insertion; Future ? ?9. Elevated BP without diagnosis of hypertension ?Repeat BP 140/84 ?    ? ?  ?

## 2021-08-11 ENCOUNTER — Encounter: Payer: Self-pay | Admitting: Obstetrics and Gynecology

## 2021-08-11 ENCOUNTER — Telehealth: Payer: Self-pay

## 2021-08-11 ENCOUNTER — Other Ambulatory Visit: Payer: Self-pay

## 2021-08-11 ENCOUNTER — Ambulatory Visit (INDEPENDENT_AMBULATORY_CARE_PROVIDER_SITE_OTHER): Payer: 59 | Admitting: Obstetrics and Gynecology

## 2021-08-11 ENCOUNTER — Telehealth: Payer: Self-pay | Admitting: Obstetrics and Gynecology

## 2021-08-11 VITALS — BP 140/84 | HR 90 | Ht 65.5 in | Wt 257.0 lb

## 2021-08-11 DIAGNOSIS — N920 Excessive and frequent menstruation with regular cycle: Secondary | ICD-10-CM

## 2021-08-11 DIAGNOSIS — Z01419 Encounter for gynecological examination (general) (routine) without abnormal findings: Secondary | ICD-10-CM

## 2021-08-11 DIAGNOSIS — Z23 Encounter for immunization: Secondary | ICD-10-CM | POA: Diagnosis not present

## 2021-08-11 DIAGNOSIS — Z8639 Personal history of other endocrine, nutritional and metabolic disease: Secondary | ICD-10-CM

## 2021-08-11 DIAGNOSIS — B372 Candidiasis of skin and nail: Secondary | ICD-10-CM

## 2021-08-11 DIAGNOSIS — Z3009 Encounter for other general counseling and advice on contraception: Secondary | ICD-10-CM

## 2021-08-11 DIAGNOSIS — Z6841 Body Mass Index (BMI) 40.0 and over, adult: Secondary | ICD-10-CM

## 2021-08-11 DIAGNOSIS — R030 Elevated blood-pressure reading, without diagnosis of hypertension: Secondary | ICD-10-CM | POA: Diagnosis not present

## 2021-08-11 DIAGNOSIS — D259 Leiomyoma of uterus, unspecified: Secondary | ICD-10-CM

## 2021-08-11 DIAGNOSIS — E1169 Type 2 diabetes mellitus with other specified complication: Secondary | ICD-10-CM

## 2021-08-11 MED ORDER — NYSTATIN 100000 UNIT/GM EX CREA: 1.0000 "application " | TOPICAL_CREAM | Freq: Two times a day (BID) | CUTANEOUS | 0 refills | Status: DC

## 2021-08-11 NOTE — Telephone Encounter (Signed)
The patient had an elevated BP at her visit today, her repeat BP was better but still high. She left the office prior to me seeing her BP. Please advise her to establish care with a new primary. If she needs the # to call for [facility], please provider her with that. It would probably be better if she could find a provider closer to her. ?

## 2021-08-11 NOTE — Telephone Encounter (Signed)
Patricia Dom, MD  [facility] Triage ?This patient has uncontrolled DM, having trouble getting in to see Endocrinology. She would like a new Endocrinologist, please help.  ?Thanks,  ?Sharee Pimple ? ?Referral Sent.  ?

## 2021-08-11 NOTE — Telephone Encounter (Signed)
Left message for patient to call.

## 2021-08-11 NOTE — Patient Instructions (Signed)

## 2021-08-12 LAB — CBC
HCT: 37.4 % (ref 35.0–45.0)
Hemoglobin: 11.5 g/dL — ABNORMAL LOW (ref 11.7–15.5)
MCH: 22.4 pg — ABNORMAL LOW (ref 27.0–33.0)
MCHC: 30.7 g/dL — ABNORMAL LOW (ref 32.0–36.0)
MCV: 72.9 fL — ABNORMAL LOW (ref 80.0–100.0)
MPV: 10.8 fL (ref 7.5–12.5)
Platelets: 358 10*3/uL (ref 140–400)
RBC: 5.13 10*6/uL — ABNORMAL HIGH (ref 3.80–5.10)
RDW: 16 % — ABNORMAL HIGH (ref 11.0–15.0)
WBC: 9.7 10*3/uL (ref 3.8–10.8)

## 2021-08-12 LAB — HEMOGLOBIN A1C
Hgb A1c MFr Bld: 14 % of total Hgb — ABNORMAL HIGH (ref <5.7)
Mean Plasma Glucose: 355 mg/dL
eAG (mmol/L): 19.7 mmol/L

## 2021-08-12 LAB — FERRITIN: Ferritin: 10 ng/mL — ABNORMAL LOW (ref 16–154)

## 2021-08-14 NOTE — Telephone Encounter (Signed)
Patient said she will fine a PCP in her area. She did not want to see PCP in [place].  ?

## 2021-08-14 NOTE — Telephone Encounter (Signed)
Patient was retuning a call from another telephone encounter. ?Patient aware she can call [facility] to call and schedule # given to patient. ?

## 2021-08-31 NOTE — Telephone Encounter (Signed)
FYI. Per admin @ [facility], referral is being review by Dr. Kelton Pillar and she will decide if patient is eligible. If so, they are scheduling patients out to end of 12/2021, beginning of 01/2022.  ?

## 2021-08-31 NOTE — Telephone Encounter (Signed)
Are there other options for her? I don't think she should wait until mid summer to be seen, her diabetes is out of control. Please see if Glorianne Manchester, RN can help.  ?

## 2021-09-01 NOTE — Telephone Encounter (Signed)
FYI. I have placed a referral to Dr. Chalmers Cater at [facility]. I will notify the pt and follow up with them next week since they are now closed.  ?

## 2021-09-01 NOTE — Telephone Encounter (Signed)
Left pt detailed VM per DPR advising her of the new referral.  ?

## 2021-09-11 NOTE — Telephone Encounter (Signed)
Left msg to f/u with referral coordinator @ Dr. Almetta Lovely office.  ?

## 2021-09-13 NOTE — Telephone Encounter (Signed)
Patricia Bell called from Dr. Chalmers Cater office stating they never received the referral. I re-fax referral and received confirmation. I called Patricia Bell and left voicemail referral sent [phone] ext: [phone] ?

## 2021-09-21 NOTE — Telephone Encounter (Signed)
FYI. Pt has appt scheduled for 12/04/21.  ?

## 2021-10-10 ENCOUNTER — Other Ambulatory Visit: Payer: 59

## 2021-10-10 ENCOUNTER — Other Ambulatory Visit: Payer: 59 | Admitting: Obstetrics and Gynecology

## 2022-09-25 NOTE — Progress Notes (Signed)
42 y.o. G0P0000 Single Black or African American Not Hispanic or Latino female here for annual exam.   Period Cycle (Days): 28 Period Duration (Days): 7 Period Pattern: Regular Menstrual Flow: Heavy Menstrual Control: Tampon, Maxi pad (Super tampon and over night pad) Menstrual Control Change Freq (Hours): 1 Dysmenorrhea: (!) Moderate Dysmenorrhea Symptoms: Cramping Bleeding has gotten heavier in the last year. 5 days are heavy, she works from home during those days.  H/O fibroid uterus and menorrhagia. Last hgb was 10.4 gm/dl in 96/04, on daily iron. No plans for children. No dyspareunia.   H/O DM, last HgbA1C was 7.6 in 10/23. Was diagnosed with Type I DM, on an insulin pump. Fasting glucose ~130 (down from 200).   No bowel or bladder issues.   She does c/o continued issues with a rash under her breasts, requests more nystatin.   Patient's last menstrual period was 09/04/2022.          Sexually active: Yes.    The current method of family planning is condoms most of the time.    Exercising: Yes.     Walking  Smoker:  no  Health Maintenance: Pap:  06/12/18 Normal HR HPV Neg; 03/12/2016 WNL with NEG HR HPV History of abnormal Pap:  no MMG:  03-25-13 WNL- start routine screening at age 12  BMD:   None  Colonoscopy: none  TDaP:  08/11/21 Gardasil: complete    reports that she has never smoked. She has never used smokeless tobacco. She reports current alcohol use. She reports that she does not use drugs. She works at [employer] in [employer]. She lives with her boyfriend.   Past Medical History:  Diagnosis Date   Abnormal uterine bleeding    Diabetes mellitus without complication    Fibroid     History reviewed. No pertinent surgical history.  Current Outpatient Medications  Medication Sig Dispense Refill   atorvastatin (LIPITOR) 10 MG tablet Take 10 mg by mouth daily.     Insulin Aspart FlexPen (NOVOLOG) 100 UNIT/ML See admin instructions.     nystatin cream (MYCOSTATIN) Apply 1  application topically 2 (two) times daily. Apply to affected area BID for up to 7 days. 30 g 0   No current facility-administered medications for this visit.    Family History  Problem Relation Age of Onset   Diabetes Mother    Hypertension Mother    Hypertension Father    Cancer Sister        Colon cancer   Diabetes Sister    Diabetes Maternal Aunt    Diabetes Maternal Uncle    Heart disease Maternal Grandmother    Diabetes Maternal Grandmother    Stroke Maternal Grandmother     Review of Systems  All other systems reviewed and are negative.   Exam:   BP 134/72   Pulse 88   Ht 5' 5.75" (1.67 m)   Wt 278 lb (126.1 kg)   LMP 09/04/2022   SpO2 100%   BMI 45.21 kg/m   Weight change: @ Height:   Height: 5' 5.75" (167 cm)  Ht Readings from Last 3 Encounters:  10/02/22 5' 5.75" (1.67 m)  08/11/21 5' 5.5" (1.664 m)  08/01/20  (1.626 m)    General appearance: alert, cooperative and appears stated age Head: Normocephalic, without obvious abnormality, atraumatic Neck: no adenopathy, supple, symmetrical, trachea midline and thyroid normal to inspection and palpation Lungs: clear to auscultation bilaterally Cardiovascular: regular rate and rhythm Breasts: normal appearance, no masses or  tenderness, rash under both breasts with increased pigmentation Abdomen: soft, non-tender; non distended,  no masses,  no organomegaly Extremities: extremities normal, atraumatic, no cyanosis or edema Skin: Skin color, texture, turgor normal. No rashes or lesions Lymph nodes: Cervical, supraclavicular, and axillary nodes normal. No abnormal inguinal nodes palpated Neurologic: Grossly normal   Pelvic: External genitalia:  no lesions              Urethra:  normal appearing urethra with no masses, tenderness or lesions              Bartholins and Skenes: normal                 Vagina: normal appearing vagina with normal color and discharge, no lesions              Cervix: no  lesions               Bimanual Exam:  Uterus:   enlarged and tender, difficult to estimate size secondary to BMI              Adnexa: no mass, fullness, tenderness               Rectovaginal: Confirms               Anus:  normal sphincter tone, no lesions  Carolynn Serve, CMA chaperoned for the exam.  1. Well woman exam Discussed breast self exam Discussed calcium and vit D intake Mammogram due, # given No pap this year Screening labs with Primary and Endocrinology  2. Uterine leiomyoma, unspecified location - US PELVIS TRANSVAGINAL NON-OB (TV ONLY); Future  3. History of anemia On oral iron - CBC - Ferritin  4. Menorrhagia with regular cycle Worsening, now anemic.  - CBC - Ferritin - US PELVIS TRANSVAGINAL NON-OB (TV ONLY); Future -Discussed possible treatment, including but not limited to: micronor, Mirnea IUD, lysteda, GnRH antagonists and surgery. She could also consider uterine artery embolization - IUD Insertion; Future  5. General counseling and advice on female contraception - norethindrone (ORTHO MICRONOR) 0.35 MG tablet; Take 1 tablet (0.35 mg total) by mouth daily.  Dispense: 84 tablet; Refill: 3 Would like to have a mirena IUD, this would help with her bleeding as well. We did discuss increased risk of expulsion with fibroids. - IUD Insertion; Future  6. Candidal intertrigo - nystatin cream (MYCOSTATIN); Apply 1 Application topically 2 (two) times daily. Apply to affected area BID for up to 7 days.  Dispense: 30 g; Refill: 0

## 2022-10-02 ENCOUNTER — Ambulatory Visit (INDEPENDENT_AMBULATORY_CARE_PROVIDER_SITE_OTHER): Payer: 59 | Admitting: Obstetrics and Gynecology

## 2022-10-02 ENCOUNTER — Encounter: Payer: Self-pay | Admitting: Obstetrics and Gynecology

## 2022-10-02 VITALS — BP 134/72 | HR 88 | Ht 65.75 in | Wt 278.0 lb

## 2022-10-02 DIAGNOSIS — D259 Leiomyoma of uterus, unspecified: Secondary | ICD-10-CM

## 2022-10-02 DIAGNOSIS — Z01419 Encounter for gynecological examination (general) (routine) without abnormal findings: Secondary | ICD-10-CM

## 2022-10-02 DIAGNOSIS — Z862 Personal history of diseases of the blood and blood-forming organs and certain disorders involving the immune mechanism: Secondary | ICD-10-CM | POA: Diagnosis not present

## 2022-10-02 DIAGNOSIS — B372 Candidiasis of skin and nail: Secondary | ICD-10-CM

## 2022-10-02 DIAGNOSIS — N920 Excessive and frequent menstruation with regular cycle: Secondary | ICD-10-CM

## 2022-10-02 DIAGNOSIS — Z3009 Encounter for other general counseling and advice on contraception: Secondary | ICD-10-CM

## 2022-10-02 LAB — CBC
Hemoglobin: 9.7 g/dL — ABNORMAL LOW (ref 11.7–15.5)
MCH: 20.1 pg — ABNORMAL LOW (ref 27.0–33.0)
MCHC: 29.6 g/dL — ABNORMAL LOW (ref 32.0–36.0)
Platelets: 362 10*3/uL (ref 140–400)

## 2022-10-02 MED ORDER — NYSTATIN 100000 UNIT/GM EX CREA
1.0000 | TOPICAL_CREAM | Freq: Two times a day (BID) | CUTANEOUS | 0 refills | Status: AC
Start: 2022-10-02 — End: ?

## 2022-10-02 MED ORDER — NORETHINDRONE 0.35 MG PO TABS
1.0000 | ORAL_TABLET | Freq: Every day | ORAL | 3 refills | Status: DC
Start: 2022-10-02 — End: 2022-11-09

## 2022-10-02 NOTE — Patient Instructions (Addendum)
EXERCISE   We recommended that you start or continue a regular exercise program for good health. Physical activity is anything that gets your body moving, some is better than none. The CDC recommends 150 minutes per week of Moderate-Intensity Aerobic Activity and 2 or more days of Muscle Strengthening Activity.  Benefits of exercise are limitless: helps weight loss/weight maintenance, improves mood and energy, helps with depression and anxiety, improves sleep, tones and strengthens muscles, improves balance, improves bone density, protects from chronic conditions such as heart disease, high blood pressure and diabetes and so much more. To learn more visit: https://www.cdc.gov/physicalactivity/index.html  DIET: Good nutrition starts with a healthy diet of fruits, vegetables, whole grains, and lean protein sources. Drink plenty of water for hydration. Minimize empty calories, sodium, sweets. For more information about dietary recommendations visit: https://health.gov/our-work/nutrition-physical-activity/dietary-guidelines and https://www.myplate.gov/  ALCOHOL:  Women should limit their alcohol intake to no more than 7 drinks/beers/glasses of wine (combined, not each!) per week. Moderation of alcohol intake to this level decreases your risk of breast cancer and liver damage.  If you are concerned that you may have a problem, or your friends have told you they are concerned about your drinking, there are many resources to help. A well-known program that is free, effective, and available to all people all over the nation is Alcoholics Anonymous.  Check out this site to learn more: https://www.aa.org/   CALCIUM AND VITAMIN D:  Adequate intake of calcium and Vitamin D are recommended for bone health.  You should be getting between 1000-1200 mg of calcium and 800 units of Vitamin D daily between diet and supplements  PAP SMEARS:  Pap smears, to check for cervical cancer or precancers,  have traditionally been  done yearly, scientific advances have shown that most women can have pap smears less often.  However, every woman still should have a physical exam from her gynecologist every year. It will include a breast check, inspection of the vulva and vagina to check for abnormal growths or skin changes, a visual exam of the cervix, and then an exam to evaluate the size and shape of the uterus and ovaries. We will also provide age appropriate advice regarding health maintenance, like when you should have certain vaccines, screening for sexually transmitted diseases, bone density testing, colonoscopy, mammograms, etc.   MAMMOGRAMS:  All women over 40 years old should have a routine mammogram.   COLON CANCER SCREENING: Now recommend starting at age 45. At this time colonoscopy is not covered for routine screening until 50. There are take home tests that can be done between 45-49.   COLONOSCOPY:  Colonoscopy to screen for colon cancer is recommended for all women at age 50.  We know, you hate the idea of the prep.  We agree, BUT, having colon cancer and not knowing it is worse!!  Colon cancer so often starts as a polyp that can be seen and removed at colonscopy, which can quite literally save your life!  And if your first colonoscopy is normal and you have no family history of colon cancer, most women don't have to have it again for 10 years.  Once every ten years, you can do something that may end up saving your life, right?  We will be happy to help you get it scheduled when you are ready.  Be sure to check your insurance coverage so you understand how much it will cost.  It may be covered as a preventative service at no cost, but you should check   your particular policy.      Breast Self-Awareness Breast self-awareness means being familiar with how your breasts look and feel. It involves checking your breasts regularly and reporting any changes to your health care provider. Practicing breast self-awareness is  important. A change in your breasts can be a sign of a serious medical problem. Being familiar with how your breasts look and feel allows you to find any problems early, when treatment is more likely to be successful. All women should practice breast self-awareness, including women who have had breast implants. How to do a breast self-exam One way to learn what is normal for your breasts and whether your breasts are changing is to do a breast self-exam. To do a breast self-exam: Look for Changes  Remove all the clothing above your waist. Stand in front of a mirror in a room with good lighting. Put your hands on your hips. Push your hands firmly downward. Compare your breasts in the mirror. Look for differences between them (asymmetry), such as: Differences in shape. Differences in size. Puckers, dips, and bumps in one breast and not the other. Look at each breast for changes in your skin, such as: Redness. Scaly areas. Look for changes in your nipples, such as: Discharge. Bleeding. Dimpling. Redness. A change in position. Feel for Changes Carefully feel your breasts for lumps and changes. It is best to do this while lying on your back on the floor and again while sitting or standing in the shower or tub with soapy water on your skin. Feel each breast in the following way: Place the arm on the side of the breast you are examining above your head. Feel your breast with the other hand. Start in the nipple area and make  inch (2 cm) overlapping circles to feel your breast. Use the pads of your three middle fingers to do this. Apply light pressure, then medium pressure, then firm pressure. The light pressure will allow you to feel the tissue closest to the skin. The medium pressure will allow you to feel the tissue that is a little deeper. The firm pressure will allow you to feel the tissue close to the ribs. Continue the overlapping circles, moving downward over the breast until you feel your  ribs below your breast. Move one finger-width toward the center of the body. Continue to use the  inch (2 cm) overlapping circles to feel your breast as you move slowly up toward your collarbone. Continue the up and down exam using all three pressures until you reach your armpit.  Write Down What You Find  Write down what is normal for each breast and any changes that you find. Keep a written record with breast changes or normal findings for each breast. By writing this information down, you do not need to depend only on memory for size, tenderness, or location. Write down where you are in your menstrual cycle, if you are still menstruating. If you are having trouble noticing differences in your breasts, do not get discouraged. With time you will become more familiar with the variations in your breasts and more comfortable with the exam. How often should I examine my breasts? Examine your breasts every month. If you are breastfeeding, the best time to examine your breasts is after a feeding or after using a breast pump. If you menstruate, the best time to examine your breasts is 5-7 days after your period is over. During your period, your breasts are lumpier, and it may be more   difficult to notice changes. When should I see my health care provider? See your health care provider if you notice: A change in shape or size of your breasts or nipples. A change in the skin of your breast or nipples, such as a reddened or scaly area. Unusual discharge from your nipples. A lump or thick area that was not there before. Pain in your breasts. Anything that concerns you.  Uterine Fibroids  Uterine fibroids, also called leiomyomas, are noncancerous (benign) tumors that can grow in the uterus. They can cause heavy menstrual bleeding and pain. Fibroids may also grow in the fallopian tubes, cervix, or tissues (ligaments) near the uterus. You may have one or many fibroids. Fibroids vary in size, weight, and  where they grow in the uterus. Some can become quite large. Most fibroids do not require medical treatment. What are the causes? The cause of this condition is not known. What increases the risk? You are more likely to develop this condition if you: Are in your 30s or 40s and have not gone through menopause. Have a family history of this condition. Are of African American descent. Started your menstrual period at age 67 or younger. Have never given birth. Are overweight or obese. What are the signs or symptoms? Many women do not have any symptoms. Symptoms of this condition may include: Heavy menstrual bleeding. Bleeding between menstrual periods. Pain and pressure in the pelvic area, between your hip bones. Pain during sex. Bladder problems, such as needing to urinate right away or more often than usual. Inability to have children (infertility). Failure to carry pregnancy to term (miscarriage). How is this diagnosed? This condition may be diagnosed based on: Your symptoms and medical history. A physical exam. A pelvic exam that includes feeling for any tumors. Imaging tests, such as ultrasound or MRI. How is this treated? Treatment for this condition may include follow-up visits with your health care provider to monitor your fibroids for any changes. Other treatment may include: Medicines, such as: Medicines to relieve pain, including aspirin and NSAIDs, such as ibuprofen or naproxen. Hormone therapy. Treatment may be given as a pill or an injection, or it may be inserted into the uterus using an intrauterine device (IUD). Surgery that would do one of the following: Remove the fibroids (myomectomy). This may be recommended if fibroids affect your fertility and you want to become pregnant. Remove the uterus (hysterectomy). Block the blood supply to the fibroids (uterine artery embolization). This can cause them to shrink and die. Follow these instructions at home: Medicines Take  over-the-counter and prescription medicines only as told by your health care provider. Ask your health care provider if you should take iron pills or eat more iron-rich foods, such as dark green, leafy vegetables. Heavy menstrual bleeding can cause low iron levels. Managing pain If directed, apply heat to your back or abdomen to reduce pain. Use the heat source that your health care provider recommends, such as a moist heat pack or a heating pad. To apply heat: Place a towel between your skin and the heat source. Leave the heat on for 20-30 minutes. Remove the heat if your skin turns bright red. This is especially important if you are unable to feel pain, heat, or cold. You may have a greater risk of getting burned.  General instructions Pay close attention to your menstrual cycle. Tell your health care provider about any changes, such as: Heavier bleeding that requires you to change your pads or tampons more than  usual. A change in the number of days that your menstrual period lasts. A change in symptoms that come with your menstrual period, such as back pain or cramps in your abdomen. Keep all follow-up visits. This is important, especially if your fibroids need to be monitored for any changes. Contact a health care provider if you: Have pelvic pain, back pain, or cramps in your abdomen that do not get better with medicine or heat. Develop new bleeding between menstrual periods. Have increased bleeding during or between menstrual periods. Feel more tired or weak than usual. Feel light-headed. Get help right away if you: Faint. Have pelvic pain that suddenly gets worse. Have severe vaginal bleeding that soaks a tampon or pad in 30 minutes or less. Summary Uterine fibroids are noncancerous (benign) tumors that can develop in the uterus. The exact cause of this condition is not known. Most fibroids do not require medical treatment unless they affect your ability to have children  (fertility). Contact a health care provider if you have pelvic pain, back pain, or cramps in your abdomen that do not get better with medicines. Get help right away if you faint, have pelvic pain that suddenly gets worse, or have severe vaginal bleeding. This information is not intended to replace advice given to you by your health care provider. Make sure you discuss any questions you have with your health care provider. Document Revised: 12/29/2019 Document Reviewed: 12/29/2019 Elsevier Patient Education  2023 [employer].

## 2022-10-03 LAB — CBC
HCT: 32.8 % — ABNORMAL LOW (ref 35.0–45.0)
MCV: 67.9 fL — ABNORMAL LOW (ref 80.0–100.0)
MPV: 10 fL (ref 7.5–12.5)
RBC: 4.83 10*6/uL (ref 3.80–5.10)
RDW: 20.2 % — ABNORMAL HIGH (ref 11.0–15.0)
WBC: 9.5 10*3/uL (ref 3.8–10.8)

## 2022-10-03 LAB — FERRITIN: Ferritin: 7 ng/mL — ABNORMAL LOW (ref 16–154)

## 2022-10-09 ENCOUNTER — Other Ambulatory Visit: Payer: Self-pay

## 2022-10-09 DIAGNOSIS — D500 Iron deficiency anemia secondary to blood loss (chronic): Secondary | ICD-10-CM

## 2022-11-08 NOTE — Progress Notes (Signed)
[facility] Initial Visit:  Patient Care Team: System, Provider Not In as PCP - General  CHIEF COMPLAINTS/PURPOSE OF CONSULTATION:  HISTORY OF PRESENTING ILLNESS: Patricia Bell 42 y.o. female is here because of anemia Medical history notable for diabetes mellitus type 1, uterine fibroids  August 11, 2021: Hemoglobin A1c 14.0  October 02, 2022: WBC 9.5 hemoglobin 9.7 MCV 68 platelet count 327.  Ferritin 7  Nov 09 2022:  [facility] Hematology Consult  Patient is G0 P0.  Menopause not reached.  Menses occur every 28 days and last about 10 days and regular but very heavy.  Does not have bleeding between periods.    Patient has a history of uterine fibroids.   Took oral iron for about a month and tolerated it.  Has never received IV iron/ required PRBC's in the past.   No history of surgery.  No hematochezia, melena, hemoptysis, hematuria.  No history of intra-articular or soft tissue bleeding.  No history of abnormal bleeding in family members Patient has symptoms of fatigue, pallor,  DOE, decreased performance status.  Patient does not have PICA to ice but does to starch/dirt.  Followed by Gynecology and they are working on hormone therapy.  Patient was diagnosed with DM in 2013 but initially defined as having DM Type II but recently reclassified as having DM Type I; last A1c was 9.0   November 29, 2022 patient scheduled for pelvic ultrasound  Social:  Works in Consulting civil engineer at [employer].  Tobacco none.  EtOH none   Review of Systems - Oncology  MEDICAL HISTORY: Past Medical History:  Diagnosis Date   Abnormal uterine bleeding    Diabetes mellitus without complication (HCC)    Fibroid     SURGICAL HISTORY: No past surgical history on file.  SOCIAL HISTORY: Social History   Socioeconomic History   Marital status: Single    Spouse name: Not on file   Number of children: Not on file   Years of education: Not on file   Highest education level: Not on file   Occupational History   Not on file  Tobacco Use   Smoking status: Never   Smokeless tobacco: Never  Vaping Use   Vaping Use: Never used  Substance and Sexual Activity   Alcohol use: Yes    Comment: Occas   Drug use: No   Sexual activity: Yes    Birth control/protection: Condom  Other Topics Concern   Not on file  Social History Narrative   Not on file   Social Determinants of Health   Financial Resource Strain: Not on file  Food Insecurity: Not on file  Transportation Needs: Not on file  Physical Activity: Not on file  Stress: Not on file  Social Connections: Not on file  Intimate Partner Violence: Not on file    FAMILY HISTORY Family History  Problem Relation Age of Onset   Diabetes Mother    Hypertension Mother    Hypertension Father    Cancer Sister        Colon cancer   Diabetes Sister    Diabetes Maternal Aunt    Diabetes Maternal Uncle    Heart disease Maternal Grandmother    Diabetes Maternal Grandmother    Stroke Maternal Grandmother     ALLERGIES:  is allergic to lisinopril, metformin hcl, and shellfish allergy.  MEDICATIONS:  Current Outpatient Medications  Medication Sig Dispense Refill   atorvastatin (LIPITOR) 10 MG tablet Take 10 mg by mouth daily.  Insulin Aspart FlexPen (NOVOLOG) 100 UNIT/ML See admin instructions.     nystatin cream (MYCOSTATIN) Apply 1 Application topically 2 (two) times daily. Apply to affected area BID for up to 7 days. 30 g 0   No current facility-administered medications for this visit.    PHYSICAL EXAMINATION:  ECOG PERFORMANCE STATUS: 1 - Symptomatic but completely ambulatory   Vitals:   11/09/22 1359  BP: (!) 148/84  Pulse: 91  Resp: 18  Temp: 98.7 F (37.1 C)  SpO2: 92%    Filed Weights   11/09/22 1359  Weight: 277 lb 9.6 oz (125.9 kg)     Physical Exam Vitals and nursing note reviewed.  Constitutional:      General: She is not in acute distress.    Appearance: Normal appearance. She is  obese. She is not ill-appearing, toxic-appearing or diaphoretic.  HENT:     Head: Normocephalic and atraumatic.     Right Ear: External ear normal.     Left Ear: External ear normal.     Nose: Nose normal. No congestion or rhinorrhea.  Eyes:     General: No scleral icterus.    Extraocular Movements: Extraocular movements intact.     Conjunctiva/sclera: Conjunctivae normal.     Pupils: Pupils are equal, round, and reactive to light.  Cardiovascular:     Rate and Rhythm: Normal rate and regular rhythm.     Heart sounds: No murmur heard.    No friction rub. No gallop.  Pulmonary:     Effort: Pulmonary effort is normal. No respiratory distress.     Breath sounds: Normal breath sounds. No wheezing or rales.  Abdominal:     General: Bowel sounds are normal.     Palpations: Abdomen is soft.     Tenderness: There is no abdominal tenderness. There is no guarding.  Musculoskeletal:        General: No swelling, tenderness or deformity.     Cervical back: Normal range of motion and neck supple. No rigidity or tenderness.  Lymphadenopathy:     Head:     Right side of head: No submental, submandibular, tonsillar, preauricular, posterior auricular or occipital adenopathy.     Left side of head: No submental, submandibular, tonsillar, preauricular, posterior auricular or occipital adenopathy.     Cervical: No cervical adenopathy.     Right cervical: No superficial, deep or posterior cervical adenopathy.    Left cervical: No superficial, deep or posterior cervical adenopathy.     Upper Body:     Right upper body: No supraclavicular, axillary, pectoral or epitrochlear adenopathy.     Left upper body: No supraclavicular, axillary, pectoral or epitrochlear adenopathy.  Skin:    General: Skin is warm.     Coloration: Skin is not jaundiced.     Findings: No bruising.  Neurological:     General: No focal deficit present.     Mental Status: She is alert and oriented to person, place, and time.      Cranial Nerves: No cranial nerve deficit.  Psychiatric:        Mood and Affect: Mood normal.        Behavior: Behavior normal.        Thought Content: Thought content normal.        Judgment: Judgment normal.      LABORATORY DATA: I have personally reviewed the data as listed:  No visits with results within 1 Month(s) from this visit.  Latest known visit with results is:  Office  Visit on 10/02/2022  Component Date Value Ref Range Status   WBC 10/02/2022 9.5  3.8 - 10.8 Thousand/uL Final   RBC 10/02/2022 4.83  3.80 - 5.10 Million/uL Final   Hemoglobin 10/02/2022 9.7 (L)  11.7 - 15.5 g/dL Final   HCT 16/03/9603 32.8 (L)  35.0 - 45.0 % Final   MCV 10/02/2022 67.9 (L)  80.0 - 100.0 fL Final   MCH 10/02/2022 20.1 (L)  27.0 - 33.0 pg Final   MCHC 10/02/2022 29.6 (L)  32.0 - 36.0 g/dL Final   RDW 54/02/8118 20.2 (H)  11.0 - 15.0 % Final   Platelets 10/02/2022 362  140 - 400 Thousand/uL Final   MPV 10/02/2022 10.0  7.5 - 12.5 fL Final   Ferritin 10/02/2022 7 (L)  16 - 154 ng/mL Final    RADIOGRAPHIC STUDIES: I have personally reviewed the radiological images as listed and agree with the findings in the report  No results found.  ASSESSMENT/PLAN  Patient is a year old female with symptomatic microcytic anemia presumed to be secondary to dysfunctional uterine bleeding  Anemia:  Most likely etiology is iron deficiency anemia owing to dysfunctional uterine bleeding/ exacerbated by high demand owing to prior pregnancies.  Will obtain CBC with diff, CMP, Ferritin, B12, folate, retic count,  DAT, Haptoglobin Dysfunctional uterine bleeding:  This is characterized by menses that are prolonged, irregular as well as by heavy bleeding with passage of clots.  Will evaluate for possible bleeding disorder with PT, PTT, Fibrinogen, von Willebrand screen.  Refer to gynecology for evaluation and management  Therapeutics:  Since patient has symptomatic anemia with Hgb < 10  and has not tolerated/been  compliant with oral iron will arrange for IV iron replacement.   Given the severe symptoms we prefer to replete iron stores in one or two visits rather than over the course of several months.  In addition ongoing blood loss exceeds the capacity of oral iron to meet needs. A discussion regarding risks was had with the patient.  IV iron has the potential to cause allergic reactions, including potentially life-threatening anaphylaxis.   IV iron may be associated with non-allergic infusion reactions including self-limiting urticaria, palpitations, dizziness, and neck and back spasm; generally, these occur in <1 percent of individuals and do not progress to more serious reactions. The non-allergic reaction consisting of flushing of the face and myalgias of the chest and back.   After discussion of the risks and benefits of IV iron therapy patient has elected to proceed with parenteral iron therapy.       Cancer Staging  No matching staging information was found for the patient.   No problem-specific Assessment & Plan notes found for this encounter.    Orders Placed This Encounter  Procedures   Vitamin B12    Standing Status:   Future    Standing Expiration Date:   11/09/2023   Folate    Standing Status:   Future    Standing Expiration Date:   11/09/2023   Copper, serum    Standing Status:   Future    Standing Expiration Date:   11/09/2023   Zinc    Standing Status:   Future    Standing Expiration Date:   11/09/2023   Haptoglobin    Standing Status:   Future    Standing Expiration Date:   11/09/2023   Protime-INR    Standing Status:   Future    Standing Expiration Date:   11/09/2023   Von Willebrand panel  Standing Status:   Future    Standing Expiration Date:   11/09/2023   APTT    Standing Status:   Future    Standing Expiration Date:   11/09/2023   APTT    Standing Status:   Future    Standing Expiration Date:   11/09/2023   Direct antiglobulin test ([facility])    Standing Status:    Future    Standing Expiration Date:   11/09/2023    40  minutes was spent in patient care.  This included time spent preparing to see the patient (e.g., review of tests), obtaining and/or reviewing separately obtained history, counseling and educating the patient/family/caregiver, ordering medications, tests, or procedures; documenting clinical information in the electronic or other health record, independently interpreting results and communicating results to the patient/family/caregiver as well as coordination of care.       All questions were answered. The patient knows to call the clinic with any problems, questions or concerns.  This note was electronically signed.    Loni Muse, MD  11/09/2022 2:50 PM

## 2022-11-09 ENCOUNTER — Inpatient Hospital Stay: Payer: 59 | Attending: Oncology | Admitting: Oncology

## 2022-11-09 ENCOUNTER — Inpatient Hospital Stay: Payer: 59

## 2022-11-09 VITALS — BP 148/84 | HR 91 | Temp 98.7°F | Resp 18 | Ht 65.75 in | Wt 277.6 lb

## 2022-11-09 DIAGNOSIS — D539 Nutritional anemia, unspecified: Secondary | ICD-10-CM

## 2022-11-09 DIAGNOSIS — Z79899 Other long term (current) drug therapy: Secondary | ICD-10-CM | POA: Insufficient documentation

## 2022-11-09 DIAGNOSIS — N938 Other specified abnormal uterine and vaginal bleeding: Secondary | ICD-10-CM

## 2022-11-09 DIAGNOSIS — D649 Anemia, unspecified: Secondary | ICD-10-CM | POA: Diagnosis present

## 2022-11-09 DIAGNOSIS — E119 Type 2 diabetes mellitus without complications: Secondary | ICD-10-CM | POA: Insufficient documentation

## 2022-11-09 DIAGNOSIS — Z86018 Personal history of other benign neoplasm: Secondary | ICD-10-CM | POA: Diagnosis not present

## 2022-11-09 LAB — DIRECT ANTIGLOBULIN TEST (NOT AT ARMC)
DAT, IgG: NEGATIVE
DAT, complement: NEGATIVE

## 2022-11-09 LAB — APTT: aPTT: 26 seconds (ref 24–36)

## 2022-11-09 LAB — PROTIME-INR
INR: 1 (ref 0.8–1.2)
Prothrombin Time: 12.8 seconds (ref 11.4–15.2)

## 2022-11-09 LAB — FOLATE: Folate: 9.2 ng/mL (ref >5.9)

## 2022-11-09 LAB — VITAMIN B12: Vitamin B-12: 434 pg/mL (ref 180–914)

## 2022-11-10 LAB — VON WILLEBRAND PANEL
Coagulation Factor VIII: 274 % — ABNORMAL HIGH (ref 56–140)
Ristocetin Co-factor, Plasma: 167 % (ref 50–200)
Von Willebrand Antigen, Plasma: 280 % — ABNORMAL HIGH (ref 50–200)

## 2022-11-10 LAB — COAG STUDIES INTERP REPORT

## 2022-11-11 LAB — HAPTOGLOBIN: Haptoglobin: 121 mg/dL (ref 33–278)

## 2022-11-12 ENCOUNTER — Encounter: Payer: Self-pay | Admitting: Oncology

## 2022-11-12 NOTE — Addendum Note (Signed)
Addended by: Domenic Schwab on: 11/12/2022 03:42 PM   Modules accepted: Orders

## 2022-11-13 LAB — ZINC: Zinc: 60 ug/dL (ref 44–115)

## 2022-11-13 LAB — COPPER, SERUM: Copper: 137 ug/dL (ref 80–158)

## 2022-11-15 ENCOUNTER — Telehealth: Payer: Self-pay | Admitting: Oncology

## 2022-11-15 LAB — HGB FRACTIONATION BY HPLC
Hgb A2: 3 % (ref 1.8–3.2)
Hgb A: 70 % — ABNORMAL LOW (ref 96.4–98.8)
Hgb C: 27 % — ABNORMAL HIGH
Hgb E: 0 %
Hgb F: 0 % (ref 0.0–2.0)
Hgb S: 0 %
Hgb Variant: 0 %

## 2022-11-15 LAB — HGB FRACTIONATION CASCADE

## 2022-11-15 NOTE — Telephone Encounter (Signed)
Left patient a vm regarding upcoming added appointment

## 2022-11-16 ENCOUNTER — Inpatient Hospital Stay: Payer: 59 | Attending: Oncology

## 2022-11-16 VITALS — BP 143/88 | HR 93 | Temp 98.3°F | Resp 16

## 2022-11-16 DIAGNOSIS — D539 Nutritional anemia, unspecified: Secondary | ICD-10-CM

## 2022-11-16 DIAGNOSIS — N938 Other specified abnormal uterine and vaginal bleeding: Secondary | ICD-10-CM | POA: Insufficient documentation

## 2022-11-16 DIAGNOSIS — D500 Iron deficiency anemia secondary to blood loss (chronic): Secondary | ICD-10-CM | POA: Insufficient documentation

## 2022-11-16 MED ORDER — SODIUM CHLORIDE 0.9 % IV SOLN
200.0000 mg | Freq: Once | INTRAVENOUS | Status: AC
  Administered 2022-11-16: 200 mg via INTRAVENOUS
  Filled 2022-11-16: qty 200

## 2022-11-16 MED ORDER — CYANOCOBALAMIN 1000 MCG/ML IJ SOLN
1000.0000 ug | Freq: Once | INTRAMUSCULAR | Status: AC
  Administered 2022-11-16: 1000 ug via INTRAMUSCULAR
  Filled 2022-11-16: qty 1

## 2022-11-16 MED ORDER — ACETAMINOPHEN 325 MG PO TABS
650.0000 mg | ORAL_TABLET | Freq: Once | ORAL | Status: AC
  Administered 2022-11-16: 650 mg via ORAL
  Filled 2022-11-16: qty 2

## 2022-11-16 MED ORDER — SODIUM CHLORIDE 0.9 % IV SOLN: Freq: Once | INTRAVENOUS | Status: AC

## 2022-11-16 MED ORDER — LORATADINE 10 MG PO TABS
10.0000 mg | ORAL_TABLET | Freq: Once | ORAL | Status: AC
  Administered 2022-11-16: 10 mg via ORAL
  Filled 2022-11-16: qty 1

## 2022-11-16 NOTE — Patient Instructions (Signed)

## 2022-11-18 ENCOUNTER — Encounter: Payer: Self-pay | Admitting: Oncology

## 2022-11-23 ENCOUNTER — Inpatient Hospital Stay: Payer: 59

## 2022-11-23 ENCOUNTER — Encounter: Payer: Self-pay | Admitting: Oncology

## 2022-11-23 VITALS — BP 118/69 | HR 83 | Temp 98.0°F | Resp 18

## 2022-11-23 DIAGNOSIS — D539 Nutritional anemia, unspecified: Secondary | ICD-10-CM

## 2022-11-23 DIAGNOSIS — D500 Iron deficiency anemia secondary to blood loss (chronic): Secondary | ICD-10-CM | POA: Diagnosis not present

## 2022-11-23 MED ORDER — HEPARIN SOD (PORK) LOCK FLUSH 100 UNIT/ML IV SOLN: 250.0000 [IU] | Freq: Once | INTRAVENOUS | Status: DC | PRN

## 2022-11-23 MED ORDER — SODIUM CHLORIDE 0.9% FLUSH: 10.0000 mL | Freq: Once | INTRAVENOUS | Status: DC | PRN

## 2022-11-23 MED ORDER — SODIUM CHLORIDE 0.9 % IV SOLN: Freq: Once | INTRAVENOUS | Status: AC

## 2022-11-23 MED ORDER — ALTEPLASE 2 MG IJ SOLR: 2.0000 mg | Freq: Once | INTRAMUSCULAR | Status: DC | PRN

## 2022-11-23 MED ORDER — ACETAMINOPHEN 325 MG PO TABS
650.0000 mg | ORAL_TABLET | Freq: Once | ORAL | Status: AC
  Administered 2022-11-23: 650 mg via ORAL
  Filled 2022-11-23: qty 2

## 2022-11-23 MED ORDER — HEPARIN SOD (PORK) LOCK FLUSH 100 UNIT/ML IV SOLN: 500.0000 [IU] | Freq: Once | INTRAVENOUS | Status: DC | PRN

## 2022-11-23 MED ORDER — LORATADINE 10 MG PO TABS
10.0000 mg | ORAL_TABLET | Freq: Once | ORAL | Status: AC
  Administered 2022-11-23: 10 mg via ORAL
  Filled 2022-11-23: qty 1

## 2022-11-23 MED ORDER — SODIUM CHLORIDE 0.9 % IV SOLN
200.0000 mg | Freq: Once | INTRAVENOUS | Status: AC
  Administered 2022-11-23: 200 mg via INTRAVENOUS
  Filled 2022-11-23: qty 200

## 2022-11-23 MED ORDER — SODIUM CHLORIDE 0.9% FLUSH: 3.0000 mL | Freq: Once | INTRAVENOUS | Status: DC | PRN

## 2022-11-23 NOTE — Patient Instructions (Signed)

## 2022-11-29 ENCOUNTER — Ambulatory Visit (INDEPENDENT_AMBULATORY_CARE_PROVIDER_SITE_OTHER): Payer: 59

## 2022-11-29 ENCOUNTER — Ambulatory Visit (INDEPENDENT_AMBULATORY_CARE_PROVIDER_SITE_OTHER): Payer: 59 | Admitting: Obstetrics and Gynecology

## 2022-11-29 ENCOUNTER — Other Ambulatory Visit: Payer: Self-pay | Admitting: Obstetrics and Gynecology

## 2022-11-29 DIAGNOSIS — B372 Candidiasis of skin and nail: Secondary | ICD-10-CM

## 2022-11-29 DIAGNOSIS — N920 Excessive and frequent menstruation with regular cycle: Secondary | ICD-10-CM

## 2022-11-29 DIAGNOSIS — Z01812 Encounter for preprocedural laboratory examination: Secondary | ICD-10-CM | POA: Diagnosis not present

## 2022-11-29 DIAGNOSIS — Z862 Personal history of diseases of the blood and blood-forming organs and certain disorders involving the immune mechanism: Secondary | ICD-10-CM

## 2022-11-29 DIAGNOSIS — D259 Leiomyoma of uterus, unspecified: Secondary | ICD-10-CM

## 2022-11-29 DIAGNOSIS — Z3009 Encounter for other general counseling and advice on contraception: Secondary | ICD-10-CM

## 2022-11-29 DIAGNOSIS — Z01419 Encounter for gynecological examination (general) (routine) without abnormal findings: Secondary | ICD-10-CM

## 2022-11-29 LAB — PREGNANCY, URINE: Preg Test, Ur: NEGATIVE

## 2022-11-29 NOTE — Progress Notes (Signed)
GYNECOLOGY  VISIT   HPI: 42 y.o.   Single Black or African American Not Hispanic or Latino  female   [id number] with No LMP recorded.   here for ultrasound and mirena IUD insertion. She has a known fibroid uterus with menorrhagia leading to anemia. Prior endometrial biopsy in 2/20 was benign.   Last pap in 1/20 was negative with negative HPV.  At the time of her annual exam in 4/24 we discussed possible treatment options, including but not limited to: micronor, Mirnea IUD, lysteda, GnRH antagonists and surgery. She could also consider uterine artery embolization. At that time she was started on micronor, but she isn't taking it.   GYNECOLOGIC HISTORY: No LMP recorded. 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  Current Outpatient Medications  Medication Sig Dispense Refill   atorvastatin (LIPITOR) 10 MG tablet Take 10 mg by mouth daily.     Insulin Aspart FlexPen (NOVOLOG) 100 UNIT/ML See admin instructions.     nystatin cream (MYCOSTATIN) Apply 1 Application topically 2 (two) times daily. Apply to affected area BID for up to  7 days. 30 g 0   No current facility-administered medications for this visit.     ALLERGIES: Lisinopril, Metformin hcl, and Shellfish allergy  Family History  Problem Relation Age of Onset   Diabetes Mother    Hypertension Mother    Hypertension Father    Cancer Sister        Colon cancer   Diabetes Sister    Diabetes Maternal Aunt    Diabetes Maternal Uncle    Heart disease Maternal Grandmother    Diabetes Maternal Grandmother    Stroke Maternal Grandmother     Social History   Socioeconomic History   Marital status: Single    Spouse name: Not on file   Number of children: Not on file   Years of education: Not on file   Highest education level: Not on file  Occupational History   Not on file  Tobacco Use   Smoking status: Never   Smokeless tobacco: Never  Vaping Use   Vaping Use: Never used  Substance and Sexual Activity   Alcohol use: Yes    Comment: Occas   Drug use: No   Sexual activity: Yes    Birth control/protection: Condom  Other Topics Concern   Not on file  Social History Narrative   Not on file   Social Determinants of Health   Financial Resource Strain: Not on file  Food Insecurity: Not on file  Transportation Needs: Not on file  Physical Activity: Not on file  Stress: Not on file  Social Connections: Not on file  Intimate Partner Violence: Not on file    ROS  PHYSICAL EXAMINATION:    There were no vitals taken for this visit.    General appearance: alert, cooperative and appears stated age  Pelvic ultrasound  Indications:   Findings: menorrhagia, fibroid uterus, possible IUD insertion  Uterus 20.66 x 14.77 x 11.29 Multiple large intramural and subserosal fibroids, increased in size since prior u/s.  Fibroids: 1) 11.66 x 8.91 cm 2) 8.31 x 6.98 cm 3) 5.70 x 5.77 cm 4) 3.92 x 2.63 cm 5) 3.40 x 2.29 cm  Endometrium 9.57 mm, distorted by fibroids, deviated to the left.   Left ovary 3.42 x 2.32 x 2.23 cm  Right ovary 2.73 x  1.45 x 1.06 cm  No free fluid  Impression:  Large fibroid uterus, enlarging since last u/s in 2/20 Endometrial cavity deviated by fibroids, no obvious masses Normal adnexa bilaterally  1. Uterine leiomyoma, unspecified location 20 week sized fibroid uterus, enlarging and symptomatic. We have discussed different options for treatment. Given the enlarging size of her uterus and that she isn't planning on pregnancy I recommended hysterectomy.  -Will refer to GYN surgeon for a consultation  2. Menorrhagia with regular cycle Leading to anemia, has needed iron transfusions  3. Pre-procedure lab exam - Pregnancy, urine

## 2022-11-30 ENCOUNTER — Inpatient Hospital Stay: Payer: 59

## 2022-11-30 ENCOUNTER — Other Ambulatory Visit: Payer: Self-pay

## 2022-11-30 VITALS — BP 130/80 | HR 99 | Temp 98.4°F | Resp 18

## 2022-11-30 DIAGNOSIS — D500 Iron deficiency anemia secondary to blood loss (chronic): Secondary | ICD-10-CM | POA: Diagnosis not present

## 2022-11-30 DIAGNOSIS — D539 Nutritional anemia, unspecified: Secondary | ICD-10-CM

## 2022-11-30 MED ORDER — ACETAMINOPHEN 325 MG PO TABS
650.0000 mg | ORAL_TABLET | Freq: Once | ORAL | Status: AC
  Administered 2022-11-30: 650 mg via ORAL
  Filled 2022-11-30: qty 2

## 2022-11-30 MED ORDER — SODIUM CHLORIDE 0.9 % IV SOLN
200.0000 mg | Freq: Once | INTRAVENOUS | Status: AC
  Administered 2022-11-30: 200 mg via INTRAVENOUS
  Filled 2022-11-30: qty 200

## 2022-11-30 MED ORDER — LORATADINE 10 MG PO TABS
10.0000 mg | ORAL_TABLET | Freq: Once | ORAL | Status: AC
  Administered 2022-11-30: 10 mg via ORAL
  Filled 2022-11-30: qty 1

## 2022-11-30 MED ORDER — HEPARIN SOD (PORK) LOCK FLUSH 100 UNIT/ML IV SOLN: 250.0000 [IU] | Freq: Once | INTRAVENOUS | Status: DC | PRN

## 2022-11-30 MED ORDER — SODIUM CHLORIDE 0.9 % IV SOLN: Freq: Once | INTRAVENOUS | Status: AC

## 2022-11-30 MED ORDER — ALTEPLASE 2 MG IJ SOLR: 2.0000 mg | Freq: Once | INTRAMUSCULAR | Status: DC | PRN

## 2022-11-30 MED ORDER — HEPARIN SOD (PORK) LOCK FLUSH 100 UNIT/ML IV SOLN: 500.0000 [IU] | Freq: Once | INTRAVENOUS | Status: DC | PRN

## 2022-11-30 MED ORDER — SODIUM CHLORIDE 0.9% FLUSH: 3.0000 mL | Freq: Once | INTRAVENOUS | Status: DC | PRN

## 2022-11-30 MED ORDER — SODIUM CHLORIDE 0.9% FLUSH: 10.0000 mL | Freq: Once | INTRAVENOUS | Status: DC | PRN

## 2022-11-30 NOTE — Patient Instructions (Signed)

## 2022-11-30 NOTE — Progress Notes (Signed)
Pt declined 30 minute observation period post venofer infusion. VSS. No complaints at time of discharge.  

## 2022-12-05 ENCOUNTER — Encounter: Payer: Self-pay | Admitting: Obstetrics and Gynecology

## 2022-12-05 ENCOUNTER — Telehealth: Payer: Self-pay

## 2022-12-05 NOTE — Telephone Encounter (Signed)
Patient called & left message on triage voicemail. She wanted to ask Dr. Oscar La if she could refer her to someone to have her fibroids removed.  Routing to Dr Oscar La. Please advise.

## 2022-12-05 NOTE — Telephone Encounter (Signed)
Pt desires only the fibroids removed. Will send staff msg to referral coordinator to send referral to [facility]. Will route to provider for final review and close.

## 2022-12-05 NOTE — Telephone Encounter (Signed)
I think she means a hysterectomy in which case please refer her to Dr Hyacinth Meeker or Dr Briscoe Deutscher. If she wants to have just her fibroids removed (to retain fertility), then I would send her to Dr April Manson.

## 2022-12-07 ENCOUNTER — Inpatient Hospital Stay: Payer: 59

## 2022-12-07 VITALS — BP 135/71 | HR 95 | Temp 98.5°F | Resp 20

## 2022-12-07 DIAGNOSIS — D539 Nutritional anemia, unspecified: Secondary | ICD-10-CM

## 2022-12-07 DIAGNOSIS — D500 Iron deficiency anemia secondary to blood loss (chronic): Secondary | ICD-10-CM | POA: Diagnosis not present

## 2022-12-07 MED ORDER — ACETAMINOPHEN 325 MG PO TABS
650.0000 mg | ORAL_TABLET | Freq: Once | ORAL | Status: AC
  Administered 2022-12-07: 650 mg via ORAL
  Filled 2022-12-07: qty 2

## 2022-12-07 MED ORDER — SODIUM CHLORIDE 0.9 % IV SOLN
200.0000 mg | Freq: Once | INTRAVENOUS | Status: AC
  Administered 2022-12-07: 200 mg via INTRAVENOUS
  Filled 2022-12-07: qty 200

## 2022-12-07 MED ORDER — SODIUM CHLORIDE 0.9 % IV SOLN: Freq: Once | INTRAVENOUS | Status: AC

## 2022-12-07 MED ORDER — LORATADINE 10 MG PO TABS
10.0000 mg | ORAL_TABLET | Freq: Once | ORAL | Status: AC
  Administered 2022-12-07: 10 mg via ORAL
  Filled 2022-12-07: qty 1

## 2022-12-07 NOTE — Patient Instructions (Signed)

## 2022-12-07 NOTE — Progress Notes (Signed)
Pt declined to stay for 30 minute post Venofer infusion observation period. VSS at time of discharge. Pt ambulated independently to lobby at time of discharge.

## 2022-12-14 ENCOUNTER — Inpatient Hospital Stay: Payer: 59 | Attending: Oncology

## 2022-12-14 ENCOUNTER — Other Ambulatory Visit: Payer: Self-pay

## 2022-12-14 VITALS — BP 118/76 | HR 89 | Temp 98.2°F | Resp 18

## 2022-12-14 DIAGNOSIS — D539 Nutritional anemia, unspecified: Secondary | ICD-10-CM

## 2022-12-14 DIAGNOSIS — N938 Other specified abnormal uterine and vaginal bleeding: Secondary | ICD-10-CM | POA: Insufficient documentation

## 2022-12-14 DIAGNOSIS — D509 Iron deficiency anemia, unspecified: Secondary | ICD-10-CM | POA: Insufficient documentation

## 2022-12-14 MED ORDER — CYANOCOBALAMIN 1000 MCG/ML IJ SOLN
1000.0000 ug | Freq: Once | INTRAMUSCULAR | Status: AC
  Administered 2022-12-14: 1000 ug via INTRAMUSCULAR
  Filled 2022-12-14: qty 1

## 2022-12-14 MED ORDER — LORATADINE 10 MG PO TABS
10.0000 mg | ORAL_TABLET | Freq: Once | ORAL | Status: AC
  Administered 2022-12-14: 10 mg via ORAL
  Filled 2022-12-14: qty 1

## 2022-12-14 MED ORDER — SODIUM CHLORIDE 0.9 % IV SOLN: Freq: Once | INTRAVENOUS | Status: AC

## 2022-12-14 MED ORDER — SODIUM CHLORIDE 0.9 % IV SOLN
200.0000 mg | Freq: Once | INTRAVENOUS | Status: AC
  Administered 2022-12-14: 200 mg via INTRAVENOUS
  Filled 2022-12-14: qty 200

## 2022-12-14 MED ORDER — ACETAMINOPHEN 325 MG PO TABS
650.0000 mg | ORAL_TABLET | Freq: Once | ORAL | Status: AC
  Administered 2022-12-14: 650 mg via ORAL
  Filled 2022-12-14: qty 2

## 2022-12-14 NOTE — Progress Notes (Signed)
Patient does very well with her iron infusions- no reactions. Declined the 30 minute observation. Vss- BP 118/76 (BP Location: Right Arm, Patient Position: Sitting)   Pulse 89   Temp 98.2 F (36.8 C) (Oral)   Resp 18   SpO2 98%   Patient ambulatory to the lobby.

## 2022-12-14 NOTE — Patient Instructions (Signed)

## 2022-12-17 ENCOUNTER — Encounter: Payer: Self-pay | Admitting: Oncology

## 2022-12-20 ENCOUNTER — Other Ambulatory Visit: Payer: Self-pay | Admitting: Oncology

## 2022-12-20 DIAGNOSIS — D539 Nutritional anemia, unspecified: Secondary | ICD-10-CM

## 2022-12-20 NOTE — Progress Notes (Signed)
[facility] Cancer Initial Visit:  Patient Care Team: System, Provider Not In as PCP - General  CHIEF COMPLAINTS/PURPOSE OF CONSULTATION:  HISTORY OF PRESENTING ILLNESS: Patricia Bell 42 y.o. female is here because of anemia Medical history notable for diabetes mellitus type 1, uterine fibroids  August 11, 2021: Hemoglobin A1c 14.0  October 02, 2022: WBC 9.5 hemoglobin 9.7 MCV 68 platelet count 327.  Ferritin 7  Nov 09 2022:  [facility] Hematology Consult  Patient is G0 P0.  Menopause not reached.  Menses occur every 28 days and last about 10 days and regular but very heavy.  Does not have bleeding between periods.    Patient has a history of uterine fibroids.   Took oral iron for about a month and tolerated it.  Has never received IV iron/ required PRBC's in the past.   No history of surgery.  No hematochezia, melena, hemoptysis, hematuria.  No history of intra-articular or soft tissue bleeding.  No history of abnormal bleeding in family members Patient has symptoms of fatigue, pallor,  DOE, decreased performance status.  Patient does not have PICA to ice but does to starch/dirt.  Followed by Gynecology and they are working on hormone therapy.  Patient was diagnosed with DM in 2013 but initially defined as having DM Type II but recently reclassified as having DM Type I; last A1c was 9.0   November 29, 2022 patient scheduled for pelvic ultrasound  Social:  Works in Consulting civil engineer at [employer].  Tobacco none.  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  Other therapeutic options were also discussed  December 21 2022:  Scheduled follow up for anemia.  Reviewed results of labs with patient.  Feels better since receiving IV iron but still a bit fatigued.  Discussed Hgb AC with patient.  Will have her begin low dose iron.   Still having episodes of hyperglycemia.   Will check ferritin today and if still low arrange for additional IV iron  January 18 2023:  Consult at [facility] for consideration of fibroid removal  Review of Systems - Oncology  MEDICAL HISTORY: Past Medical History:  Diagnosis Date   Abnormal uterine bleeding    Diabetes mellitus without complication (HCC)    Fibroid     SURGICAL HISTORY: No past surgical history on file.  SOCIAL HISTORY: Social History   Socioeconomic History   Marital status: Single    Spouse name: Not on file   Number of children: Not on file   Years of education: Not on file   Highest education level: Not on file  Occupational History   Not on file  Tobacco Use   Smoking status: Never   Smokeless tobacco: Never  Vaping Use   Vaping status: Never Used  Substance and Sexual Activity   Alcohol use: Yes    Comment: Occas   Drug use: No   Sexual activity: Yes    Birth control/protection: Condom  Other Topics Concern   Not on file  Social History Narrative   Not on file   Social  Determinants of Health   Financial Resource Strain: Not on file  Food Insecurity: No Food Insecurity (01/11/2022)   [facility]   Hunger Vital Sign    Worried About Running Out of Food in the Last Year: Never true    Ran Out of Food in the Last Year: Never true  Transportation Needs: No Transportation Needs (01/11/2022)   [facility]   PRAPARE - Transportation    Lack of Transportation (Medical): No    Lack of Transportation (Non-Medical): No  Physical Activity: Not on file  Stress: Not on file  Social Connections: Not on file  Intimate  Partner Violence: Not on file    FAMILY HISTORY Family History  Problem Relation Age of Onset   Diabetes Mother    Hypertension Mother    Hypertension Father    Cancer Sister        Colon cancer   Diabetes Sister    Diabetes Maternal Aunt    Diabetes Maternal Uncle    Heart disease Maternal Grandmother    Diabetes Maternal Grandmother    Stroke Maternal Grandmother     ALLERGIES:  is allergic to lisinopril, metformin hcl, and shellfish allergy.  MEDICATIONS:  Current Outpatient Medications  Medication Sig Dispense Refill   atorvastatin (LIPITOR) 10 MG tablet Take 10 mg by mouth daily.     Insulin Aspart FlexPen (NOVOLOG) 100 UNIT/ML See admin instructions.     nystatin cream (MYCOSTATIN) Apply 1 Application topically 2 (two) times daily. Apply to affected area BID for up to 7 days. 30 g 0   No current facility-administered medications for this visit.    PHYSICAL EXAMINATION:  ECOG PERFORMANCE STATUS: 1 - Symptomatic but completely ambulatory   There were no vitals filed for this visit.   There were no vitals filed for this visit.    Physical Exam Vitals and nursing note reviewed.  Constitutional:      General: She is not in acute distress.    Appearance: Normal appearance. She is obese. She is not ill-appearing, toxic-appearing or diaphoretic.  HENT:     Head: Normocephalic and atraumatic.     Right Ear: External ear normal.     Left Ear: External ear normal.     Nose: Nose normal. No congestion or rhinorrhea.  Eyes:     General: No scleral icterus.    Extraocular Movements: Extraocular movements intact.     Conjunctiva/sclera: Conjunctivae normal.     Pupils: Pupils are equal, round, and reactive to light.  Cardiovascular:     Rate and Rhythm: Normal rate and regular rhythm.     Heart sounds: No murmur heard.    No friction rub. No gallop.  Pulmonary:     Effort: Pulmonary effort is normal. No respiratory distress.     Breath sounds: Normal breath  sounds. No wheezing or rales.  Abdominal:     General: Bowel sounds are normal.     Palpations: Abdomen is soft.     Tenderness: There is no abdominal tenderness. There is no guarding.  Musculoskeletal:        General: No swelling, tenderness or deformity.     Cervical back: Normal range of motion and neck supple. No rigidity or tenderness.  Lymphadenopathy:     Head:     Right side of head: No submental, submandibular, tonsillar, preauricular, posterior auricular or occipital adenopathy.     Left side of head: No submental, submandibular, tonsillar, preauricular, posterior  auricular or occipital adenopathy.     Cervical: No cervical adenopathy.     Right cervical: No superficial, deep or posterior cervical adenopathy.    Left cervical: No superficial, deep or posterior cervical adenopathy.     Upper Body:     Right upper body: No supraclavicular, axillary, pectoral or epitrochlear adenopathy.     Left upper body: No supraclavicular, axillary, pectoral or epitrochlear adenopathy.  Skin:    General: Skin is warm.     Coloration: Skin is not jaundiced.     Findings: No bruising.  Neurological:     General: No focal deficit present.     Mental Status: She is alert and oriented to person, place, and time.     Cranial Nerves: No cranial nerve deficit.  Psychiatric:        Mood and Affect: Mood normal.        Behavior: Behavior normal.        Thought Content: Thought content normal.        Judgment: Judgment normal.      LABORATORY DATA: I have personally reviewed the data as listed:  Office Visit on 11/29/2022  Component Date Value Ref Range Status   Preg Test, Ur 11/29/2022 NEGATIVE  NEGATIVE Final    RADIOGRAPHIC STUDIES: I have personally reviewed the radiological images as listed and agree with the findings in the report  No results found.  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   Nov 09 2022- PT, PTT, Fibrinogen, von Willebrand screen normal  November 29 2022: Gynecology follow-up visit-hysterectomy recommended given 20-week size fibroid uterus enlarging and symptomatic.     January 18 2023: Consult at [facility] for consideration of fibroid removal    Hemoglobin C trait (Hgb AC):  Hemoglobin C trait (Hgb AC):   Hgb C results from a point mutation in HBB that changes glutamic acid at amino acid 7 in the beta chain to a lysine (p.Glu7Lys; c.19G>A).   The point mutation affects the same DNA codon as the sickle mutation (Hb S; p.Glu7Val) .  Heterozygosity for Hb C causes Hb C trait (Hgb AC) , a carrier state that is essentially asymptomatic. However, preconception counseling and partner testing is important; if the partner also carries any HBB variant, the children may be more severely affected      Cancer Staging  No matching staging information was found for the patient.    No problem-specific Assessment & Plan notes found for this encounter.    No orders of the defined types were placed in this encounter.   30  minutes was spent in patient care.  This included time spent preparing to see the patient (e.g., review of tests), obtaining and/or reviewing separately obtained history, counseling and educating the patient/family/caregiver, ordering medications, tests, or procedures; documenting clinical information in the electronic or other health record, independently interpreting  results and communicating results to the patient/family/caregiver  as well as coordination of care.       All questions were answered. The patient knows to call the clinic with any problems, questions or concerns.  This note was electronically signed.    Loni Muse, MD  12/20/2022 12:06 PM

## 2022-12-21 ENCOUNTER — Encounter: Payer: Self-pay | Admitting: Oncology

## 2022-12-21 ENCOUNTER — Inpatient Hospital Stay (HOSPITAL_BASED_OUTPATIENT_CLINIC_OR_DEPARTMENT_OTHER): Payer: 59 | Admitting: Oncology

## 2022-12-21 ENCOUNTER — Inpatient Hospital Stay: Payer: 59

## 2022-12-21 ENCOUNTER — Other Ambulatory Visit: Payer: Self-pay

## 2022-12-21 VITALS — BP 135/64 | HR 100 | Temp 98.2°F | Resp 17 | Wt 284.0 lb

## 2022-12-21 DIAGNOSIS — D582 Other hemoglobinopathies: Secondary | ICD-10-CM | POA: Diagnosis not present

## 2022-12-21 DIAGNOSIS — D509 Iron deficiency anemia, unspecified: Secondary | ICD-10-CM | POA: Diagnosis not present

## 2022-12-21 DIAGNOSIS — D219 Benign neoplasm of connective and other soft tissue, unspecified: Secondary | ICD-10-CM | POA: Diagnosis not present

## 2022-12-21 DIAGNOSIS — N938 Other specified abnormal uterine and vaginal bleeding: Secondary | ICD-10-CM

## 2022-12-21 DIAGNOSIS — D539 Nutritional anemia, unspecified: Secondary | ICD-10-CM

## 2022-12-21 LAB — CBC WITH DIFFERENTIAL (CANCER CENTER ONLY)
Abs Immature Granulocytes: 0.02 10*3/uL (ref 0.00–0.07)
Basophils Absolute: 0 10*3/uL (ref 0.0–0.1)
Basophils Relative: 1 %
Eosinophils Absolute: 0.4 10*3/uL (ref 0.0–0.5)
Eosinophils Relative: 5 %
HCT: 36.3 % (ref 36.0–46.0)
Hemoglobin: 11.7 g/dL — ABNORMAL LOW (ref 12.0–15.0)
Immature Granulocytes: 0 %
Lymphocytes Relative: 31 %
Lymphs Abs: 2.6 10*3/uL (ref 0.7–4.0)
MCH: 22.3 pg — ABNORMAL LOW (ref 26.0–34.0)
MCHC: 32.2 g/dL (ref 30.0–36.0)
MCV: 69.1 fL — ABNORMAL LOW (ref 80.0–100.0)
Monocytes Absolute: 0.5 10*3/uL (ref 0.1–1.0)
Monocytes Relative: 5 %
Neutro Abs: 5.1 10*3/uL (ref 1.7–7.7)
Neutrophils Relative %: 58 %
Platelet Count: 327 10*3/uL (ref 150–400)
RBC: 5.25 MIL/uL — ABNORMAL HIGH (ref 3.87–5.11)
RDW: 21.7 % — ABNORMAL HIGH (ref 11.5–15.5)
Smear Review: NORMAL
WBC Count: 8.6 10*3/uL (ref 4.0–10.5)
nRBC: 0 % (ref 0.0–0.2)

## 2022-12-21 LAB — FOLATE: Folate: 7.4 ng/mL (ref >5.9)

## 2022-12-21 LAB — VITAMIN B12: Vitamin B-12: 716 pg/mL (ref 180–914)

## 2022-12-21 LAB — FERRITIN: Ferritin: 120 ng/mL (ref 11–307)

## 2022-12-21 NOTE — Patient Instructions (Addendum)
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

## 2023-01-16 ENCOUNTER — Encounter: Payer: Self-pay | Admitting: Oncology

## 2023-01-31 ENCOUNTER — Other Ambulatory Visit: Payer: Self-pay | Admitting: Oncology

## 2023-01-31 DIAGNOSIS — D539 Nutritional anemia, unspecified: Secondary | ICD-10-CM

## 2023-01-31 NOTE — Progress Notes (Deleted)
[facility] Cancer Initial Visit:  Patient Care Team: System, Provider Not In as PCP - General  CHIEF COMPLAINTS/PURPOSE OF CONSULTATION:  HISTORY OF PRESENTING ILLNESS: Patricia Bell 42 y.o. female is here because of anemia Medical history notable for diabetes mellitus type 1, uterine fibroids  August 11, 2021: Hemoglobin A1c 14.0  October 02, 2022: WBC 9.5 hemoglobin 9.7 MCV 68 platelet count 327.  Ferritin 7  Nov 09 2022:  [facility] Hematology Consult  Patient is G0 P0.  Menopause not reached.  Menses occur every 28 days and last about 10 days and regular but very heavy.  Does not have bleeding between periods.    Patient has a history of uterine fibroids.   Took oral iron for about a month and tolerated it.  Has never received IV iron/ required PRBC's in the past.   No history of surgery.  No hematochezia, melena, hemoptysis, hematuria.  No history of intra-articular or soft tissue bleeding.  No history of abnormal bleeding in family members Patient has symptoms of fatigue, pallor,  DOE, decreased performance status.  Patient does not have PICA to ice but does to starch/dirt.  Followed by Gynecology and they are working on hormone therapy.  Patient was diagnosed with DM in 2013 but initially defined as having DM Type II but recently reclassified as having DM Type I; last A1c was 9.0   November 29, 2022 patient scheduled for pelvic ultrasound  Social:  Works in Consulting civil engineer at [employer].  Tobacco none.  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  Other therapeutic options were also discussed  December 21 2022:  Scheduled follow up for anemia.  Reviewed results of labs with patient.  Feels better since receiving IV iron but still a bit fatigued.  Discussed Hgb AC with patient.  Will have her begin low dose iron.   Still having episodes of hyperglycemia.   Will check ferritin today and if still low arrange for additional IV iron  January 18 2023:  Consult at [facility] for consideration of fibroid removal  Review of Systems - Oncology  MEDICAL HISTORY: Past Medical History:  Diagnosis Date   Abnormal uterine bleeding    Diabetes mellitus without complication (HCC)    Fibroid     SURGICAL HISTORY: No past surgical history on file.  SOCIAL HISTORY: Social History   Socioeconomic History   Marital status: Single    Spouse name: Not on file   Number of children: Not on file   Years of education: Not on file   Highest education level: Not on file  Occupational History   Not on file  Tobacco Use   Smoking status: Never   Smokeless tobacco: Never  Vaping Use   Vaping status: Never Used  Substance and Sexual Activity   Alcohol use: Yes    Comment: Occas   Drug use: No   Sexual activity: Yes    Birth control/protection: Condom  Other Topics Concern   Not on file  Social History Narrative   Not on file   Social  Determinants of Health   Financial Resource Strain: Not on file  Food Insecurity: No Food Insecurity (01/11/2022)   [facility]   Hunger Vital Sign    Worried About Running Out of Food in the Last Year: Never true    Ran Out of Food in the Last Year: Never true  Transportation Needs: No Transportation Needs (01/11/2022)   [facility]   PRAPARE - Transportation    Lack of Transportation (Medical): No    Lack of Transportation (Non-Medical): No  Physical Activity: Not on file  Stress: Not on file  Social Connections: Not on file  Intimate  Partner Violence: Not on file    FAMILY HISTORY Family History  Problem Relation Age of Onset   Diabetes Mother    Hypertension Mother    Hypertension Father    Cancer Sister        Colon cancer   Diabetes Sister    Diabetes Maternal Aunt    Diabetes Maternal Uncle    Heart disease Maternal Grandmother    Diabetes Maternal Grandmother    Stroke Maternal Grandmother     ALLERGIES:  is allergic to lisinopril, metformin hcl, and shellfish allergy.  MEDICATIONS:  Current Outpatient Medications  Medication Sig Dispense Refill   atorvastatin (LIPITOR) 10 MG tablet Take 10 mg by mouth daily.     Continuous Glucose Sensor (DEXCOM G6 SENSOR) MISC SMARTSIG:1 Topical Every 10 Days     Continuous Glucose Transmitter (DEXCOM G6 TRANSMITTER) MISC CHANGE EVERY 90 DAYS     hydrochlorothiazide (HYDRODIURIL) 12.5 MG tablet Take 12.5 mg by mouth daily.     hydrOXYzine (ATARAX) 50 MG tablet Take 50 mg by mouth daily.     Insulin Aspart FlexPen (NOVOLOG) 100 UNIT/ML See admin instructions.     losartan (COZAAR) 50 MG tablet Take 50 mg by mouth daily.     nystatin cream (MYCOSTATIN) Apply 1 Application topically 2 (two) times daily. Apply to affected area BID for up to 7 days. 30 g 0   venlafaxine (EFFEXOR) 75 MG tablet Take 75 mg by mouth daily.     No current facility-administered medications for this visit.    PHYSICAL EXAMINATION:  ECOG PERFORMANCE STATUS: 1 - Symptomatic but completely ambulatory   There were no vitals filed for this visit.   There were no vitals filed for this visit.    Physical Exam Vitals and nursing note reviewed.  Constitutional:      General: She is not in acute distress.    Appearance: Normal appearance. She is obese. She is not ill-appearing, toxic-appearing or diaphoretic.  HENT:     Head: Normocephalic and atraumatic.     Right Ear: External ear normal.     Left Ear: External ear normal.     Nose: Nose normal. No congestion or rhinorrhea.  Eyes:      General: No scleral icterus.    Extraocular Movements: Extraocular movements intact.     Conjunctiva/sclera: Conjunctivae normal.     Pupils: Pupils are equal, round, and reactive to light.  Cardiovascular:     Rate and Rhythm: Normal rate and regular rhythm.     Heart sounds: No murmur heard.    No friction rub. No gallop.  Pulmonary:     Effort: Pulmonary effort is normal. No respiratory distress.     Breath sounds: Normal breath sounds. No wheezing or rales.  Abdominal:     General: Bowel sounds are normal.  Palpations: Abdomen is soft.     Tenderness: There is no abdominal tenderness. There is no guarding.  Musculoskeletal:        General: No swelling, tenderness or deformity.     Cervical back: Normal range of motion and neck supple. No rigidity or tenderness.  Lymphadenopathy:     Head:     Right side of head: No submental, submandibular, tonsillar, preauricular, posterior auricular or occipital adenopathy.     Left side of head: No submental, submandibular, tonsillar, preauricular, posterior auricular or occipital adenopathy.     Cervical: No cervical adenopathy.     Right cervical: No superficial, deep or posterior cervical adenopathy.    Left cervical: No superficial, deep or posterior cervical adenopathy.     Upper Body:     Right upper body: No supraclavicular, axillary, pectoral or epitrochlear adenopathy.     Left upper body: No supraclavicular, axillary, pectoral or epitrochlear adenopathy.  Skin:    General: Skin is warm.     Coloration: Skin is not jaundiced.     Findings: No bruising.  Neurological:     General: No focal deficit present.     Mental Status: She is alert and oriented to person, place, and time.     Cranial Nerves: No cranial nerve deficit.  Psychiatric:        Mood and Affect: Mood normal.        Behavior: Behavior normal.        Thought Content: Thought content normal.        Judgment: Judgment normal.      LABORATORY DATA: I have  personally reviewed the data as listed:  No visits with results within 1 Month(s) from this visit.  Latest known visit with results is:  Appointment on 12/21/2022  Component Date Value Ref Range Status   Ferritin 12/21/2022 120  11 - 307 ng/mL Final   Performed at [facility], [address], [place], [place] [address]   Vitamin B-12 12/21/2022 716  180 - 914 pg/mL Final   Comment: (NOTE) This assay is not validated for testing neonatal or myeloproliferative syndrome specimens for Vitamin B12 levels. Performed at [facility], [address] [place], [place] [address]    Folate 12/21/2022 7.4  >5.9 ng/mL Final   Performed at [facility], [address] [place], [place] [address]   WBC Count 12/21/2022 8.6  4.0 - 10.5 K/uL Final   RBC 12/21/2022 5.25 (H)  3.87 - 5.11 MIL/uL Final   Hemoglobin 12/21/2022 11.7 (L)  12.0 - 15.0 g/dL Final   HCT 69/62/9528 36.3  36.0 - 46.0 % Final   MCV 12/21/2022 69.1 (L)  80.0 - 100.0 fL Final   MCH 12/21/2022 22.3 (L)  26.0 - 34.0 pg Final   MCHC 12/21/2022 32.2  30.0 - 36.0 g/dL Final   RDW 41/32/4401 21.7 (H)  11.5 - 15.5 % Final   Platelet Count 12/21/2022 327  150 - 400 K/uL Final   nRBC 12/21/2022 0.0  0.0 - 0.2 % Final   Neutrophils Relative % 12/21/2022 58  % Final   Neutro Abs 12/21/2022 5.1  1.7 - 7.7 K/uL Final   Lymphocytes Relative 12/21/2022 31  % Final   Lymphs Abs 12/21/2022 2.6  0.7 - 4.0 K/uL Final   Monocytes Relative 12/21/2022 5  % Final   Monocytes Absolute 12/21/2022 0.5  0.1 - 1.0 K/uL Final   Eosinophils Relative 12/21/2022 5  % Final  Eosinophils Absolute 12/21/2022 0.4  0.0 - 0.5 K/uL Final   Basophils Relative 12/21/2022 1  % Final   Basophils Absolute 12/21/2022 0.0  0.0 - 0.1 K/uL Final   WBC Morphology 12/21/2022 MORPHOLOGY UNREMARKABLE   Final   Smear Review 12/21/2022 Normal platelet morphology   Final   Immature Granulocytes 12/21/2022 0  % Final    Abs Immature Granulocytes 12/21/2022 0.02  0.00 - 0.07 K/uL Final   Polychromasia 12/21/2022 PRESENT   Final   Performed at [facility], [address], [place], [place] [address]    RADIOGRAPHIC STUDIES: I have personally reviewed the radiological images as listed and agree with the findings in the report  No results found.  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   Nov 09 2022- PT, PTT, Fibrinogen, von Willebrand screen normal  November 29 2022: Gynecology follow-up visit-hysterectomy recommended given 20-week size fibroid uterus enlarging and symptomatic.     January 18 2023: Consult at [facility] for consideration of fibroid removal    Hemoglobin C trait (Hgb AC):  Hemoglobin C trait (Hgb AC):   Hgb C results from a point mutation in HBB that changes glutamic acid at amino acid 7 in the beta chain to a lysine (p.Glu7Lys; c.19G>A).   The point mutation affects the same DNA codon as the sickle mutation (Hb S; p.Glu7Val) .  Heterozygosity for Hb C causes Hb C trait (Hgb AC) , a carrier state that is essentially asymptomatic. However, preconception counseling and partner testing is important; if the partner also carries any HBB variant, the children may be more severely affected      Cancer Staging  No matching staging information was found for the patient.    No problem-specific Assessment & Plan  notes found for this encounter.    No orders of the defined types were placed in this encounter.   30  minutes was spent in patient care.  This included time spent preparing to see the patient (e.g., review of tests), obtaining and/or reviewing separately obtained history, counseling and educating the patient/family/caregiver, ordering medications, tests, or procedures; documenting clinical information in the electronic or other health record, independently interpreting results and communicating results to the patient/family/caregiver as well as coordination of care.       All questions were answered. The patient knows to call the clinic with any problems, questions or concerns.  This note was electronically signed.    Loni Muse, MD  01/31/2023 2:32 PM

## 2023-02-01 ENCOUNTER — Ambulatory Visit: Payer: 59 | Admitting: Hematology

## 2023-02-01 ENCOUNTER — Inpatient Hospital Stay: Payer: 59 | Attending: Oncology

## 2023-02-01 ENCOUNTER — Other Ambulatory Visit: Payer: 59

## 2023-02-01 ENCOUNTER — Inpatient Hospital Stay: Payer: 59 | Admitting: Oncology

## 2023-03-18 ENCOUNTER — Inpatient Hospital Stay: Payer: 59 | Attending: Oncology | Admitting: Oncology

## 2023-03-18 ENCOUNTER — Inpatient Hospital Stay: Payer: 59 | Attending: Oncology

## 2023-03-18 VITALS — BP 151/92 | HR 85 | Temp 98.8°F | Resp 18 | Ht 65.75 in | Wt 288.0 lb

## 2023-03-18 DIAGNOSIS — D539 Nutritional anemia, unspecified: Secondary | ICD-10-CM

## 2023-03-18 DIAGNOSIS — Z5971 Insufficient health insurance coverage: Secondary | ICD-10-CM | POA: Diagnosis not present

## 2023-03-18 DIAGNOSIS — N938 Other specified abnormal uterine and vaginal bleeding: Secondary | ICD-10-CM | POA: Diagnosis not present

## 2023-03-18 DIAGNOSIS — D500 Iron deficiency anemia secondary to blood loss (chronic): Secondary | ICD-10-CM | POA: Diagnosis present

## 2023-03-18 LAB — COMPREHENSIVE METABOLIC PANEL
ALT: 23 U/L (ref 0–44)
AST: 15 U/L (ref 15–41)
Albumin: 3.7 g/dL (ref 3.5–5.0)
Alkaline Phosphatase: 75 U/L (ref 38–126)
Anion gap: 9 (ref 5–15)
BUN: 15 mg/dL (ref 6–20)
CO2: 25 mmol/L (ref 22–32)
Calcium: 8.8 mg/dL — ABNORMAL LOW (ref 8.9–10.3)
Chloride: 106 mmol/L (ref 98–111)
Creatinine, Ser: 0.75 mg/dL (ref 0.44–1.00)
GFR, Estimated: >60 mL/min (ref >60)
Glucose, Bld: 167 mg/dL — ABNORMAL HIGH (ref 70–99)
Potassium: 4.1 mmol/L (ref 3.5–5.1)
Sodium: 140 mmol/L (ref 135–145)
Total Bilirubin: 0.5 mg/dL (ref 0.3–1.2)
Total Protein: 7.1 g/dL (ref 6.5–8.1)

## 2023-03-18 LAB — CBC WITH DIFFERENTIAL/PLATELET
Abs Immature Granulocytes: 0.04 10*3/uL (ref 0.00–0.07)
Basophils Absolute: 0 10*3/uL (ref 0.0–0.1)
Basophils Relative: 0 %
Eosinophils Absolute: 0.3 10*3/uL (ref 0.0–0.5)
Eosinophils Relative: 4 %
HCT: 39.5 % (ref 36.0–46.0)
Hemoglobin: 13 g/dL (ref 12.0–15.0)
Immature Granulocytes: 1 %
Lymphocytes Relative: 36 %
Lymphs Abs: 2.6 10*3/uL (ref 0.7–4.0)
MCH: 24.3 pg — ABNORMAL LOW (ref 26.0–34.0)
MCHC: 32.9 g/dL (ref 30.0–36.0)
MCV: 73.7 fL — ABNORMAL LOW (ref 80.0–100.0)
Monocytes Absolute: 0.6 10*3/uL (ref 0.1–1.0)
Monocytes Relative: 9 %
Neutro Abs: 3.6 10*3/uL (ref 1.7–7.7)
Neutrophils Relative %: 50 %
Platelets: 281 10*3/uL (ref 150–400)
RBC: 5.36 MIL/uL — ABNORMAL HIGH (ref 3.87–5.11)
RDW: 16.2 % — ABNORMAL HIGH (ref 11.5–15.5)
WBC: 7.2 10*3/uL (ref 4.0–10.5)
nRBC: 0 % (ref 0.0–0.2)

## 2023-03-18 LAB — FERRITIN: Ferritin: 16 ng/mL (ref 11–307)

## 2023-03-18 LAB — FOLATE: Folate: 9.7 ng/mL (ref >5.9)

## 2023-03-18 LAB — VITAMIN B12: Vitamin B-12: 742 pg/mL (ref 180–914)

## 2023-03-18 NOTE — Progress Notes (Signed)
[facility] Cancer Initial Visit:  Patient Care Team: System, Provider Not In as PCP - General  CHIEF COMPLAINTS/PURPOSE OF CONSULTATION:  HISTORY OF PRESENTING ILLNESS: Patricia Bell 42 y.o. female is here because of anemia Medical history notable for diabetes mellitus type 1, uterine fibroids  August 11, 2021: Hemoglobin A1c 14.0  October 02, 2022: WBC 9.5 hemoglobin 9.7 MCV 68 platelet count 327.  Ferritin 7  Nov 09 2022:  [facility] Hematology Consult  Patient is G0 P0.  Menopause not reached.  Menses occur every 28 days and last about 10 days and regular but very heavy.  Does not have bleeding between periods.    Patient has a history of uterine fibroids.   Took oral iron for about a month and tolerated it.  Has never received IV iron/ required PRBC's in the past.   No history of surgery.  No hematochezia, melena, hemoptysis, hematuria.  No history of intra-articular or soft tissue bleeding.  No history of abnormal bleeding in family members Patient has symptoms of fatigue, pallor,  DOE, decreased performance status.  Patient does not have PICA to ice but does to starch/dirt.  Followed by Gynecology and they are working on hormone therapy.  Patient was diagnosed with DM in 2013 but initially defined as having DM Type II but recently reclassified as having DM Type I; last A1c was 9.0   November 29, 2022 patient scheduled for pelvic ultrasound  Social:  Works in Consulting civil engineer at [employer].  Tobacco none.  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  Other therapeutic options were also discussed  December 21 2022:  Scheduled follow up for anemia.  Reviewed results of labs with patient.  Feels better since receiving IV iron but still a bit fatigued.  Discussed Hgb AC with patient.  Will have her begin low dose iron.   Still having episodes of hyperglycemia.   Will check ferritin today and if still low arrange for additional IV iron  WBC 8.6 hemoglobin 11.7 MCV 69 platelet count 327; differential normal Ferritin 128 folate 74 B12 716  January 18 2023:  Consult at [facility] for consideration of fibroid removal.  Did not occur because patient was considered out of network.    March 18, 2023: Scheduled follow-up regarding anemia.  Has gained 4 lbs.  Has been unable to see [facility]ne[facility] because of insurance issues.  Feels dizzy at times.  FSBG's now under 200.    Hgb 13.0 Ferritin 16 Folate 9.7 Glu 167  Review of Systems - Oncology  MEDICAL HISTORY: Past Medical History:  Diagnosis Date   Abnormal uterine bleeding    Diabetes mellitus without complication (HCC)    Fibroid     SURGICAL HISTORY: No past surgical history on file.  SOCIAL HISTORY: Social History   Socioeconomic History   Marital status: Single    Spouse name: Not on file   Number of children: Not on file   Years of education: Not on file   Highest education level: Not on file  Occupational History  Not on file  Tobacco Use   Smoking status: Never   Smokeless tobacco: Never  Vaping Use   Vaping status: Never Used  Substance and Sexual Activity   Alcohol use: Yes    Comment: Occas   Drug use: No   Sexual activity: Yes    Birth control/protection: Condom  Other Topics Concern   Not on file  Social History Narrative   Not on file   Social Determinants of Health   Financial Resource Strain: Not on file  Food Insecurity: No Food Insecurity (01/11/2022)   [facility]   Hunger Vital Sign    Worried About Running Out of  Food in the Last Year: Never true    Ran Out of Food in the Last Year: Never true  Transportation Needs: No Transportation Needs (01/11/2022)   [facility]   PRAPARE - Transportation    Lack of Transportation (Medical): No    Lack of Transportation (Non-Medical): No  Physical Activity: Not on file  Stress: Not on file  Social Connections: Unknown (02/27/2023)   Received from [facility]   Social Network    Social Network: Not on file  Intimate Partner Violence: Unknown (02/27/2023)   Received from [facility]   HITS    Physically Hurt: Not on file    Insult or Talk Down To: Not on file    Threaten Physical Harm: Not on file    Scream or Curse: Not on file    FAMILY HISTORY Family History  Problem Relation Age of Onset   Diabetes Mother    Hypertension Mother    Hypertension Father    Cancer Sister        Colon cancer   Diabetes Sister    Diabetes Maternal Aunt    Diabetes Maternal Uncle    Heart disease Maternal Grandmother    Diabetes Maternal Grandmother    Stroke Maternal Grandmother     ALLERGIES:  is allergic to lisinopril, metformin hcl, and shellfish allergy.  MEDICATIONS:  Current Outpatient Medications  Medication Sig Dispense Refill   atorvastatin (LIPITOR) 10 MG tablet Take 10 mg by mouth daily.     Continuous Glucose Sensor (DEXCOM G6 SENSOR) MISC SMARTSIG:1 Topical Every 10 Days     Continuous Glucose Transmitter (DEXCOM G6 TRANSMITTER) MISC CHANGE EVERY 90 DAYS     hydrochlorothiazide (HYDRODIURIL) 12.5 MG tablet Take 12.5 mg by mouth daily.     hydrOXYzine (ATARAX) 50 MG tablet Take 50 mg by mouth daily.     Insulin Aspart FlexPen (NOVOLOG) 100 UNIT/ML See admin instructions.     losartan (COZAAR) 50 MG tablet Take 50 mg by mouth daily.     nystatin cream (MYCOSTATIN) Apply 1 Application topically 2 (two) times daily. Apply to affected area BID for up to 7 days. 30 g 0   venlafaxine (EFFEXOR) 75 MG tablet Take 75 mg by  mouth daily.     No current facility-administered medications for this visit.    PHYSICAL EXAMINATION:  ECOG PERFORMANCE STATUS: 1 - Symptomatic but completely ambulatory   There were no vitals filed for this visit.   There were no vitals filed for this visit.    Physical Exam Vitals and nursing note reviewed.  Constitutional:      General: She is not in acute distress.    Appearance: Normal appearance. She is obese. She is not ill-appearing, toxic-appearing or diaphoretic.  HENT:     Head: Normocephalic and  atraumatic.     Right Ear: External ear normal.     Left Ear: External ear normal.     Nose: Nose normal. No congestion or rhinorrhea.  Eyes:     General: No scleral icterus.    Extraocular Movements: Extraocular movements intact.     Conjunctiva/sclera: Conjunctivae normal.     Pupils: Pupils are equal, round, and reactive to light.  Cardiovascular:     Rate and Rhythm: Normal rate and regular rhythm.     Heart sounds: No murmur heard.    No friction rub. No gallop.  Pulmonary:     Effort: Pulmonary effort is normal. No respiratory distress.     Breath sounds: Normal breath sounds. No wheezing or rales.  Abdominal:     General: Bowel sounds are normal.     Palpations: Abdomen is soft.     Tenderness: There is no abdominal tenderness. There is no guarding.  Musculoskeletal:        General: No swelling, tenderness or deformity.     Cervical back: Normal range of motion and neck supple. No rigidity or tenderness.  Lymphadenopathy:     Head:     Right side of head: No submental, submandibular, tonsillar, preauricular, posterior auricular or occipital adenopathy.     Left side of head: No submental, submandibular, tonsillar, preauricular, posterior auricular or occipital adenopathy.     Cervical: No cervical adenopathy.     Right cervical: No superficial, deep or posterior cervical adenopathy.    Left cervical: No superficial, deep or posterior cervical adenopathy.      Upper Body:     Right upper body: No supraclavicular, axillary, pectoral or epitrochlear adenopathy.     Left upper body: No supraclavicular, axillary, pectoral or epitrochlear adenopathy.  Skin:    General: Skin is warm.     Coloration: Skin is not jaundiced.     Findings: No bruising.  Neurological:     General: No focal deficit present.     Mental Status: She is alert and oriented to person, place, and time.     Cranial Nerves: No cranial nerve deficit.  Psychiatric:        Mood and Affect: Mood normal.        Behavior: Behavior normal.        Thought Content: Thought content normal.        Judgment: Judgment normal.     LABORATORY DATA: I have personally reviewed the data as listed:  No visits with results within 1 Month(s) from this visit.  Latest known visit with results is:  Appointment on 12/21/2022  Component Date Value Ref Range Status   Ferritin 12/21/2022 120  11 - 307 ng/mL Final   Performed at [facility], [address] [place], [place] [address]   Vitamin B-12 12/21/2022 716  180 - 914 pg/mL Final   Comment: (NOTE) This assay is not validated for testing neonatal or myeloproliferative syndrome specimens for Vitamin B12 levels. Performed at [facility], [address] [place], [place] [address]    Folate 12/21/2022 7.4  >5.9 ng/mL Final   Performed at [facility], [address], [place], [place] [address]   WBC Count 12/21/2022 8.6  4.0 - 10.5 K/uL Final   RBC 12/21/2022 5.25 (H)  3.87 - 5.11 MIL/uL Final   Hemoglobin 12/21/2022 11.7 (L)  12.0 - 15.0 g/dL Final   HCT 91/47/8295 36.3  36.0 - 46.0 % Final   MCV 12/21/2022 69.1 (  L)  80.0 - 100.0 fL Final   MCH 12/21/2022 22.3 (L)  26.0 - 34.0 pg Final   MCHC 12/21/2022 32.2  30.0 - 36.0 g/dL Final   RDW 16/03/9603 21.7 (H)  11.5 - 15.5 % Final   Platelet Count 12/21/2022 327  150 - 400 K/uL Final   nRBC 12/21/2022 0.0  0.0 - 0.2 % Final    Neutrophils Relative % 12/21/2022 58  % Final   Neutro Abs 12/21/2022 5.1  1.7 - 7.7 K/uL Final   Lymphocytes Relative 12/21/2022 31  % Final   Lymphs Abs 12/21/2022 2.6  0.7 - 4.0 K/uL Final   Monocytes Relative 12/21/2022 5  % Final   Monocytes Absolute 12/21/2022 0.5  0.1 - 1.0 K/uL Final   Eosinophils Relative 12/21/2022 5  % Final   Eosinophils Absolute 12/21/2022 0.4  0.0 - 0.5 K/uL Final   Basophils Relative 12/21/2022 1  % Final   Basophils Absolute 12/21/2022 0.0  0.0 - 0.1 K/uL Final   WBC Morphology 12/21/2022 MORPHOLOGY UNREMARKABLE   Final   Smear Review 12/21/2022 Normal platelet morphology   Final   Immature Granulocytes 12/21/2022 0  % Final   Abs Immature Granulocytes 12/21/2022 0.02  0.00 - 0.07 K/uL Final   Polychromasia 12/21/2022 PRESENT   Final   Performed at [facility], [address] [place], [place] [address]    RADIOGRAPHIC STUDIES: I have personally reviewed the radiological images as listed and agree with the findings in the report  No results found.  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  Can hold on IV iron; continue oral iron therapy    Dysfunctional uterine bleeding:  This is characterized by menses that are prolonged, irregular as well as by heavy bleeding with passage of clots.   Nov 09 2022- PT, PTT, Fibrinogen, von Willebrand screen normal  November 29 2022: Gynecology follow-up visit-hysterectomy recommended given 20-week size fibroid uterus  enlarging and symptomatic.     January 18 2023: Consult at [facility] for consideration of fibroid removal   Not obtained due to insurance reasons  March 18 2023- Encouraged patient to see care with Gynecologist in her insurance network   Hemoglobin C trait (Hgb AC):  Hemoglobin C trait (Hgb AC):   Hgb C results from a point mutation in HBB that changes glutamic acid at amino acid 7 in the beta chain to a lysine (p.Glu7Lys; c.19G>A).   The point mutation affects the same DNA codon as the sickle mutation (Hb S; p.Glu7Val) .  Heterozygosity for Hb C causes Hb C trait (Hgb AC) , a carrier state that is essentially asymptomatic. However, preconception counseling and partner testing is important; if the partner also carries any HBB variant, the children may be more severely affected      Cancer Staging  No matching staging information was found for the patient.    No problem-specific Assessment & Plan notes found for this encounter.    No orders of the  defined types were placed in this encounter.   30  minutes was spent in patient care.  This included time spent preparing to see the patient (e.g., review of tests), obtaining and/or reviewing separately obtained history, counseling and educating the patient/family/caregiver, ordering medications, tests, or procedures; documenting clinical information in the electronic or other health record, independently interpreting results and communicating results to the patient/family/caregiver as well as coordination of care.       All questions were answered. The patient knows to call the clinic with any problems, questions or concerns.  This note was electronically signed.    Loni Muse, MD  03/18/2023 8:59 AM

## 2023-06-18 ENCOUNTER — Inpatient Hospital Stay: Payer: 59 | Admitting: Oncology

## 2023-06-18 ENCOUNTER — Inpatient Hospital Stay: Payer: 59

## 2023-06-20 ENCOUNTER — Inpatient Hospital Stay: Payer: 59

## 2023-06-20 ENCOUNTER — Inpatient Hospital Stay: Payer: 59 | Attending: Oncology | Admitting: Oncology

## 2023-06-20 VITALS — BP 122/76 | HR 95 | Temp 98.8°F | Resp 18 | Ht 65.75 in | Wt 290.1 lb

## 2023-06-20 DIAGNOSIS — D539 Nutritional anemia, unspecified: Secondary | ICD-10-CM

## 2023-06-20 DIAGNOSIS — N938 Other specified abnormal uterine and vaginal bleeding: Secondary | ICD-10-CM | POA: Diagnosis not present

## 2023-06-20 DIAGNOSIS — D500 Iron deficiency anemia secondary to blood loss (chronic): Secondary | ICD-10-CM | POA: Insufficient documentation

## 2023-06-20 DIAGNOSIS — D582 Other hemoglobinopathies: Secondary | ICD-10-CM | POA: Diagnosis not present

## 2023-06-20 DIAGNOSIS — G4733 Obstructive sleep apnea (adult) (pediatric): Secondary | ICD-10-CM

## 2023-06-20 LAB — FOLATE: Folate: 9.8 ng/mL (ref >5.9)

## 2023-06-20 LAB — CBC WITH DIFFERENTIAL/PLATELET
Abs Immature Granulocytes: 0.02 10*3/uL (ref 0.00–0.07)
Basophils Absolute: 0 10*3/uL (ref 0.0–0.1)
Basophils Relative: 0 %
Eosinophils Absolute: 0.3 10*3/uL (ref 0.0–0.5)
Eosinophils Relative: 3 %
HCT: 36.1 % (ref 36.0–46.0)
Hemoglobin: 12.4 g/dL (ref 12.0–15.0)
Immature Granulocytes: 0 %
Lymphocytes Relative: 34 %
Lymphs Abs: 3.1 10*3/uL (ref 0.7–4.0)
MCH: 24 pg — ABNORMAL LOW (ref 26.0–34.0)
MCHC: 34.3 g/dL (ref 30.0–36.0)
MCV: 70 fL — ABNORMAL LOW (ref 80.0–100.0)
Monocytes Absolute: 0.8 10*3/uL (ref 0.1–1.0)
Monocytes Relative: 8 %
Neutro Abs: 5.1 10*3/uL (ref 1.7–7.7)
Neutrophils Relative %: 55 %
Platelets: 341 10*3/uL (ref 150–400)
RBC: 5.16 MIL/uL — ABNORMAL HIGH (ref 3.87–5.11)
RDW: 15.1 % (ref 11.5–15.5)
WBC: 9.2 10*3/uL (ref 4.0–10.5)
nRBC: 0 % (ref 0.0–0.2)
nRBC: 0 /100{WBCs}

## 2023-06-20 LAB — VITAMIN B12: Vitamin B-12: 759 pg/mL (ref 180–914)

## 2023-06-20 LAB — FERRITIN: Ferritin: 14 ng/mL (ref 11–307)

## 2023-06-20 NOTE — Progress Notes (Signed)
 [facility] Cancer Initial Visit:  Patient Care Team: System, Provider Not In as PCP - General  CHIEF COMPLAINTS/PURPOSE OF CONSULTATION:  HISTORY OF PRESENTING ILLNESS: Patricia Bell 43 y.o. female is here because of anemia Medical history notable for diabetes mellitus type 1, uterine fibroids  August 11, 2021: Hemoglobin A1c 14.0  October 02, 2022: WBC 9.5 hemoglobin 9.7 MCV 68 platelet count 327.  Ferritin 7  Nov 09 2022:  [facility] Hematology Consult  Patient is G0 P0.  Menopause not reached.  Menses occur every 28 days and last about 10 days and regular but very heavy.  Does not have bleeding between periods.    Patient has a history of uterine fibroids.   Took oral iron  for about a month and tolerated it.  Has never received IV iron / required PRBC's in the past.   No history of surgery.  No hematochezia, melena, hemoptysis, hematuria.  No history of intra-articular or soft tissue bleeding.  No history of abnormal bleeding in family members Patient has symptoms of fatigue, pallor,  DOE, decreased performance status.  Patient does not have PICA to ice but does to starch/dirt.  Followed by Gynecology and they are working on hormone therapy.  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  Tobacco none.  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  Other therapeutic options were also discussed  December 21 2022:  Scheduled follow up for anemia.  Reviewed results of labs with patient.  Feels better since receiving IV iron  but still a bit fatigued.  Discussed Hgb AC with patient.  Will have her begin low dose iron .   Still having episodes of hyperglycemia.   Will check ferritin today and if still low arrange for additional IV iron   WBC 8.6 hemoglobin 11.7 MCV 69 platelet count 327; differential normal Ferritin 128 folate 74 B12 716  January 18 2023:  Consult at [facility] for consideration of fibroid removal.  Did not occur because patient was considered out of network.    March 18, 2023:   Has gained 4 lbs.  Has been unable to see [facility] Gynecology because of insurance issues.  Feels dizzy at times.  FSBG's now under 200.    Hgb 13.0 Ferritin 16 Folate 9.7 Glu 167  May 21 2023:  Gynecology follow up.  she desires conservative management and all questions were answered. Her HgbA1c was 10.6 on 05/07/2023 and she understands that normal blood sugars and HgbA1c of 6 is necessary to decrease infection and promote tissue healing postoperatively. She will notify the office when her HgbA1c reaches goal.   June 10 2023:  Sleep study.  Severe obstructive sleep apnea by frequency of respiratory events, with severe oxygen desaturations.  LowSaO2 was 78%, with 9.8 mins < - = 88%.   June 20 2023:  Scheduled follow-up regarding anemia.  Discussed results of sleep study and CPAP machines/masks.  Reviewed results of Gynecology follow up visit.  Endocrinology to place patient on Monjaro to help with hyperglycemia and weight loss.    Review of Systems - Oncology  MEDICAL HISTORY: Past Medical History:  Diagnosis Date   Abnormal uterine bleeding    Diabetes mellitus without complication (HCC)    Fibroid     SURGICAL HISTORY: No past surgical history on file.  SOCIAL HISTORY: Social History   Socioeconomic History   Marital status:  Single    Spouse name: Not on file   Number of children: Not on file   Years of education: Not on file   Highest education level: Not on file  Occupational History   Not on file  Tobacco Use   Smoking status: Never   Smokeless tobacco: Never  Vaping Use   Vaping status: Never Used  Substance and Sexual Activity   Alcohol use: Yes    Comment: Occas   Drug use: No   Sexual activity: Yes    Birth control/protection: Condom  Other Topics Concern   Not on file  Social History Narrative   Not on file   Social Drivers of Health   Financial Resource Strain: Not on file  Food Insecurity: Low Risk  (05/06/2023)   Received from [facility]   Hunger Vital Sign    Worried About Running Out of Food in the Last Year: Never true    Ran Out of Food in the Last Year: Never true  Transportation Needs: No Transportation Needs (05/06/2023)   Received from [facility]    In the past 12 months, has lack of reliable transportation kept you from medical appointments, meetings, work or from getting things needed for daily living? : No  Physical Activity: Not on file  Stress: Not on file  Social Connections: Unknown (02/27/2023)   Received from [facility]   Social Network    Social Network: Not on file  Intimate Partner Violence: Unknown (02/27/2023)   Received from [facility]   HITS    Physically Hurt: Not on file    Insult or Talk Down To: Not on file    Threaten Physical Harm: Not on file    Scream or Curse: Not on file    FAMILY HISTORY Family History  Problem Relation Age of Onset   Diabetes Mother    Hypertension Mother    Hypertension Father    Cancer Sister        Colon cancer   Diabetes Sister    Diabetes Maternal Aunt    Diabetes Maternal Uncle    Heart disease Maternal Grandmother    Diabetes Maternal Grandmother    Stroke Maternal Grandmother     ALLERGIES:  is allergic to lisinopril, metformin  hcl, and shellfish allergy.  MEDICATIONS:   Current Outpatient Medications  Medication Sig Dispense Refill   atorvastatin (LIPITOR) 10 MG tablet Take 10 mg by mouth daily.     Continuous Glucose Sensor (DEXCOM G6 SENSOR) MISC SMARTSIG:1 Topical Every 10 Days     Continuous Glucose Transmitter (DEXCOM G6 TRANSMITTER) MISC CHANGE EVERY 90 DAYS     hydrochlorothiazide (HYDRODIURIL) 12.5 MG tablet Take 12.5 mg by mouth daily.     hydrOXYzine (ATARAX) 50 MG tablet Take 50 mg by mouth daily.     Insulin  Aspart FlexPen (NOVOLOG) 100 UNIT/ML See admin instructions.     losartan (COZAAR) 50 MG tablet Take 50 mg by mouth daily.  nystatin  cream (MYCOSTATIN ) Apply 1 Application topically 2 (two) times daily. Apply to affected area BID for up to 7 days. 30 g 0   venlafaxine (EFFEXOR) 75 MG tablet Take 75 mg by mouth daily.     No current facility-administered medications for this visit.    PHYSICAL EXAMINATION:  ECOG PERFORMANCE STATUS: 1 - Symptomatic but completely ambulatory   Vitals:   06/20/23 1119  BP: 122/76  Pulse: 95  Resp: 18  Temp: 98.8 F (37.1 C)  SpO2: 98%     Filed Weights   06/20/23 1119  Weight: 290 lb 1.6 oz (131.6 kg)      Physical Exam Vitals and nursing note reviewed.  Constitutional:      General: She is not in acute distress.    Appearance: Normal appearance. She is obese. She is not ill-appearing, toxic-appearing or diaphoretic.  HENT:     Head: Normocephalic and atraumatic.     Right Ear: External ear normal.     Left Ear: External ear normal.     Nose: Nose normal. No congestion or rhinorrhea.  Eyes:     General: No scleral icterus.    Extraocular Movements: Extraocular movements intact.     Conjunctiva/sclera: Conjunctivae normal.     Pupils: Pupils are equal, round, and reactive to light.  Cardiovascular:     Rate and Rhythm: Normal rate and regular rhythm.     Heart sounds: No murmur heard.    No friction rub. No gallop.  Pulmonary:     Effort: Pulmonary effort is normal. No  respiratory distress.     Breath sounds: Normal breath sounds. No wheezing or rales.  Abdominal:     General: Bowel sounds are normal.     Palpations: Abdomen is soft.     Tenderness: There is no abdominal tenderness. There is no guarding.  Musculoskeletal:        General: No swelling, tenderness or deformity.     Cervical back: Normal range of motion and neck supple. No rigidity or tenderness.  Lymphadenopathy:     Head:     Right side of head: No submental, submandibular, tonsillar, preauricular, posterior auricular or occipital adenopathy.     Left side of head: No submental, submandibular, tonsillar, preauricular, posterior auricular or occipital adenopathy.     Cervical: No cervical adenopathy.     Right cervical: No superficial, deep or posterior cervical adenopathy.    Left cervical: No superficial, deep or posterior cervical adenopathy.     Upper Body:     Right upper body: No supraclavicular, axillary, pectoral or epitrochlear adenopathy.     Left upper body: No supraclavicular, axillary, pectoral or epitrochlear adenopathy.  Skin:    General: Skin is warm.     Coloration: Skin is not jaundiced.     Findings: No bruising.  Neurological:     General: No focal deficit present.     Mental Status: She is alert and oriented to person, place, and time.     Cranial Nerves: No cranial nerve deficit.  Psychiatric:        Mood and Affect: Mood normal.        Behavior: Behavior normal.        Thought Content: Thought content normal.        Judgment: Judgment normal.      LABORATORY DATA: I have personally reviewed the data as listed:  No visits with results within 1 Month(s) from this visit.  Latest known visit with results is:  Office Visit on 03/18/2023  Component Date Value Ref Range Status   Sodium 03/18/2023 140  135 - 145 mmol/L Final   Potassium 03/18/2023 4.1  3.5 - 5.1 mmol/L Final   Chloride 03/18/2023 106  98 - 111 mmol/L Final   CO2 03/18/2023 25  22 - 32  mmol/L Final   Glucose, Bld 03/18/2023 167 (H)  70 - 99 mg/dL Final   Glucose reference range applies only to samples taken after fasting for at least 8 hours.   BUN 03/18/2023 15  6 - 20 mg/dL Final   Creatinine, Ser 03/18/2023 0.75  0.44 - 1.00 mg/dL Final   Calcium 89/92/7975 8.8 (L)  8.9 - 10.3 mg/dL Final   Total Protein 89/92/7975 7.1  6.5 - 8.1 g/dL Final   Albumin 89/92/7975 3.7  3.5 - 5.0 g/dL Final   AST 89/92/7975 15  15 - 41 U/L Final   ALT 03/18/2023 23  0 - 44 U/L Final   Alkaline Phosphatase 03/18/2023 75  38 - 126 U/L Final   Total Bilirubin 03/18/2023 0.5  0.3 - 1.2 mg/dL Final   GFR, Estimated 03/18/2023 >60  >60 mL/min Final   Comment: (NOTE) Calculated using the CKD-EPI Creatinine Equation (2021)    Anion gap 03/18/2023 9  5 - 15 Final   Performed at [facility], [address], [place], [place] [address]   Folate 03/18/2023 9.7  >5.9 ng/mL Final   Performed at [facility], [address] [place], [place] [address]   Vitamin B-12 03/18/2023 742  180 - 914 pg/mL Final   Comment: (NOTE) This assay is not validated for testing neonatal or myeloproliferative syndrome specimens for Vitamin B12 levels. Performed at [facility], [address] [place], [place] [address]    Ferritin 03/18/2023 16  11 - 307 ng/mL Final   Performed at [facility], [address], [place], [place] [address]   WBC 03/18/2023 7.2  4.0 - 10.5 K/uL Final   RBC 03/18/2023 5.36 (H)  3.87 - 5.11 MIL/uL Final   Hemoglobin 03/18/2023 13.0  12.0 - 15.0 g/dL Final   HCT 89/92/7975 39.5  36.0 - 46.0 % Final   MCV 03/18/2023 73.7 (L)  80.0 - 100.0 fL Final   MCH 03/18/2023 24.3 (L)  26.0 - 34.0 pg Final   MCHC 03/18/2023 32.9  30.0 - 36.0 g/dL Final   RDW 89/92/7975 16.2 (H)  11.5 - 15.5 % Final   Platelets 03/18/2023 281  150 - 400 K/uL Final   nRBC 03/18/2023 0.0  0.0 - 0.2 % Final   Neutrophils Relative %  03/18/2023 50  % Final   Neutro Abs 03/18/2023 3.6  1.7 - 7.7 K/uL Final   Lymphocytes Relative 03/18/2023 36  % Final   Lymphs Abs 03/18/2023 2.6  0.7 - 4.0 K/uL Final   Monocytes Relative 03/18/2023 9  % Final   Monocytes Absolute 03/18/2023 0.6  0.1 - 1.0 K/uL Final   Eosinophils Relative 03/18/2023 4  % Final   Eosinophils Absolute 03/18/2023 0.3  0.0 - 0.5 K/uL Final   Basophils Relative 03/18/2023 0  % Final   Basophils Absolute 03/18/2023 0.0  0.0 - 0.1 K/uL Final   Immature Granulocytes 03/18/2023 1  % Final   Abs Immature Granulocytes 03/18/2023 0.04  0.00 - 0.07 K/uL Final   Performed at [facility], [address], [place], [place] [address]    RADIOGRAPHIC STUDIES: I have personally  reviewed the radiological images as listed and agree with the findings in the report  No results found.  ASSESSMENT/PLAN  Patient is a year old female with symptomatic microcytic anemia presumed to be secondary to dysfunctional uterine bleeding  Anemia:  Etiology is multifactorial - 1) Iron  deficiency anemia owing to dysfunctional uterine bleeding/ exacerbated by high demand owing to prior pregnancies 2) Hgb C trait  November 15, 2022 through December 14, 2022: Received total of 1000 mg of Venofer  and 2 doses of vitamin B12 1000 mcg subcu   December 21 2022- Hgb 11.7 MCV 69 Ferritin 120  March 18 2023- Hgb 13.0 Ferritin 16 Folate 9.7.  Can hold on IV iron ; continue oral iron  therapy  June 20 2023- Hgb 12.4.  Awaiting results of Ferritin and folate.  Continue oral iron  therapy.      Dysfunctional uterine bleeding:  This is characterized by menses that are prolonged, irregular as well as by heavy bleeding with passage of clots.   Nov 09 2022- PT, PTT, Fibrinogen, von Willebrand screen normal  November 29 2022: Gynecology follow-up visit-hysterectomy recommended given 20-week size fibroid uterus enlarging and symptomatic.     January 18 2023: Consult at [facility] for consideration of fibroid  removal   Not obtained due to insurance reasons  March 18 2023- Encouraged patient to see care with Gynecologist in her insurance network May 21 2023:  Gynecology follow up.  Myomectomy on hold until patient achieves normal blood sugars and HgbA1c of 6 is necessary to decrease infection and promote tissue healing postoperatively. She will notify the office when her HgbA1c reaches goal.    Hemoglobin C trait (Hgb AC):  Hemoglobin C trait (Hgb AC):   Hgb C results from a point mutation in HBB that changes glutamic acid at amino acid 7 in the beta chain to a lysine (p.Glu7Lys; c.19G>A).   The point mutation affects the same DNA codon as the sickle mutation (Hb S; p.Glu7Val) .  Heterozygosity for Hb C causes Hb C trait (Hgb AC) , a carrier state that is essentially asymptomatic. However, preconception counseling and partner testing is important; if the partner also carries any HBB variant, the children may be more severely affected  Obstructive sleep apnea June 10 2023- Sleep study.  Severe obstructive sleep apnea by frequency of respiratory events, with severe oxygen desaturations.  LowSaO2 was 78%, with 9.8 mins  <- = 88%  June 20 2023- Awaiting CPAP machine  DM Type II and metabolic syndrome  June 20 2023- Driving force behind most of patient's health issues.  On insulin .  To start Monjaro     Cancer Staging  No matching staging information was found for the patient.    No problem-specific Assessment & Plan notes found for this encounter.    No orders of the defined types were placed in this encounter.   30  minutes was spent in patient care.  This included time spent preparing to see the patient (e.g., review of tests), obtaining and/or reviewing separately obtained history, counseling and educating the patient/family/caregiver, ordering medications, tests, or procedures; documenting clinical information in the electronic or other health record, independently interpreting  results and communicating results to the patient/family/caregiver as well as coordination of care.       All questions were answered. The patient knows to call the clinic with any problems, questions or concerns.  This note was electronically signed.    Guillermina JAYSON Perla, MD  06/20/2023 11:21 AM

## 2023-09-18 ENCOUNTER — Other Ambulatory Visit: Payer: 59

## 2023-09-18 ENCOUNTER — Ambulatory Visit: Payer: 59 | Admitting: Hematology and Oncology

## 2023-09-19 ENCOUNTER — Inpatient Hospital Stay: Admitting: Hematology and Oncology

## 2023-09-19 ENCOUNTER — Inpatient Hospital Stay

## 2023-09-25 ENCOUNTER — Other Ambulatory Visit: Payer: Self-pay | Admitting: Hematology and Oncology

## 2023-09-25 DIAGNOSIS — D539 Nutritional anemia, unspecified: Secondary | ICD-10-CM

## 2023-09-26 ENCOUNTER — Inpatient Hospital Stay: Attending: Oncology

## 2023-09-26 ENCOUNTER — Inpatient Hospital Stay (HOSPITAL_BASED_OUTPATIENT_CLINIC_OR_DEPARTMENT_OTHER): Admitting: Hematology and Oncology

## 2023-09-26 ENCOUNTER — Encounter: Payer: Self-pay | Admitting: Hematology and Oncology

## 2023-09-26 VITALS — BP 130/72 | HR 90 | Temp 97.5°F | Resp 20 | Ht 65.75 in | Wt 278.9 lb

## 2023-09-26 DIAGNOSIS — D500 Iron deficiency anemia secondary to blood loss (chronic): Secondary | ICD-10-CM

## 2023-09-26 DIAGNOSIS — D539 Nutritional anemia, unspecified: Secondary | ICD-10-CM

## 2023-09-26 DIAGNOSIS — N938 Other specified abnormal uterine and vaginal bleeding: Secondary | ICD-10-CM | POA: Diagnosis present

## 2023-09-26 LAB — FERRITIN: Ferritin: 15 ng/mL (ref 11–307)

## 2023-09-26 LAB — CBC WITH DIFFERENTIAL (CANCER CENTER ONLY)
Abs Immature Granulocytes: 0.78 10*3/uL — ABNORMAL HIGH (ref 0.00–0.07)
Basophils Absolute: 0.1 10*3/uL (ref 0.0–0.1)
Basophils Relative: 0 %
Eosinophils Absolute: 1.7 10*3/uL — ABNORMAL HIGH (ref 0.0–0.5)
Eosinophils Relative: 10 %
HCT: 31 % — ABNORMAL LOW (ref 36.0–46.0)
Hemoglobin: 10.2 g/dL — ABNORMAL LOW (ref 12.0–15.0)
Immature Granulocytes: 5 %
Lymphocytes Relative: 22 %
Lymphs Abs: 3.6 10*3/uL (ref 0.7–4.0)
MCH: 21.9 pg — ABNORMAL LOW (ref 26.0–34.0)
MCHC: 32.9 g/dL (ref 30.0–36.0)
MCV: 66.7 fL — ABNORMAL LOW (ref 80.0–100.0)
Monocytes Absolute: 0.8 10*3/uL (ref 0.1–1.0)
Monocytes Relative: 5 %
Neutro Abs: 9.1 10*3/uL — ABNORMAL HIGH (ref 1.7–7.7)
Neutrophils Relative %: 58 %
Platelet Count: 336 10*3/uL (ref 150–400)
RBC: 4.65 MIL/uL (ref 3.87–5.11)
RDW: 17.7 % — ABNORMAL HIGH (ref 11.5–15.5)
WBC Count: 16 10*3/uL — ABNORMAL HIGH (ref 4.0–10.5)
nRBC: 0 % (ref 0.0–0.2)
nRBC: 0 /100{WBCs}

## 2023-09-26 LAB — IRON AND TIBC
Iron: 33 ug/dL (ref 28–170)
Saturation Ratios: 11 % (ref 10.4–31.8)
TIBC: 295 ug/dL (ref 250–450)
UIBC: 262 ug/dL

## 2023-09-26 LAB — FOLATE: Folate: 5.7 ng/mL — ABNORMAL LOW (ref >5.9)

## 2023-09-26 LAB — VITAMIN B12: Vitamin B-12: 447 pg/mL (ref 180–914)

## 2023-09-26 NOTE — Progress Notes (Cosign Needed Addendum)
 [facility]  [address] [place],  [place]  [address] [phone]   Addendum: Her ferritin remained under 30, so due to the worsening anemia, I recommend she receive IV iron  in the form of Venofer  in the upcoming days.  Folate was borderline low, so I will have her start folic acid  400 mcg daily.  B12 was normal.  Clinic Day:  09/26/2023  Referring physician: [place] Hammans, MD   HISTORY OF PRESENT ILLNESS:  The patient is a 43 y.o. female with iron  deficiency anemia due to dysfunctional uterine bleeding.  She also has hemoglobin C trait, which accounts for RBC microcytosis.  She had an excellent response to IV iron  in June 2024.  She is here today for repeat clinical assessment.  She reports increased fatigue, as well as intermittent dizziness, concerning for recurrent anemia.  She continues to have heavy menses.  She states she has a large uterine fibroid and surgery has been recommended.  However, her providers are working on decreasing her hemoglobin A1c to help with postoperative healing.  PHYSICAL EXAM:  Blood pressure 130/72, pulse 90, temperature (!) 97.5 F (36.4 C), temperature source Oral, resp. rate 20, height 5' 5.75" (1.67 m), weight 278 lb 14.4 oz (126.5 kg), last menstrual period 09/24/2023, SpO2 100%. Wt Readings from Last 3 Encounters:  09/26/23 278 lb 14.4 oz (126.5 kg)  06/20/23 290 lb 1.6 oz (131.6 kg)  03/18/23 288 lb (130.6 kg)   Body mass index is 45.36 kg/m.  Performance status (ECOG): 1 - Symptomatic but completely ambulatory  Physical Exam Vitals and nursing note reviewed.  Constitutional:      General: She is not in acute distress.    Appearance: Normal appearance.  HENT:     Head: Normocephalic and atraumatic.     Mouth/Throat:     Mouth: Mucous membranes are moist.     Pharynx: Oropharynx is clear. No oropharyngeal exudate or posterior oropharyngeal erythema.  Eyes:     General: No scleral icterus.    Extraocular  Movements: Extraocular movements intact.     Conjunctiva/sclera: Conjunctivae normal.     Pupils: Pupils are equal, round, and reactive to light.  Cardiovascular:     Rate and Rhythm: Normal rate and regular rhythm.     Heart sounds: Normal heart sounds. No murmur heard.    No friction rub. No gallop.  Pulmonary:     Effort: Pulmonary effort is normal.     Breath sounds: Normal breath sounds. No wheezing, rhonchi or rales.  Abdominal:     General: There is no distension.     Palpations: Abdomen is soft. There is no hepatomegaly, splenomegaly or mass.     Tenderness: There is no abdominal tenderness.     Comments: Uterus is palpable above the umbilicus and firm  Musculoskeletal:        General: Normal range of motion.     Cervical back: Normal range of motion and neck supple. No tenderness.     Right lower leg: No edema.     Left lower leg: No edema.  Lymphadenopathy:     Cervical: No cervical adenopathy.     Upper Body:     Right upper body: No supraclavicular or axillary adenopathy.     Left upper body: No supraclavicular or axillary adenopathy.     Lower Body: No right inguinal adenopathy. No left inguinal adenopathy.  Skin:    General: Skin is warm and dry.     Coloration:  Skin is not jaundiced.     Findings: No rash.  Neurological:     Mental Status: She is alert and oriented to person, place, and time.     Cranial Nerves: No cranial nerve deficit.  Psychiatric:        Mood and Affect: Mood normal.        Behavior: Behavior normal.        Thought Content: Thought content normal.     LABS:      Latest Ref Rng & Units 09/26/2023    2:57 PM 06/20/2023   12:02 PM 03/18/2023   10:21 AM  CBC  WBC 4.0 - 10.5 K/uL 16.0  9.2  7.2   Hemoglobin 12.0 - 15.0 g/dL 30.8  65.7  84.6   Hematocrit 36.0 - 46.0 % 31.0  36.1  39.5   Platelets 150 - 400 K/uL 336  341  281       Latest Ref Rng & Units 03/18/2023   10:21 AM 01/23/2016    2:00 PM 03/09/2015    1:25 PM  CMP  Glucose 70  - 99 mg/dL 962  952  841   BUN 6 - 20 mg/dL 15  18  18    Creatinine 0.44 - 1.00 mg/dL 3.24  4.01  0.27   Sodium 135 - 145 mmol/L 140  137  138   Potassium 3.5 - 5.1 mmol/L 4.1  4.2  4.0   Chloride 98 - 111 mmol/L 106  102  105   CO2 22 - 32 mmol/L 25  26  27    Calcium 8.9 - 10.3 mg/dL 8.8  9.9  8.9   Total Protein 6.5 - 8.1 g/dL 7.1  6.7    Total Bilirubin 0.3 - 1.2 mg/dL 0.5  0.3    Alkaline Phos 38 - 126 U/L 75  70    AST 15 - 41 U/L 15  9    ALT 0 - 44 U/L 23  13       No results found for: "CEA1", "CEA" / No results found for: "CEA1", "CEA" No results found for: "PSA1" No results found for: "OZD664" No results found for: "CAN125"  No results found for: "TOTALPROTELP", "ALBUMINELP", "A1GS", "A2GS", "BETS", "BETA2SER", "GAMS", "MSPIKE", "SPEI" Lab Results  Component Value Date   FERRITIN 14 06/20/2023   FERRITIN 16 03/18/2023   FERRITIN 120 12/21/2022     Review Flowsheet  More data exists      Latest Ref Rng & Units 12/21/2022 03/18/2023 06/20/2023  Oncology Labs  Ferritin 11 - 307 ng/mL 120  16  14      STUDIES:  No results found.    ASSESSMENT & PLAN:   Assessment/Plan:  43 y.o. female with iron  deficiency anemia due to dysfunctional uterine bleeding.  Although her iron  studies are pending from today, she has recurrent anemia with worsening microcytosis, so I will arrange for her to receive IV iron  again in the upcoming days.  I will plan to see her back in 3 months for repeat clinical assessment.  The patient understands all the plans discussed today and is in agreement with them.  She knows to contact our office if she develops concerns prior to her next appointment.     Alfonso Ike, PA-C   Physician Assistant [facility] [phone]

## 2023-09-27 ENCOUNTER — Telehealth: Payer: Self-pay

## 2023-09-27 NOTE — Telephone Encounter (Signed)
 Detailed message left for patient.

## 2023-10-07 ENCOUNTER — Inpatient Hospital Stay

## 2023-10-07 VITALS — BP 131/77 | HR 83 | Temp 98.1°F | Resp 18 | Ht 65.75 in | Wt 274.0 lb

## 2023-10-07 DIAGNOSIS — D500 Iron deficiency anemia secondary to blood loss (chronic): Secondary | ICD-10-CM | POA: Diagnosis not present

## 2023-10-07 DIAGNOSIS — D539 Nutritional anemia, unspecified: Secondary | ICD-10-CM

## 2023-10-07 MED ORDER — IRON SUCROSE 20 MG/ML IV SOLN
200.0000 mg | Freq: Once | INTRAVENOUS | Status: AC
  Administered 2023-10-07: 200 mg via INTRAVENOUS
  Filled 2023-10-07: qty 10

## 2023-10-07 MED ORDER — SODIUM CHLORIDE 0.9 % IV SOLN: Freq: Once | INTRAVENOUS | Status: DC

## 2023-10-07 NOTE — Patient Instructions (Signed)

## 2023-10-09 ENCOUNTER — Inpatient Hospital Stay

## 2023-10-09 VITALS — BP 130/73 | HR 103 | Temp 98.3°F

## 2023-10-09 DIAGNOSIS — D539 Nutritional anemia, unspecified: Secondary | ICD-10-CM

## 2023-10-09 DIAGNOSIS — D500 Iron deficiency anemia secondary to blood loss (chronic): Secondary | ICD-10-CM | POA: Diagnosis not present

## 2023-10-09 MED ORDER — IRON SUCROSE 20 MG/ML IV SOLN
200.0000 mg | Freq: Once | INTRAVENOUS | Status: AC
  Administered 2023-10-09: 200 mg via INTRAVENOUS
  Filled 2023-10-09: qty 10

## 2023-10-09 NOTE — Patient Instructions (Signed)

## 2023-10-11 ENCOUNTER — Encounter: Payer: Self-pay | Admitting: Hematology and Oncology

## 2023-10-11 ENCOUNTER — Other Ambulatory Visit: Payer: Self-pay | Admitting: Hematology and Oncology

## 2023-10-11 ENCOUNTER — Inpatient Hospital Stay: Attending: Oncology

## 2023-10-11 VITALS — BP 138/71 | HR 90 | Temp 98.0°F | Resp 18

## 2023-10-11 DIAGNOSIS — D500 Iron deficiency anemia secondary to blood loss (chronic): Secondary | ICD-10-CM | POA: Insufficient documentation

## 2023-10-11 DIAGNOSIS — D539 Nutritional anemia, unspecified: Secondary | ICD-10-CM

## 2023-10-11 DIAGNOSIS — N938 Other specified abnormal uterine and vaginal bleeding: Secondary | ICD-10-CM | POA: Diagnosis present

## 2023-10-11 MED ORDER — SODIUM CHLORIDE 0.9 % IV SOLN: Freq: Once | INTRAVENOUS | Status: DC

## 2023-10-11 MED ORDER — IRON SUCROSE 20 MG/ML IV SOLN
200.0000 mg | Freq: Once | INTRAVENOUS | Status: AC
  Administered 2023-10-11: 200 mg via INTRAVENOUS
  Filled 2023-10-11: qty 10

## 2023-10-11 NOTE — Patient Instructions (Signed)

## 2023-10-14 ENCOUNTER — Inpatient Hospital Stay

## 2023-10-14 VITALS — BP 131/71 | HR 88 | Temp 99.1°F | Resp 18 | Ht 65.75 in | Wt 280.4 lb

## 2023-10-14 DIAGNOSIS — D500 Iron deficiency anemia secondary to blood loss (chronic): Secondary | ICD-10-CM | POA: Diagnosis not present

## 2023-10-14 DIAGNOSIS — D539 Nutritional anemia, unspecified: Secondary | ICD-10-CM

## 2023-10-14 MED ORDER — SODIUM CHLORIDE 0.9% FLUSH
10.0000 mL | Freq: Once | INTRAVENOUS | Status: AC | PRN
  Administered 2023-10-14: 10 mL

## 2023-10-14 MED ORDER — IRON SUCROSE 20 MG/ML IV SOLN
200.0000 mg | Freq: Once | INTRAVENOUS | Status: AC
  Administered 2023-10-14: 200 mg via INTRAVENOUS
  Filled 2023-10-14: qty 10

## 2023-10-14 NOTE — Patient Instructions (Signed)

## 2023-10-16 ENCOUNTER — Ambulatory Visit

## 2023-10-16 ENCOUNTER — Other Ambulatory Visit: Payer: Self-pay | Admitting: Hematology and Oncology

## 2023-10-16 DIAGNOSIS — D500 Iron deficiency anemia secondary to blood loss (chronic): Secondary | ICD-10-CM

## 2023-10-17 ENCOUNTER — Other Ambulatory Visit: Payer: Self-pay | Admitting: Hematology and Oncology

## 2023-10-17 ENCOUNTER — Inpatient Hospital Stay

## 2023-10-17 VITALS — BP 153/78 | HR 90 | Temp 99.3°F | Resp 18

## 2023-10-17 DIAGNOSIS — D500 Iron deficiency anemia secondary to blood loss (chronic): Secondary | ICD-10-CM

## 2023-10-17 DIAGNOSIS — D539 Nutritional anemia, unspecified: Secondary | ICD-10-CM

## 2023-10-17 LAB — CBC WITH DIFFERENTIAL (CANCER CENTER ONLY)
Abs Immature Granulocytes: 0.03 10*3/uL (ref 0.00–0.07)
Basophils Absolute: 0 10*3/uL (ref 0.0–0.1)
Basophils Relative: 0 %
Eosinophils Absolute: 1 10*3/uL — ABNORMAL HIGH (ref 0.0–0.5)
Eosinophils Relative: 12 %
HCT: 32.9 % — ABNORMAL LOW (ref 36.0–46.0)
Hemoglobin: 10.5 g/dL — ABNORMAL LOW (ref 12.0–15.0)
Immature Granulocytes: 0 %
Immature Platelet Fraction: 1.9 % (ref 1.2–8.6)
Lymphocytes Relative: 29 %
Lymphs Abs: 2.5 10*3/uL (ref 0.7–4.0)
MCH: 22.2 pg — ABNORMAL LOW (ref 26.0–34.0)
MCHC: 31.9 g/dL (ref 30.0–36.0)
MCV: 69.7 fL — ABNORMAL LOW (ref 80.0–100.0)
Monocytes Absolute: 0.5 10*3/uL (ref 0.1–1.0)
Monocytes Relative: 6 %
Neutro Abs: 4.5 10*3/uL (ref 1.7–7.7)
Neutrophils Relative %: 53 %
Platelet Count: 308 10*3/uL (ref 150–400)
RBC: 4.72 MIL/uL (ref 3.87–5.11)
RDW: 20.2 % — ABNORMAL HIGH (ref 11.5–15.5)
WBC Count: 8.6 10*3/uL (ref 4.0–10.5)
nRBC: 0 % (ref 0.0–0.2)

## 2023-10-17 LAB — FOLATE: Folate: 6.8 ng/mL (ref >5.9)

## 2023-10-17 LAB — IRON AND TIBC
Iron: 305 ug/dL — ABNORMAL HIGH (ref 28–170)
Saturation Ratios: 102 % — ABNORMAL HIGH (ref 10.4–31.8)
TIBC: 300 ug/dL (ref 250–450)

## 2023-10-17 LAB — CMP (CANCER CENTER ONLY)
ALT: 12 U/L (ref 0–44)
AST: 10 U/L — ABNORMAL LOW (ref 15–41)
Albumin: 3.4 g/dL — ABNORMAL LOW (ref 3.5–5.0)
Alkaline Phosphatase: 100 U/L (ref 38–126)
Anion gap: 11 (ref 5–15)
BUN: 13 mg/dL (ref 6–20)
CO2: 24 mmol/L (ref 22–32)
Calcium: 8.8 mg/dL — ABNORMAL LOW (ref 8.9–10.3)
Chloride: 101 mmol/L (ref 98–111)
Creatinine: 0.97 mg/dL (ref 0.44–1.00)
GFR, Estimated: >60 mL/min (ref >60)
Glucose, Bld: 430 mg/dL — ABNORMAL HIGH (ref 70–99)
Potassium: 4.3 mmol/L (ref 3.5–5.1)
Sodium: 136 mmol/L (ref 135–145)
Total Bilirubin: <0.2 mg/dL (ref 0.0–1.2)
Total Protein: 6.3 g/dL — ABNORMAL LOW (ref 6.5–8.1)

## 2023-10-17 LAB — FERRITIN: Ferritin: 177 ng/mL (ref 11–307)

## 2023-10-17 LAB — VITAMIN B12: Vitamin B-12: 390 pg/mL (ref 180–914)

## 2023-10-17 MED ORDER — IRON SUCROSE 20 MG/ML IV SOLN
200.0000 mg | Freq: Once | INTRAVENOUS | Status: AC
  Administered 2023-10-17: 200 mg via INTRAVENOUS
  Filled 2023-10-17: qty 10

## 2023-10-17 NOTE — Progress Notes (Addendum)
 Patient declined to stay for post venofer  monitoring. VSS and ambulatory at discharge without complaint.  Patient stated that her insulin  pump was not properly in place yesterday so her blood sugars have been elevated.

## 2023-10-17 NOTE — Patient Instructions (Signed)

## 2023-12-26 ENCOUNTER — Inpatient Hospital Stay: Attending: Oncology | Admitting: Hematology and Oncology

## 2023-12-26 ENCOUNTER — Inpatient Hospital Stay
